# Patient Record
Sex: Female | Born: 1950 | Race: Black or African American | Hispanic: No | Smoking: Never smoker
Health system: Southern US, Community
[De-identification: ages and names within clinical notes are randomized; demographics above are authoritative.]

## PROBLEM LIST (undated history)

## (undated) DIAGNOSIS — E119 Type 2 diabetes mellitus without complications: Secondary | ICD-10-CM

## (undated) DIAGNOSIS — M1990 Unspecified osteoarthritis, unspecified site: Secondary | ICD-10-CM

## (undated) DIAGNOSIS — K219 Gastro-esophageal reflux disease without esophagitis: Secondary | ICD-10-CM

## (undated) DIAGNOSIS — T7840XA Allergy, unspecified, initial encounter: Secondary | ICD-10-CM

## (undated) DIAGNOSIS — J342 Deviated nasal septum: Secondary | ICD-10-CM

## (undated) DIAGNOSIS — I10 Essential (primary) hypertension: Secondary | ICD-10-CM

## (undated) HISTORY — DX: Type 2 diabetes mellitus without complications: E11.9

## (undated) HISTORY — DX: Allergy, unspecified, initial encounter: T78.40XA

## (undated) HISTORY — DX: Gastro-esophageal reflux disease without esophagitis: K21.9

## (undated) HISTORY — PX: TUBAL LIGATION: SHX77

---

## 2002-10-20 ENCOUNTER — Emergency Department (HOSPITAL_COMMUNITY): Admission: EM | Admit: 2002-10-20 | Discharge: 2002-10-21 | Payer: Self-pay | Admitting: Unknown Physician Specialty

## 2012-10-07 ENCOUNTER — Emergency Department (HOSPITAL_COMMUNITY)
Admission: EM | Admit: 2012-10-07 | Discharge: 2012-10-08 | Disposition: A | Discharge status: Home / Self Care | Payer: Self-pay | Attending: Emergency Medicine | Admitting: Emergency Medicine

## 2012-10-07 ENCOUNTER — Encounter (HOSPITAL_COMMUNITY): Payer: Self-pay | Admitting: *Deleted

## 2012-10-07 ENCOUNTER — Emergency Department (HOSPITAL_COMMUNITY): Payer: Self-pay

## 2012-10-07 DIAGNOSIS — R0989 Other specified symptoms and signs involving the circulatory and respiratory systems: Secondary | ICD-10-CM | POA: Insufficient documentation

## 2012-10-07 DIAGNOSIS — R059 Cough, unspecified: Secondary | ICD-10-CM | POA: Insufficient documentation

## 2012-10-07 DIAGNOSIS — J069 Acute upper respiratory infection, unspecified: Secondary | ICD-10-CM

## 2012-10-07 DIAGNOSIS — R0789 Other chest pain: Secondary | ICD-10-CM | POA: Insufficient documentation

## 2012-10-07 DIAGNOSIS — Z87898 Personal history of other specified conditions: Secondary | ICD-10-CM

## 2012-10-07 DIAGNOSIS — R062 Wheezing: Secondary | ICD-10-CM | POA: Insufficient documentation

## 2012-10-07 DIAGNOSIS — B9789 Other viral agents as the cause of diseases classified elsewhere: Secondary | ICD-10-CM | POA: Insufficient documentation

## 2012-10-07 DIAGNOSIS — R05 Cough: Secondary | ICD-10-CM | POA: Insufficient documentation

## 2012-10-07 MED ORDER — IPRATROPIUM BROMIDE 0.02 % IN SOLN
0.5000 mg | Freq: Once | RESPIRATORY_TRACT | Status: AC
  Administered 2012-10-07: 0.5 mg via RESPIRATORY_TRACT
  Filled 2012-10-07: qty 2.5

## 2012-10-07 MED ORDER — ALBUTEROL SULFATE (5 MG/ML) 0.5% IN NEBU
5.0000 mg | INHALATION_SOLUTION | Freq: Once | RESPIRATORY_TRACT | Status: AC
  Administered 2012-10-07: 5 mg via RESPIRATORY_TRACT
  Filled 2012-10-07: qty 40

## 2012-10-07 MED ORDER — PREDNISONE 20 MG PO TABS
60.0000 mg | ORAL_TABLET | Freq: Once | ORAL | Status: AC
  Administered 2012-10-07: 60 mg via ORAL
  Filled 2012-10-07: qty 3

## 2012-10-07 MED ORDER — ALBUTEROL SULFATE HFA 108 (90 BASE) MCG/ACT IN AERS: 2.0000 | INHALATION_SPRAY | RESPIRATORY_TRACT | Status: DC | PRN

## 2012-10-07 MED ORDER — ALBUTEROL SULFATE (5 MG/ML) 0.5% IN NEBU
5.0000 mg | INHALATION_SOLUTION | Freq: Once | RESPIRATORY_TRACT | Status: AC
  Administered 2012-10-07: 5 mg via RESPIRATORY_TRACT
  Filled 2012-10-07: qty 1

## 2012-10-07 MED ORDER — PREDNISONE 50 MG PO TABS: 50.0000 mg | ORAL_TABLET | Freq: Every day | ORAL | Status: AC

## 2012-10-07 NOTE — ED Notes (Signed)
The pt is c/o sob  For 2 days.  Audible wheezes.  No history of asthma only bronchitis

## 2012-10-07 NOTE — ED Notes (Signed)
Patient given copy of discharge paperwork; went over discharge instructions with patient.  Patient instructed to take albuterol inhaler and prednisone as directed; instructed to follow up with PCP early next week for re-evaluation, and to return to the ED for new, worsening, or concerning symptoms.

## 2012-10-07 NOTE — ED Provider Notes (Signed)
I saw and evaluated the patient, reviewed the resident's note and I agree with the findings and plan.  Patient with acute extirpation of wheezing that resolved with the nebulizer treatments in the emergency department also given some oral steroids patient is now completely asymptomatic and feels well can be discharged home.   Barbara Jakes, MD 10/07/12 [phone]

## 2012-10-07 NOTE — ED Provider Notes (Signed)
History     CSN: [id number]  Arrival date & time 10/07/12  2046   First MD Initiated Contact with Patient 10/07/12 2113      Chief Complaint  Patient presents with  . Shortness of Breath   HPI: Barbara Adkins is a 61 yo AAF with history of HTN presents with nasal congestion and worsening SOB for one week. Symptoms started with nasal congestion and non-productive cough. These symptoms have progressively worsened. For the last three days her shortness of breath has worsened. Today she was unable to speak in full sentences so she presents for evaluation. She has been taking Mucinex without relief of symptoms. She denies CP, orthopnea, leg swelling, nausea or vomiting. She does not smoke. Has a family history of asthma. She has wheezed in the past, most recently 8 or 9 years ago.   History reviewed. No pertinent past medical history.  History reviewed. No pertinent past surgical history.  No family history on file.  History  Substance Use Topics  . Smoking status: Never Smoker   . Smokeless tobacco: Not on file  . Alcohol Use: No    OB History    Grav Para Term Preterm Abortions TAB SAB Ect Mult Living                 Review of Systems  Constitutional: Negative for fever, chills, appetite change and fatigue.  HENT: Positive for congestion. Negative for rhinorrhea, sneezing, trouble swallowing and postnasal drip.   Eyes: Negative for photophobia and visual disturbance.  Respiratory: Positive for cough, chest tightness, shortness of breath and wheezing.   Cardiovascular: Negative for chest pain and leg swelling.  Gastrointestinal: Negative for nausea, vomiting, abdominal pain and diarrhea.  Genitourinary: Negative for dysuria, urgency, decreased urine volume and difficulty urinating.  Musculoskeletal: Negative for myalgias and arthralgias.  Skin: Negative for color change and wound.  Neurological: Negative for dizziness, seizures and headaches.  Psychiatric/Behavioral: Negative for  confusion and agitation.  All other systems reviewed and are negative.   Allergies  Review of patient's allergies indicates not on file.  Home Medications  No current outpatient prescriptions on file.  BP 203/127  Temp 98.1 F (36.7 C) (Oral)  Resp 28  SpO2 97%  Physical Exam  Constitutional: She appears well-developed and well-nourished. She is cooperative. No distress.  HENT:  Head: Normocephalic and atraumatic.  Mouth/Throat: Oropharynx is clear and moist and mucous membranes are normal.  Eyes: Conjunctivae normal and EOM are normal. Pupils are equal, round, and reactive to light.  Neck: Trachea normal. No JVD present.  Cardiovascular: Normal rate, regular rhythm, S1 normal, S2 normal and normal heart sounds.  Exam reveals no decreased pulses.   Pulmonary/Chest: Accessory muscle usage present. Tachypnea noted. She has no decreased breath sounds. She has wheezes (diffuse).  Abdominal: Soft. Normal appearance and bowel sounds are normal. There is no tenderness.  Neurological: She is alert.    ED Course  Procedures   Labs Reviewed - No data to display Dg Chest 2 View  10/07/2012  *RADIOLOGY REPORT*  Clinical Data: Shortness of breath and congestion for 3 days. Wheezing.  CHEST - 2 VIEW  Comparison: None.  Findings: The heart size and pulmonary vascularity are normal. The lungs appear clear and expanded without focal air space disease or consolidation. No blunting of the costophrenic angles.  No pneumothorax.  Mediastinal contours appear intact.  Mild degenerative changes in the thoracic spine.  IMPRESSION: No evidence of active pulmonary disease.   Original Report  Authenticated By: Burman Nieves, M.D.    1. History of wheezing   2. Upper respiratory virus     MDM  61 yo AAF with history of HTN, previous episodes of wheezing in the past presents with URI symptoms and SOB. Afebrile, hypertensive, adequate oxygenation. Symptoms likely related to viral induced wheezing. Doubt  CHF as no LE edema, CXR without edema. Doubt PNA as afebrile, no consolidation on CXR. Does have history of wheezing in the past. Treated with Duoneb x2 and prednisone 60 mg oral with marked improvement of her symptoms. Wheezing resolved and WOB improved. She felt better as well. Her blood pressure improved without intervention. Her oxygenation remained in the high 90's on room air. Felt she was stable for outpatient management as symptoms have improved, normal oxygenation, no significant abnormality on CXR. Will have her continue to use albuterol 4 puffs every 4 hours for the next 24 hours and prednisone burst. Return precautions to include worsening SOB, fever, chest pain or other concerning symptoms. She is in agreement with plan and voiced understanding.   Reviewed imaging, labs and previous medical records, utilized in MDM  Clinical Impression 1. Viral induced wheezing        Margie Billet, MD 10/08/12 [phone]

## 2012-10-07 NOTE — ED Notes (Signed)
Received bedside report from [place], [place].  Patient currently sitting up in bed; no respiratory or acute distress noted.  Patient updated on plan of care; informed patient that we are currently waiting on discharge paperwork form EDP; patient denies any other needs at this time; will continue to monitor.

## 2012-10-08 NOTE — ED Provider Notes (Signed)
I saw and evaluated the patient, reviewed the resident's note and I agree with the findings and plan.   Shelda Jakes, MD 10/08/12 [phone]

## 2014-03-02 ENCOUNTER — Emergency Department (HOSPITAL_COMMUNITY)
Admission: EM | Admit: 2014-03-02 | Discharge: 2014-03-03 | Disposition: A | Discharge status: Home / Self Care | Payer: Self-pay | Attending: Emergency Medicine | Admitting: Emergency Medicine

## 2014-03-02 ENCOUNTER — Encounter (HOSPITAL_COMMUNITY): Payer: Self-pay | Admitting: Emergency Medicine

## 2014-03-02 DIAGNOSIS — R059 Cough, unspecified: Secondary | ICD-10-CM | POA: Insufficient documentation

## 2014-03-02 DIAGNOSIS — Z760 Encounter for issue of repeat prescription: Secondary | ICD-10-CM | POA: Insufficient documentation

## 2014-03-02 DIAGNOSIS — R05 Cough: Secondary | ICD-10-CM | POA: Insufficient documentation

## 2014-03-02 NOTE — ED Provider Notes (Signed)
CSN: [id number]     Arrival date & time 03/02/14  2314 History  This chart was scribed for non-physician practitioner working with No att. providers found by Mercy Moore, ED Scribe. This patient was seen in room TR09C/TR09C and the patient's care was started at 11:35 PM.   Chief Complaint  Patient presents with  . Medication Refill  . Cough      The history is provided by the patient. No language interpreter was used.   HPI Comments: Barbara Adkins is a 63 y.o. female who presents to the Emergency Department requesting prescription for her albuterol inhaler. Her granddaughter is being seen in pediatrics, and so she decided to come to the adult side to get a prescription refill. She denies any current shortness of breath, or wheezing. Denies any fevers, chills, chest pain, or abdominal pain. She states that she has had some allergies lately because of the pollen, but currently feels well.  No past medical history on file. No past surgical history on file. No family history on file. History  Substance Use Topics  . Smoking status: Never Smoker   . Smokeless tobacco: Not on file  . Alcohol Use: No   OB History   Grav Para Term Preterm Abortions TAB SAB Ect Mult Living                 Review of Systems  Constitutional: Negative for fever and chills.  Respiratory: Negative for cough, shortness of breath and wheezing.   Cardiovascular: Negative for chest pain.  Gastrointestinal: Negative for nausea, vomiting, diarrhea and constipation.  Genitourinary: Negative for dysuria.      Allergies  Review of patient's allergies indicates no known allergies.  Home Medications   Prior to Admission medications   Medication Sig Start Date End Date Taking? Authorizing Provider  albuterol (PROVENTIL HFA;VENTOLIN HFA) 108 (90 BASE) MCG/ACT inhaler Inhale 2 puffs into the lungs every 4 (four) hours as needed for wheezing. 10/07/12   Louretta Shorten, MD   Triage Vitals: BP 184/128  Pulse  79  Temp(Src) 98.3 F (36.8 C) (Oral)  Resp 20  Ht 5\' 4"  (1.626 m)  Wt 206 lb 7 oz (93.639 kg)  BMI 35.42 kg/m2  SpO2 94% Physical Exam  Nursing note and vitals reviewed. Constitutional: She is oriented to person, place, and time. She appears well-developed and well-nourished. No distress.  HENT:  Head: Normocephalic and atraumatic.  Eyes: Conjunctivae and EOM are normal. Pupils are equal, round, and reactive to light.  Neck: Normal range of motion. Neck supple. No tracheal deviation present.  Cardiovascular: Normal rate and regular rhythm.  Exam reveals no gallop and no friction rub.   No murmur heard. Pulmonary/Chest: Effort normal and breath sounds normal. No respiratory distress. She has no wheezes. She has no rales. She exhibits no tenderness.  CTAB  Abdominal: Soft. She exhibits no distension and no mass. There is no tenderness. There is no rebound and no guarding.  Musculoskeletal: Normal range of motion. She exhibits no edema and no tenderness.  Neurological: She is alert and oriented to person, place, and time.  Skin: Skin is warm and dry.  Psychiatric: She has a normal mood and affect. Her behavior is normal. Judgment and thought content normal.    ED Course  Procedures (including critical care time) DIAGNOSTIC STUDIES: Oxygen Saturation is 94% on room air, adequate by my interpretation.    COORDINATION OF CARE: 11:36 PM- Discussed treatment plan with patient at bedside and patient agreed to  plan.     Labs Review Labs Reviewed - No data to display  Imaging Review No results found.   EKG Interpretation None      MDM   Final diagnoses:  Medication refill    Patient requesting albuterol inhaler refilled. She denies any symptoms at this time. She states that she has had some allergies over the past week or so, but currently feels well. Her granddaughter is being seen in pediatrics currently, so she decided to come over for a prescription  refill.  Additionally, her blood pressure is elevated, but she has been noncompliant with her medications.  She denies any headache, vision problems, lightheadedness, chest pain, or any other symptoms. I instructed her to followup with a primary care provider, and have given her the resource guide.  I personally performed the services described in this documentation, which was scribed in my presence. The recorded information has been reviewed and is accurate.     Montine Circle, PA-C 03/03/14 [phone]

## 2014-03-02 NOTE — ED Notes (Signed)
Pt requesting a medication refill of an albuterol inhaler. Pt A&Ox4, respirations equal and unlabored, skin warm and dry

## 2014-03-03 MED ORDER — ALBUTEROL SULFATE HFA 108 (90 BASE) MCG/ACT IN AERS
2.0000 | INHALATION_SPRAY | RESPIRATORY_TRACT | Status: DC | PRN
  Administered 2014-03-03: 2 via RESPIRATORY_TRACT
  Filled 2014-03-03: qty 6.7

## 2014-03-03 MED ORDER — ALBUTEROL SULFATE HFA 108 (90 BASE) MCG/ACT IN AERS: 2.0000 | INHALATION_SPRAY | RESPIRATORY_TRACT | Status: DC | PRN

## 2014-03-03 NOTE — Discharge Instructions (Signed)
Please see a primary care doctor.  If you do not have a primary care, please see the list below.  You blood pressure is elevated, and it is important that you start taking medication for this.  This medication should be managed by a primary care provider.  Medication Refill, Emergency Department We have refilled your medication today as a courtesy to you. It is best for your medical care, however, to take care of getting refills done through your primary caregiver's office. They have your records and can do a better job of follow-up than we can in the emergency department. On maintenance medications, we often only prescribe enough medications to get you by until you are able to see your regular caregiver. This is a more expensive way to refill medications. In the future, please plan for refills so that you will not have to use the emergency department for this. Thank you for your help. Your help allows [place] to better take care of the daily emergencies that enter our department. Document Released: 02/18/2004 Document Revised: 01/24/2012 Document Reviewed: 11/01/2005 [facility] Patient Information 2014 [place], [place].  Emergency Department Resource Guide 1) Find a Doctor and Pay Out of Pocket Although you won't have to find out who is covered by your insurance plan, it is a good idea to ask around and get recommendations. You will then need to call the office and see if the doctor you have chosen will accept you as a new patient and what types of options they offer for patients who are self-pay. Some doctors offer discounts or will set up payment plans for their patients who do not have insurance, but you will need to ask so you aren't surprised when you get to your appointment.  2) Contact Your Local Health Department Not all health departments have doctors that can see patients for sick visits, but many do, so it is worth a call to see if yours does. If you don't know where your local health department  is, you can check in your phone book. The CDC also has a tool to help you locate your state's health department, and many state websites also have listings of all of their local health departments.  3) Find a [facility] If your illness is not likely to be very severe or complicated, you may want to try a walk in clinic. These are popping up all over the country in pharmacies, drugstores, and shopping centers. They're usually staffed by nurse practitioners or physician assistants that have been trained to treat common illnesses and complaints. They're usually fairly quick and inexpensive. However, if you have serious medical issues or chronic medical problems, these are probably not your best option.  No Primary Care Doctor: - Call Health Connect at  [phone] - they can help you locate a primary care doctor that  accepts your insurance, provides certain services, etc. - Physician Referral Service- [phone]  Chronic Pain Problems: Organization         Address  Phone   Notes  [facility]  [phone] Patients need to be referred by their primary care doctor.   Medication Assistance: Organization         Address  Phone   Notes  [facility] Medication [facility] [place] [place], [place] [address] [phone] --Must be a resident of [facility] -- Must have NO insurance coverage whatsoever (no Medicaid/ Medicare, etc.) -- The pt. MUST have a primary care doctor that  directs their care regularly and follows them in the community   MedAssist  [phone]   [employer]  [phone]    Agencies that provide inexpensive medical care: Organization         Address  Phone   Notes  [place]  [phone]   [facility]    [phone]   [facility] [place], [place] [address] [phone]   [place] [address], [place]  [phone]   Planned Parenthood    [phone]   [facility]    [phone]   [place] and [address], [place] Phone:  [phone], Fax:  [phone] Hours of Operation:  9 am - 6 pm, M-F.  Also accepts Medicaid/Medicare and self-pay.  [facility] for [place], [address], [place] Phone: [phone], Fax: [phone]. Hours of Operation:  8:30 am - 5:30 pm, M-F.  Also accepts Medicaid and self-pay.  [facility] [place] [address] [place] Phone: [phone]   [place], [place], [place] [phone], Ext. [phone] Mondays & Thursdays: 7-9 AM.  First 15 patients are seen on a first come, first serve basis.    [place] Providers:  Organization         Address  Phone   Notes  [facility] [address], [place] [phone] Also accepts self-pay patients.  [facility] [address], [place]  [phone]   [place], [address], [place] [phone]   [facility] [address] [place] [phone]   Lucianne Lei [address], [place]   [phone] Only accepts [place] Access [place] patients after they have their name applied to their card.   Self-Pay (no insurance) in [facility]:  Organization         Address  Phone   Notes  Sickle Cell Patients, [facility] [place] [phone]   [facility] [place] [phone]   [facility] [place]  [place], [place], [place] [phone]   [facility]/Dr. Osei-Bonsu  [address], [place] or [address], [place] [phone] Phone number for both [place] and [place]  locations is the same.  Urgent Medical and [facility] [address], [place] [phone]   [facility] [address], [place] or [address] [phone]   [facility] [address], [place] [phone], phone; [phone], fax Sees patients 1st and 3rd Saturday of every month.  Must not qualify for public or private insurance (i.e. Medicaid, Medicare, [place] Health Choice, Veterans' Benefits)  Household income should be no more than 200% of the poverty level The clinic cannot treat you if you are pregnant or think you are pregnant  Sexually transmitted diseases are not treated at the clinic.    Dental Care: Organization         Address  Phone  Notes  [facility] [address] [place], [place] [phone] Accepts children up  to age 65 who are enrolled in Medicaid or [place] Health Choice; pregnant women with a Medicaid card; and children who have applied for Medicaid or [place] Health Choice, but were declined, whose parents can pay a reduced fee at time of service.  [facility] Department of [facility]  [address], [place] [phone] Accepts children up to age 70 who are enrolled in [place] or [place]; pregnant women with a Medicaid card; and children who have applied for Medicaid or [place] Health Choice, but were declined, whose parents can pay a reduced fee at time of service.  [place] Adult [facility] Access PROGRAM  [place] [phone] Patients are seen by appointment only. Walk-ins are not accepted. [place] will see patients 31 years of age and older. Monday - Tuesday (8am-5pm) Most Wednesdays (8:30-5pm) $30 per visit, cash only  [facility] Adult Dental Access PROGRAM  [address], [facility] [phone] Patients are seen by appointment only. Walk-ins are not accepted. [place]  will see patients 85 years of age and older. One Wednesday Evening (Monthly: Volunteer Based).  $30 per visit, cash only  [place]  [phone] for adults; Children under age 63, call Graduate Pediatric Dentistry at [phone]. Children aged 32-14, please call [phone] to request a pediatric application.  Dental services are provided in all areas of dental care including fillings, crowns and bridges, complete and partial dentures, implants, gum treatment, root canals, and extractions. Preventive care is also provided. Treatment is provided to both adults and children. Patients are selected via a lottery and there is often a waiting list.   [facility] [place] [place] [address], [place]  [phone] [email or web address]   [facility] [address] [place], [place] [phone], Ext. [phone] Second and Fourth Thursday of each month, opens at 6:30 AM; Clinic ends at 9 AM.  Patients are seen on a first-come first-served basis, and a limited number are seen during each clinic.   [facility]  [address] Hillard Danker [place], [place] [phone]   Eligibility Requirements You must have lived in [place], [place], or [place] counties for at least the last three months.   You cannot be eligible for state or federal sponsored [employer], including [employer], [place], or [employer].   You generally cannot be eligible for healthcare insurance through your employer.    How to apply: Eligibility screenings are held every Tuesday and Wednesday afternoon from 1:00 pm until 4:00 pm. You do not need an appointment for the interview!  [facility] [address], [place], [place]   [place]  [place] Department  [place]  [phone]    Behavioral Health Resources in the Community: Intensive Outpatient  Programs Organization         Address  Phone  Notes  [place]. [address] [place], [place] [phone]   [facility] Outpatient [address] [place], [place]   ADS: Alcohol & Drug Svcs [address], [place], [place]   [place] [address],  [place], [place] or [phone]   Substance Abuse Resources Organization         Address  Phone  Notes  Alcohol and Drug Services  [phone]   [facility]  [phone]   [facility] [place]   Chinita Pester  [phone]   Residential & Outpatient Substance Abuse Program  [phone]   Psychological Services Organization         Address  Phone  Notes  [facility] [place]  [place]  [phone]   [place] [address], [place] or [phone]    Mobile Crisis Teams Organization         Address  Phone  Notes  [facility], Mobile Crisis Care Unit  [phone]   [facility] Psych[facility] Services  [address] [place], [place]   [place] Levels [address], [place] [phone]    Self-Help/Support Groups Organization         Address  Phone             Notes  Success. of [place] - variety of support groups  [place] Call for more information  Narcotics Anonymous (NA), [facility] [address], [employer] [place]  2 meetings at this location   Special educational needs teacher         Address  Phone  Notes  ASAP Residential Treatment [place],    [place]  [phone]   [facility]  [address] [place] [phone], [place], [place]   [place], [place] [phone] Admissions: 8am-3pm M-F  Incentives Substance [place] [address],    [place], [place]  [phone]   [facility] [address] [place], [place], [place]   [facility] [address],  [place], [place]   [facility]ight Programs - Intensive Outpatient [address], [place], [place]   [facility] [place].,  [place], [place] [phone] or [phone]   Residential Treatment Services (RTS) [address], [place], [place] Accepts Medicaid  [facility] [place] [address],  [place] [phone] Substance Abuse/Addiction Treatment   [facility] Organization         Address  Phone  Notes  [facility]  [phone]   Domenic Schwab, PhD [address] Arlis Porta [place], [place]   [phone] or [phone]   [place], [place] [phone]   [facility] [address] [place], [place] [phone] Insurance/Medicaid/sponsorship through [facility] and Families [address]                                    [place], [place] [phone] [place] [address], [place] [phone]    Dr. Adele Schilder  [phone]   [facility] Dept. 1) [address], [place] 2) [place] 3)  [place] [address], [place] [phone]  [phone]   [place] [phone] or [phone] (After Hours)

## 2014-03-04 NOTE — ED Provider Notes (Signed)
Medical screening examination/treatment/procedure(s) were performed by non-physician practitioner and as supervising physician I was immediately available for consultation/collaboration.   Teressa Lower, MD 03/04/14 [phone]

## 2017-03-09 ENCOUNTER — Ambulatory Visit (HOSPITAL_COMMUNITY)
Admission: EM | Admit: 2017-03-09 | Discharge: 2017-03-09 | Disposition: A | Discharge status: Home / Self Care | Payer: Medicare HMO | Attending: Family Medicine | Admitting: Family Medicine

## 2017-03-09 ENCOUNTER — Encounter (HOSPITAL_COMMUNITY): Payer: Self-pay | Admitting: Emergency Medicine

## 2017-03-09 DIAGNOSIS — I10 Essential (primary) hypertension: Secondary | ICD-10-CM | POA: Diagnosis not present

## 2017-03-09 DIAGNOSIS — R05 Cough: Secondary | ICD-10-CM | POA: Diagnosis not present

## 2017-03-09 DIAGNOSIS — R0602 Shortness of breath: Secondary | ICD-10-CM | POA: Diagnosis not present

## 2017-03-09 DIAGNOSIS — J4520 Mild intermittent asthma, uncomplicated: Secondary | ICD-10-CM

## 2017-03-09 MED ORDER — ALBUTEROL SULFATE HFA 108 (90 BASE) MCG/ACT IN AERS: 2.0000 | INHALATION_SPRAY | RESPIRATORY_TRACT | 11 refills | Status: DC | PRN

## 2017-03-09 MED ORDER — PREDNISONE 20 MG PO TABS
ORAL_TABLET | ORAL | 0 refills | Status: DC
Start: 2017-03-09 — End: 2017-10-27

## 2017-03-09 MED ORDER — AMLODIPINE BESYLATE 5 MG PO TABS: 5.0000 mg | ORAL_TABLET | Freq: Every day | ORAL | 3 refills | Status: DC

## 2017-03-09 MED ORDER — ALBUTEROL SULFATE (2.5 MG/3ML) 0.083% IN NEBU
INHALATION_SOLUTION | RESPIRATORY_TRACT | Status: AC
  Filled 2017-03-09: qty 3

## 2017-03-09 MED ORDER — ALBUTEROL SULFATE (2.5 MG/3ML) 0.083% IN NEBU
2.5000 mg | INHALATION_SOLUTION | Freq: Once | RESPIRATORY_TRACT | Status: AC
  Administered 2017-03-09: 2.5 mg via RESPIRATORY_TRACT

## 2017-03-09 MED FILL — AMLODIPINE BESYLATE 5 MG TA: 5 | 90 days supply | Qty: 90 | Fill #0

## 2017-03-09 MED FILL — predniSONE 20 MG TABS: 20 | 5 days supply | Qty: 10 | Fill #0

## 2017-03-09 MED FILL — VENTOLIN HFA 90 MCG INHALER: 108 (90 BAS | 25 days supply | Qty: 18 | Fill #0
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
## 2017-03-09 NOTE — Discharge Instructions (Signed)
Return if symptoms do not clear in the next day or so
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
## 2017-06-06 MED FILL — AMLODIPINE BESYLATE 5 MG TA: 5 | 90 days supply | Qty: 90 | Fill #1

## 2017-06-06 MED FILL — VENTOLIN HFA 90 MCG INHALER: 108 (90 BAS | 25 days supply | Qty: 18 | Fill #1

## 2017-06-08 DIAGNOSIS — R69 Illness, unspecified: Secondary | ICD-10-CM | POA: Diagnosis not present

## 2017-06-08 MED FILL — OXYCODONE W/APAP 5/325 TAB: 5-325 | 3 days supply | Qty: 30 | Fill #0

## 2017-06-27 MED FILL — AMOXICILLIN 500 MG CAPSULE: 500 | 7 days supply | Qty: 21 | Fill #0

## 2017-08-17 MED FILL — VENTOLIN HFA 90 MCG INHALER: 108 (90 BAS | 25 days supply | Qty: 18 | Fill #2

## 2017-09-05 MED FILL — VENTOLIN HFA 90 MCG INHALER: 108 (90 BAS | 25 days supply | Qty: 18 | Fill #3

## 2017-09-05 MED FILL — AMLODIPINE BESYLATE 5 MG TA: 5 | 90 days supply | Qty: 90 | Fill #2

## 2017-10-27 ENCOUNTER — Ambulatory Visit (HOSPITAL_COMMUNITY)
Admission: EM | Admit: 2017-10-27 | Discharge: 2017-10-27 | Disposition: A | Discharge status: Home / Self Care | Payer: Medicare HMO | Attending: Urgent Care | Admitting: Urgent Care

## 2017-10-27 ENCOUNTER — Encounter (HOSPITAL_COMMUNITY): Payer: Self-pay | Admitting: Family Medicine

## 2017-10-27 DIAGNOSIS — M25442 Effusion, left hand: Secondary | ICD-10-CM

## 2017-10-27 DIAGNOSIS — M7989 Other specified soft tissue disorders: Secondary | ICD-10-CM | POA: Diagnosis not present

## 2017-10-27 DIAGNOSIS — R2232 Localized swelling, mass and lump, left upper limb: Secondary | ICD-10-CM | POA: Diagnosis not present

## 2017-10-27 DIAGNOSIS — R030 Elevated blood-pressure reading, without diagnosis of hypertension: Secondary | ICD-10-CM

## 2017-10-27 DIAGNOSIS — R05 Cough: Secondary | ICD-10-CM | POA: Diagnosis not present

## 2017-10-27 DIAGNOSIS — R059 Cough, unspecified: Secondary | ICD-10-CM

## 2017-10-27 DIAGNOSIS — I10 Essential (primary) hypertension: Secondary | ICD-10-CM | POA: Diagnosis not present

## 2017-10-27 DIAGNOSIS — M79645 Pain in left finger(s): Secondary | ICD-10-CM | POA: Insufficient documentation

## 2017-10-27 HISTORY — DX: Essential (primary) hypertension: I10

## 2017-10-27 LAB — URIC ACID: URIC ACID, SERUM: 6.2 mg/dL (ref 2.3–6.6)

## 2017-10-27 MED ORDER — PREDNISONE 20 MG PO TABS: ORAL_TABLET | ORAL | 0 refills | Status: DC

## 2017-10-27 MED ORDER — AZITHROMYCIN 250 MG PO TABS: 250.0000 mg | ORAL_TABLET | Freq: Every day | ORAL | 0 refills | Status: DC

## 2017-10-27 MED ORDER — BENZONATATE 100 MG PO CAPS: 100.0000 mg | ORAL_CAPSULE | Freq: Three times a day (TID) | ORAL | 0 refills | Status: DC | PRN

## 2017-10-27 MED FILL — BENZONATATE 100 MG CAPS: 100 | 10 days supply | Qty: 60 | Fill #0

## 2017-10-27 MED FILL — AZITHROMYCIN 250 MG TABLET: 250 | 5 days supply | Qty: 6 | Fill #0

## 2017-10-27 MED FILL — predniSONE 20 MG TABS: 20 | 5 days supply | Qty: 10 | Fill #0

## 2017-10-27 NOTE — ED Triage Notes (Signed)
Pt here for left hand swelling since yesterday. Denies any injury. Swelling in digits. Denise any medication for pain.

## 2017-10-27 NOTE — Discharge Instructions (Addendum)
Take 2 pills of amlodipine daily. Recheck with me, Barbara Adkins, at my primary care clinic, [facility], in 1 week. Call our office to set up an appointment, [phone].   For sore throat try using a honey-based tea. Use 3 teaspoons of honey with juice squeezed from half lemon. Place shaved pieces of ginger into 1/2-1 cup of water and warm over stove top. Then mix the ingredients and repeat every 4 hours as needed.

## 2017-10-27 NOTE — ED Provider Notes (Signed)
  MRN: [id number] DOB: Dec 14, 1950  Subjective:   Barbara Adkins is a 66 y.o. female presenting for 2 day history of left pinky pain, swelling, redness, warmth. Pain is worsening, throbbing in nature, constant, worse with touch. Has not tried any medications. Denies trauma, history of gout. Reports 3 week history of chest congestion, mildly productive cough. Initially had sore throat (now improved), runny nose, sinus pressure. Has used multiple otc medications. Denies fever, sinus pain, ear pain. Has a history of HTN, managed with amlodipine. Regarding BP, denies chronic headache, chest pain, shortness of breath, heart racing, palpitations, nausea, vomiting, abdominal pain, hematuria, lower leg swelling, dyspnea, orthopnea. Denies smoking cigarettes.   No current facility-administered medications for this encounter.   Current Outpatient Medications:  .  albuterol (PROVENTIL HFA;VENTOLIN HFA) 108 (90 Base) MCG/ACT inhaler, Inhale 2 puffs into the lungs every 4 (four) hours as needed for wheezing or shortness of breath (cough, shortness of breath or wheezing.)., Disp: 1 Inhaler, Rfl: 11 .  amLODipine (NORVASC) 5 MG tablet, Take 1 tablet (5 mg total) by mouth daily., Disp: 90 tablet, Rfl: 3 .  Fexofenadine HCl (MUCINEX ALLERGY PO), Take by mouth., Disp: , Rfl:    Barbara Adkins  has No Known Allergies.  Barbara Adkins  has a past medical history of Hypertension. Denies past surgical history.  Objective:   Vitals: BP (!) 162/106 (BP Location: Right Arm)   Pulse 65   Temp 98.3 F (36.8 C) (Oral)   Resp 18   SpO2 96%   BP Readings from Last 3 Encounters:  10/27/17 (!) 162/106  03/09/17 (!) 173/98  03/03/14 (!) 198/118    Physical Exam  Constitutional: She is oriented to person, place, and time. She appears well-developed and well-nourished.  HENT:  Mouth/Throat: Oropharynx is clear and moist.  Neck: Normal range of motion. Neck supple.  Cardiovascular: Normal rate, regular rhythm and intact  distal pulses. Exam reveals no gallop and no friction rub.  No murmur heard. Pulmonary/Chest: No respiratory distress. She has wheezes (and rhonchi bilaterally). She has no rales.  Lymphadenopathy:    She has no cervical adenopathy.  Neurological: She is alert and oriented to person, place, and time.  Skin: Skin is warm and dry.  Psychiatric: She has a normal mood and affect.   Assessment and Plan :   Finger pain, left  Finger joint swelling, left  Essential hypertension  Elevated blood pressure reading  Cough  Will manage her finger pain as inflammatory process with prednisone. I advised patient to not use any NSAID until we can establish her kidney function. Prednisone will likely help with her cough as well. Will address respiratory infection with azithromycin. Patient is to increase her amlodipine to 10mg  daily. Counseled on risks of uncontrolled HTN. She agreed to start managing this more aggressively. ER and return-to-clinic precautions discussed, patient verbalized understanding. Otherwise, she is to f/u with me in 1 week.  Jaynee Eagles, PA-C [facility]  10/27/2017  2:02 PM   Jaynee Eagles, PA-C 10/27/17 1600

## 2017-11-01 ENCOUNTER — Ambulatory Visit (INDEPENDENT_AMBULATORY_CARE_PROVIDER_SITE_OTHER): Payer: Medicare HMO | Admitting: Urgent Care

## 2017-11-01 ENCOUNTER — Encounter: Payer: Self-pay | Admitting: Urgent Care

## 2017-11-01 VITALS — BP 146/80 | HR 77 | Temp 98.4°F | Resp 16 | Ht 64.0 in | Wt 197.8 lb

## 2017-11-01 DIAGNOSIS — Z6833 Body mass index (BMI) 33.0-33.9, adult: Secondary | ICD-10-CM

## 2017-11-01 DIAGNOSIS — R030 Elevated blood-pressure reading, without diagnosis of hypertension: Secondary | ICD-10-CM | POA: Diagnosis not present

## 2017-11-01 DIAGNOSIS — Z23 Encounter for immunization: Secondary | ICD-10-CM

## 2017-11-01 DIAGNOSIS — I10 Essential (primary) hypertension: Secondary | ICD-10-CM | POA: Diagnosis not present

## 2017-11-01 DIAGNOSIS — E669 Obesity, unspecified: Secondary | ICD-10-CM | POA: Diagnosis not present

## 2017-11-01 MED ORDER — AMLODIPINE BESYLATE 10 MG PO TABS: 10.0000 mg | ORAL_TABLET | Freq: Every day | ORAL | 1 refills | Status: DC

## 2017-11-01 MED ORDER — LOSARTAN POTASSIUM 50 MG PO TABS: 50.0000 mg | ORAL_TABLET | Freq: Every day | ORAL | 1 refills | Status: DC

## 2017-11-01 MED FILL — LOSARTAN POTASSIUM 50 MG TA: 50 | 90 days supply | Qty: 90 | Fill #0

## 2017-11-01 NOTE — Patient Instructions (Addendum)
   IF you received an x-ray today, you will receive an invoice from [facility]. Please contact [facility] at [phone] with questions or concerns regarding your invoice.   IF you received labwork today, you will receive an invoice from [facility]. Please contact [facility] at [phone] with questions or concerns regarding your invoice.   Our billing staff will not be able to assist you with questions regarding bills from these companies.  You will be contacted with the lab results as soon as they are available. The fastest way to get your results is to activate your My Chart account. Instructions are located on the last page of this paperwork. If you have not heard from us regarding the results in 2 weeks, please contact this office.     Hypertension Hypertension, commonly called high blood pressure, is when the force of blood pumping through the arteries is too strong. The arteries are the blood vessels that carry blood from the heart throughout the body. Hypertension forces the heart to work harder to pump blood and may cause arteries to become narrow or stiff. Having untreated or uncontrolled hypertension can cause heart attacks, strokes, kidney disease, and other problems. A blood pressure reading consists of a higher number over a lower number. Ideally, your blood pressure should be below 120/80. The first ("top") number is called the systolic pressure. It is a measure of the pressure in your arteries as your heart beats. The second ("bottom") number is called the diastolic pressure. It is a measure of the pressure in your arteries as the heart relaxes. What are the causes? The cause of this condition is not known. What increases the risk? Some risk factors for high blood pressure are under your control. Others are not. Factors you can change  Smoking.  Having type 2 diabetes mellitus, high cholesterol, or both.  Not getting enough exercise or physical  activity.  Being overweight.  Having too much fat, sugar, calories, or salt (sodium) in your diet.  Drinking too much alcohol. Factors that are difficult or impossible to change  Having chronic kidney disease.  Having a family history of high blood pressure.  Age. Risk increases with age.  Race. You may be at higher risk if you are African-American.  Gender. Men are at higher risk than women before age 45. After age 65, women are at higher risk than men.  Having obstructive sleep apnea.  Stress. What are the signs or symptoms? Extremely high blood pressure (hypertensive crisis) may cause:  Headache.  Anxiety.  Shortness of breath.  Nosebleed.  Nausea and vomiting.  Severe chest pain.  Jerky movements you cannot control (seizures).  How is this diagnosed? This condition is diagnosed by measuring your blood pressure while you are seated, with your arm resting on a surface. The cuff of the blood pressure monitor will be placed directly against the skin of your upper arm at the level of your heart. It should be measured at least twice using the same arm. Certain conditions can cause a difference in blood pressure between your right and left arms. Certain factors can cause blood pressure readings to be lower or higher than normal (elevated) for a short period of time:  When your blood pressure is higher when you are in a health care provider's office than when you are at home, this is called white coat hypertension. Most people with this condition do not need medicines.  When your blood pressure is higher at home than when you   are in a health care provider's office, this is called masked hypertension. Most people with this condition may need medicines to control blood pressure.  If you have a high blood pressure reading during one visit or you have normal blood pressure with other risk factors:  You may be asked to return on a different day to have your blood pressure  checked again.  You may be asked to monitor your blood pressure at home for 1 week or longer.  If you are diagnosed with hypertension, you may have other blood or imaging tests to help your health care provider understand your overall risk for other conditions. How is this treated? This condition is treated by making healthy lifestyle changes, such as eating healthy foods, exercising more, and reducing your alcohol intake. Your health care provider may prescribe medicine if lifestyle changes are not enough to get your blood pressure under control, and if:  Your systolic blood pressure is above 130.  Your diastolic blood pressure is above 80.  Your personal target blood pressure may vary depending on your medical conditions, your age, and other factors. Follow these instructions at home: Eating and drinking  Eat a diet that is high in fiber and potassium, and low in sodium, added sugar, and fat. An example eating plan is called the DASH (Dietary Approaches to Stop Hypertension) diet. To eat this way: ? Eat plenty of fresh fruits and vegetables. Try to fill half of your plate at each meal with fruits and vegetables. ? Eat whole grains, such as whole wheat pasta, brown rice, or whole grain bread. Fill about one quarter of your plate with whole grains. ? Eat or drink low-fat dairy products, such as skim milk or low-fat yogurt. ? Avoid fatty cuts of meat, processed or cured meats, and poultry with skin. Fill about one quarter of your plate with lean proteins, such as fish, chicken without skin, beans, eggs, and tofu. ? Avoid premade and processed foods. These tend to be higher in sodium, added sugar, and fat.  Reduce your daily sodium intake. Most people with hypertension should eat less than 1,500 mg of sodium a day.  Limit alcohol intake to no more than 1 drink a day for nonpregnant women and 2 drinks a day for men. One drink equals 12 oz of beer, 5 oz of wine, or 1 oz of hard  liquor. Lifestyle  Work with your health care provider to maintain a healthy body weight or to lose weight. Ask what an ideal weight is for you.  Get at least 30 minutes of exercise that causes your heart to beat faster (aerobic exercise) most days of the week. Activities may include walking, swimming, or biking.  Include exercise to strengthen your muscles (resistance exercise), such as pilates or lifting weights, as part of your weekly exercise routine. Try to do these types of exercises for 30 minutes at least 3 days a week.  Do not use any products that contain nicotine or tobacco, such as cigarettes and e-cigarettes. If you need help quitting, ask your health care provider.  Monitor your blood pressure at home as told by your health care provider.  Keep all follow-up visits as told by your health care provider. This is important. Medicines  Take over-the-counter and prescription medicines only as told by your health care provider. Follow directions carefully. Blood pressure medicines must be taken as prescribed.  Do not skip doses of blood pressure medicine. Doing this puts you at risk for problems and   can make the medicine less effective.  Ask your health care provider about side effects or reactions to medicines that you should watch for. Contact a health care provider if:  You think you are having a reaction to a medicine you are taking.  You have headaches that keep coming back (recurring).  You feel dizzy.  You have swelling in your ankles.  You have trouble with your vision. Get help right away if:  You develop a severe headache or confusion.  You have unusual weakness or numbness.  You feel faint.  You have severe pain in your chest or abdomen.  You vomit repeatedly.  You have trouble breathing. Summary  Hypertension is when the force of blood pumping through your arteries is too strong. If this condition is not controlled, it may put you at risk for serious  complications.  Your personal target blood pressure may vary depending on your medical conditions, your age, and other factors. For most people, a normal blood pressure is less than 120/80.  Hypertension is treated with lifestyle changes, medicines, or a combination of both. Lifestyle changes include weight loss, eating a healthy, low-sodium diet, exercising more, and limiting alcohol. This information is not intended to replace advice given to you by your health care provider. Make sure you discuss any questions you have with your health care provider. Document Released: 11/01/2005 Document Revised: 09/29/2016 Document Reviewed: 09/29/2016 Elsevier Interactive Patient Education  2018 Elsevier Inc.  

## 2017-11-01 NOTE — Progress Notes (Addendum)
   MRN: [id number] DOB: 06-Feb-1951  Subjective:   Barbara Adkins is a 66 y.o. female presenting for follow up on Hypertension.   Currently managed with amlodipine 10mg . Avoids salt in diet. Denies dizziness, chronic headache, blurred vision, chest pain, shortness of breath, heart racing, palpitations, nausea, vomiting, abdominal pain, hematuria, lower leg swelling. Denies smoking cigarettes or drinking alcohol. Reports that her joint pains have improved, cough has also improved since the last visit she had at the [facility].  Barbara Adkins has a current medication list which includes the following prescription(s): albuterol, amlodipine, azithromycin, benzonatate, fexofenadine hcl, and prednisone. Also has No Known Allergies.  Barbara Adkins  has a past medical history of Hypertension. Also  has no past surgical history on file.  Objective:   Vitals: BP (!) 146/80   Pulse 77   Temp 98.4 F (36.9 C) (Oral)   Resp 16   Ht 5\' 4"  (1.626 m)   Wt 197 lb 12.8 oz (89.7 kg)   SpO2 98%   BMI 33.95 kg/m   BP Readings from Last 3 Encounters:  11/01/17 (!) 146/80  10/27/17 (!) 162/106  03/09/17 (!) 173/98   The 10-year ASCVD risk score Barbara Adkins DC Jr., et al., 2013) is: 15.1%   Values used to calculate the score:     Age: 19 years     Sex: Female     Is Non-Hispanic African American: Yes     Diabetic: No     Tobacco smoker: No     Systolic Blood Pressure: 700 mmHg     Is BP treated: Yes     HDL Cholesterol: 80 mg/dL     Total Cholesterol: 256 mg/dL  Physical Exam  Constitutional: She is oriented to person, place, and time. She appears well-developed and well-nourished.  HENT:  Mouth/Throat: Oropharynx is clear and moist.  Eyes: No scleral icterus.  Cardiovascular: Normal rate, regular rhythm and intact distal pulses. Exam reveals no gallop and no friction rub.  No murmur heard. Pulmonary/Chest: No respiratory distress. She has no wheezes. She has no rales.  Musculoskeletal:  She exhibits no edema.  Neurological: She is alert and oriented to person, place, and time.  Skin: Skin is warm and dry.  Psychiatric: She has a normal mood and affect.   Assessment and Plan :   1. Essential hypertension 2. Elevated blood pressure reading 3. Class 1 obesity without serious comorbidity with body mass index (BMI) of 33.0 to 33.9 in adult, unspecified obesity type - Maintain amlodipine 10mg , will add losartan 50mg  for renal protection and additional help with HTN. Discussed dietary modifications. Check BP once weekly. Labs pending. RTC in 3 months for annual physical, recheck on BP. Return-to-clinic precautions discussed, patient verbalized understanding.   4. Need for prophylactic vaccination and inoculation against influenza - Flu Vaccine QUAD 36+ mos IM   Jaynee Eagles, PA-C [facility] [phone] 11/01/2017  10:01 AM

## 2017-11-02 ENCOUNTER — Other Ambulatory Visit: Payer: Self-pay | Admitting: Urgent Care

## 2017-11-02 LAB — COMPREHENSIVE METABOLIC PANEL
ALBUMIN: 4.2 g/dL (ref 3.6–4.8)
ALK PHOS: 77 IU/L (ref 39–117)
ALT: 9 IU/L (ref 0–32)
AST: 18 IU/L (ref 0–40)
Albumin/Globulin Ratio: 1.1 — ABNORMAL LOW (ref 1.2–2.2)
BILIRUBIN TOTAL: 0.3 mg/dL (ref 0.0–1.2)
BUN / CREAT RATIO: 15 (ref 12–28)
BUN: 13 mg/dL (ref 8–27)
CHLORIDE: 100 mmol/L (ref 96–106)
CO2: 29 mmol/L (ref 20–29)
Calcium: 9.6 mg/dL (ref 8.7–10.3)
Creatinine, Ser: 0.84 mg/dL (ref 0.57–1.00)
GFR calc non Af Amer: 73 mL/min/{1.73_m2} (ref >59)
GFR, EST AFRICAN AMERICAN: 84 mL/min/{1.73_m2} (ref >59)
GLOBULIN, TOTAL: 3.9 g/dL (ref 1.5–4.5)
Glucose: 94 mg/dL (ref 65–99)
Potassium: 3.9 mmol/L (ref 3.5–5.2)
SODIUM: 140 mmol/L (ref 134–144)
TOTAL PROTEIN: 8.1 g/dL (ref 6.0–8.5)

## 2017-11-02 LAB — LIPID PANEL
CHOLESTEROL TOTAL: 256 mg/dL — AB (ref 100–199)
Chol/HDL Ratio: 3.2 ratio (ref 0.0–4.4)
HDL: 80 mg/dL (ref >39)
LDL Calculated: 154 mg/dL — ABNORMAL HIGH (ref 0–99)
Triglycerides: 110 mg/dL (ref 0–149)
VLDL CHOLESTEROL CAL: 22 mg/dL (ref 5–40)

## 2017-11-02 MED ORDER — ATORVASTATIN CALCIUM 20 MG PO TABS: 20.0000 mg | ORAL_TABLET | Freq: Every day | ORAL | 3 refills | Status: DC

## 2017-11-02 MED FILL — ATORVASTATIN 20 MG TABLET: 20 | 90 days supply | Qty: 90 | Fill #0

## 2017-11-17 ENCOUNTER — Telehealth: Payer: Self-pay | Admitting: Urgent Care

## 2017-11-17 NOTE — Telephone Encounter (Signed)
Requesting refill of prednisone; states has helped and would like refill.  LOV 11/01/17 with M.Mani, PA

## 2017-11-17 NOTE — Telephone Encounter (Signed)
Copied from [place] [id number]. Topic: Quick Communication - See Telephone Encounter >> Nov 17, 2017 11:53 AM Boyd Kerbs wrote: CRM for notification. See Telephone encounter for:   Pt. Is requesting prescription refill for predniSONE (DELTASONE) 20 MG tablet. Has worked and asking for more.  Asking to have it so can pick up tomorrow  [place], [place]. [address] [place] [address] Phone: [phone] Fax: [phone]   11/17/17.

## 2017-11-18 NOTE — Telephone Encounter (Signed)
Patient needs to be seen so that we evaluate her symptoms. Steroids can have severe side effects and long term effects with frequent use.

## 2017-11-18 NOTE — Telephone Encounter (Signed)
Do you want to see patient

## 2017-11-18 NOTE — Telephone Encounter (Signed)
Informed patient states she will call back for an appointment

## 2017-11-21 MED FILL — VENTOLIN HFA 90 MCG INHALER: 108 (90 BAS | 25 days supply | Qty: 18 | Fill #4

## 2017-11-21 MED FILL — AMLODIPINE BESYLATE 10 MG T: 10 | 90 days supply | Qty: 90 | Fill #0

## 2017-11-23 NOTE — Telephone Encounter (Signed)
Advised pt to schedule OV for new cold like symptoms.  Copied from [place] [id number]. Topic: General - Other >> Nov 23, 2017 10:03 AM Barbara Adkins wrote: Reason for CRM: Patient would like a call back from Rosario Adie or his assistant. She would like to know what he suggest she use to get rid of her cold. Patient is congested and can't breathe out of her nose very well.

## 2017-11-24 ENCOUNTER — Ambulatory Visit (INDEPENDENT_AMBULATORY_CARE_PROVIDER_SITE_OTHER): Payer: Medicare HMO | Admitting: Urgent Care

## 2017-11-24 ENCOUNTER — Encounter: Payer: Self-pay | Admitting: Urgent Care

## 2017-11-24 VITALS — BP 126/82 | HR 83 | Temp 98.6°F | Resp 18 | Ht 63.0 in | Wt 196.0 lb

## 2017-11-24 DIAGNOSIS — J069 Acute upper respiratory infection, unspecified: Secondary | ICD-10-CM | POA: Diagnosis not present

## 2017-11-24 DIAGNOSIS — R0981 Nasal congestion: Secondary | ICD-10-CM | POA: Diagnosis not present

## 2017-11-24 DIAGNOSIS — R062 Wheezing: Secondary | ICD-10-CM | POA: Diagnosis not present

## 2017-11-24 DIAGNOSIS — R05 Cough: Secondary | ICD-10-CM

## 2017-11-24 DIAGNOSIS — R059 Cough, unspecified: Secondary | ICD-10-CM

## 2017-11-24 MED ORDER — PSEUDOEPHEDRINE HCL 60 MG PO TABS: 60.0000 mg | ORAL_TABLET | Freq: Three times a day (TID) | ORAL | 0 refills | Status: DC | PRN

## 2017-11-24 MED ORDER — CETIRIZINE HCL 10 MG PO TABS: 10.0000 mg | ORAL_TABLET | Freq: Every day | ORAL | 11 refills | Status: AC

## 2017-11-24 MED ORDER — AMOXICILLIN 500 MG PO CAPS: 500.0000 mg | ORAL_CAPSULE | Freq: Three times a day (TID) | ORAL | 0 refills | Status: DC

## 2017-11-24 MED ORDER — HYDROCODONE-HOMATROPINE 5-1.5 MG/5ML PO SYRP: 5.0000 mL | ORAL_SOLUTION | Freq: Every evening | ORAL | 0 refills | Status: DC | PRN

## 2017-11-24 MED FILL — HYDROCODONE-HOMATROPINE SYR: 5-1.5 | 20 days supply | Qty: 100 | Fill #0

## 2017-11-24 NOTE — Progress Notes (Signed)
   MRN: [id number] DOB: 01-20-1951  Subjective:   Barbara Adkins is a 67 y.o. female presenting for 5 day history of sinus congestion, sinus pressure, mild sore throat, mildly productive cough, wheezing. Has tried Mucinex. Denies fever, sinus pain, ear pain, ear drainage, chest pain, shob, n/v, abdominal pain, rashes. Denies smoking cigarettes. Denies history of allergies, asthma.   Barbara Adkins has a current medication list which includes the following prescription(s): albuterol, amlodipine, atorvastatin, losartan, and fexofenadine hcl. Also has No Known Allergies.  Barbara Adkins  has a past medical history of Hypertension. Denies past surgical history.  Objective:   Vitals: BP 126/82   Pulse 83   Temp 98.6 F (37 C) (Oral)   Resp 18   Ht 5\' 3"  (1.6 m)   Wt 196 lb (88.9 kg)   SpO2 95%   BMI 34.72 kg/m   BP Readings from Last 3 Encounters:  11/24/17 126/82  11/01/17 (!) 146/80  10/27/17 (!) 162/106    Wt Readings from Last 3 Encounters:  11/24/17 196 lb (88.9 kg)  11/01/17 197 lb 12.8 oz (89.7 kg)  03/02/14 206 lb 7 oz (93.6 kg)    Physical Exam  Constitutional: She is oriented to person, place, and time. She appears well-developed and well-nourished.  HENT:  TM's intact bilaterally, no effusions or erythema. Nasal turbinates erythematous, nasal passages minimally patent with thick mucus. No sinus tenderness. Oropharynx clear, mucous membranes moist.    Eyes: Right eye exhibits no discharge. Left eye exhibits no discharge.  Neck: Normal range of motion. Neck supple.  Cardiovascular: Normal rate, regular rhythm and intact distal pulses. Exam reveals no gallop and no friction rub.  No murmur heard. Pulmonary/Chest: No respiratory distress. She has wheezes (bibasilar fields). She has no rales.  Lymphadenopathy:    She has no cervical adenopathy.  Neurological: She is alert and oriented to person, place, and time.  Skin: Skin is warm and dry.  Psychiatric: She has a normal  mood and affect.   Assessment and Plan :   Viral upper respiratory infection  Sinus congestion  Cough  Wheezing  Will start supportive care for viral illness. Return-to-clinic precautions discussed, patient verbalized understanding.   Jaynee Eagles, PA-C [facility] [phone] 11/24/2017  10:00 AM

## 2017-11-24 NOTE — Patient Instructions (Addendum)
For sore throat try using a honey-based tea. Use 3 teaspoons of honey with juice squeezed from half lemon. Place shaved pieces of ginger into 1/2-1 cup of water and warm over stove top. Then mix the ingredients and repeat every 4 hours as needed.     Upper Respiratory Infection, Adult Most upper respiratory infections (URIs) are a viral infection of the air passages leading to the lungs. A URI affects the nose, throat, and upper air passages. The most common type of URI is nasopharyngitis and is typically referred to as "the common cold." URIs run their course and usually go away on their own. Most of the time, a URI does not require medical attention, but sometimes a bacterial infection in the upper airways can follow a viral infection. This is called a secondary infection. Sinus and middle ear infections are common types of secondary upper respiratory infections. Bacterial pneumonia can also complicate a URI. A URI can worsen asthma and chronic obstructive pulmonary disease (COPD). Sometimes, these complications can require emergency medical care and may be life threatening. What are the causes? Almost all URIs are caused by viruses. A virus is a type of germ and can spread from one person to another. What increases the risk? You may be at risk for a URI if:  You smoke.  You have chronic heart or lung disease.  You have a weakened defense (immune) system.  You are very young or very old.  You have nasal allergies or asthma.  You work in crowded or poorly ventilated areas.  You work in health care facilities or schools.  What are the signs or symptoms? Symptoms typically develop 2-3 days after you come in contact with a cold virus. Most viral URIs last 7-10 days. However, viral URIs from the influenza virus (flu virus) can last 14-18 days and are typically more severe. Symptoms may include:  Runny or stuffy (congested) nose.  Sneezing.  Cough.  Sore  throat.  Headache.  Fatigue.  Fever.  Loss of appetite.  Pain in your forehead, behind your eyes, and over your cheekbones (sinus pain).  Muscle aches.  How is this diagnosed? Your health care provider may diagnose a URI by:  Physical exam.  Tests to check that your symptoms are not due to another condition such as: ? Strep throat. ? Sinusitis. ? Pneumonia. ? Asthma.  How is this treated? A URI goes away on its own with time. It cannot be cured with medicines, but medicines may be prescribed or recommended to relieve symptoms. Medicines may help:  Reduce your fever.  Reduce your cough.  Relieve nasal congestion.  Follow these instructions at home:  Take medicines only as directed by your health care provider.  Gargle warm saltwater or take cough drops to comfort your throat as directed by your health care provider.  Use a warm mist humidifier or inhale steam from a shower to increase air moisture. This may make it easier to breathe.  Drink enough fluid to keep your urine clear or pale yellow.  Eat soups and other clear broths and maintain good nutrition.  Rest as needed.  Return to work when your temperature has returned to normal or as your health care provider advises. You may need to stay home longer to avoid infecting others. You can also use a face mask and careful hand washing to prevent spread of the virus.  Increase the usage of your inhaler if you have asthma.  Do not use any tobacco products, including  cigarettes, chewing tobacco, or electronic cigarettes. If you need help quitting, ask your health care provider. How is this prevented? The best way to protect yourself from getting a cold is to practice good hygiene.  Avoid oral or hand contact with people with cold symptoms.  Wash your hands often if contact occurs.  There is no clear evidence that vitamin C, vitamin E, echinacea, or exercise reduces the chance of developing a cold. However, it is  always recommended to get plenty of rest, exercise, and practice good nutrition. Contact a health care provider if:  You are getting worse rather than better.  Your symptoms are not controlled by medicine.  You have chills.  You have worsening shortness of breath.  You have brown or red mucus.  You have yellow or brown nasal discharge.  You have pain in your face, especially when you bend forward.  You have a fever.  You have swollen neck glands.  You have pain while swallowing.  You have white areas in the back of your throat. Get help right away if:  You have severe or persistent: ? Headache. ? Ear pain. ? Sinus pain. ? Chest pain.  You have chronic lung disease and any of the following: ? Wheezing. ? Prolonged cough. ? Coughing up blood. ? A change in your usual mucus.  You have a stiff neck.  You have changes in your: ? Vision. ? Hearing. ? Thinking. ? Mood. This information is not intended to replace advice given to you by your health care provider. Make sure you discuss any questions you have with your health care provider. Document Released: 04/27/2001 Document Revised: 07/04/2016 Document Reviewed: 02/06/2014 Elsevier Interactive Patient Education  2018 [employer].     IF you received an x-ray today, you will receive an invoice from [facility]. Please contact [facility] at [phone] with questions or concerns regarding your invoice.   IF you received labwork today, you will receive an invoice from [facility]. Please contact [facility] at [phone] with questions or concerns regarding your invoice.   Our billing staff will not be able to assist you with questions regarding bills from these companies.  You will be contacted with the lab results as soon as they are available. The fastest way to get your results is to activate your My Chart account. Instructions are located on the last page of this paperwork. If you have not heard  from [place] regarding the results in 2 weeks, please contact this office.

## 2017-11-30 MED FILL — AMOXICILLIN 500 MG CAPSULE: 500 | 7 days supply | Qty: 21 | Fill #0

## 2017-12-01 ENCOUNTER — Other Ambulatory Visit: Payer: Self-pay | Admitting: Urgent Care

## 2018-01-23 DIAGNOSIS — H2513 Age-related nuclear cataract, bilateral: Secondary | ICD-10-CM | POA: Diagnosis not present

## 2018-01-23 DIAGNOSIS — H5203 Hypermetropia, bilateral: Secondary | ICD-10-CM | POA: Diagnosis not present

## 2018-01-23 DIAGNOSIS — H04123 Dry eye syndrome of bilateral lacrimal glands: Secondary | ICD-10-CM | POA: Diagnosis not present

## 2018-01-23 DIAGNOSIS — H524 Presbyopia: Secondary | ICD-10-CM | POA: Diagnosis not present

## 2018-01-23 DIAGNOSIS — H52203 Unspecified astigmatism, bilateral: Secondary | ICD-10-CM | POA: Diagnosis not present

## 2018-01-23 MED FILL — ATORVASTATIN 20 MG TABLET: 20 | 90 days supply | Qty: 90 | Fill #1

## 2018-01-23 MED FILL — VENTOLIN HFA 90 MCG INHALER: 108 (90 BAS | 25 days supply | Qty: 18 | Fill #5

## 2018-01-23 MED FILL — LOSARTAN POTASSIUM 50 MG TA: 50 | 90 days supply | Qty: 90 | Fill #1

## 2018-01-23 MED FILL — AMLODIPINE BESYLATE 10 MG T: 10 | 90 days supply | Qty: 90 | Fill #1

## 2018-02-02 ENCOUNTER — Encounter: Payer: Medicare HMO | Admitting: Urgent Care

## 2018-02-08 ENCOUNTER — Ambulatory Visit (INDEPENDENT_AMBULATORY_CARE_PROVIDER_SITE_OTHER): Payer: Medicare Other

## 2018-02-08 ENCOUNTER — Encounter: Payer: Self-pay | Admitting: Family Medicine

## 2018-02-08 ENCOUNTER — Other Ambulatory Visit: Payer: Self-pay

## 2018-02-08 ENCOUNTER — Ambulatory Visit (INDEPENDENT_AMBULATORY_CARE_PROVIDER_SITE_OTHER): Payer: Medicare Other | Admitting: Family Medicine

## 2018-02-08 VITALS — BP 125/86 | HR 58 | Temp 99.4°F | Resp 16 | Ht 63.0 in | Wt 194.2 lb

## 2018-02-08 DIAGNOSIS — M25552 Pain in left hip: Secondary | ICD-10-CM

## 2018-02-08 DIAGNOSIS — R062 Wheezing: Secondary | ICD-10-CM

## 2018-02-08 DIAGNOSIS — M25561 Pain in right knee: Secondary | ICD-10-CM | POA: Diagnosis not present

## 2018-02-08 DIAGNOSIS — M25551 Pain in right hip: Secondary | ICD-10-CM

## 2018-02-08 DIAGNOSIS — J310 Chronic rhinitis: Secondary | ICD-10-CM

## 2018-02-08 DIAGNOSIS — G8929 Other chronic pain: Secondary | ICD-10-CM

## 2018-02-08 DIAGNOSIS — J339 Nasal polyp, unspecified: Secondary | ICD-10-CM | POA: Diagnosis not present

## 2018-02-08 DIAGNOSIS — M160 Bilateral primary osteoarthritis of hip: Secondary | ICD-10-CM | POA: Diagnosis not present

## 2018-02-08 MED ORDER — PREDNISONE 10 MG (48) PO TBPK: ORAL_TABLET | ORAL | 0 refills | Status: AC

## 2018-02-08 MED FILL — predniSONE 10 MG TABS: 10 | 12 days supply | Qty: 42 | Fill #0

## 2018-02-08 NOTE — Patient Instructions (Addendum)
If you have thyroid disease, diabetes, hypertension, tachycardia or seizure disorder If you take heart meds or ADD meds   You should not take Dextromorphan or Phenylephrine These are common found in decongestant and cold medications This can cause very high pulses and high blood pressure.  Ask you pharmacist to make sure this ingredient is not present   You can take zyrtec during the high pollen and mucinex for the cough Take the prednisone as instructed You will be called by referrals regarding the appointment for Ear, Nose and Throat     IF you received an x-ray today, you will receive an invoice from [facility]. Please contact [facility] at [phone] with questions or concerns regarding your invoice.   IF you received labwork today, you will receive an invoice from [facility]. Please contact [facility] at [phone] with questions or concerns regarding your invoice.   Our billing staff will not be able to assist you with questions regarding bills from these companies.  You will be contacted with the lab results as soon as they are available. The fastest way to get your results is to activate your My Chart account. Instructions are located on the last page of this paperwork. If you have not heard from [place] regarding the results in 2 weeks, please contact this office.

## 2018-02-08 NOTE — Progress Notes (Signed)
Chief Complaint  Patient presents with  . new pt establish care    cold symptoms x 1 week, cough, nasal congestion, taking sudafed, albuterol, white thick drainage.  C/o right lower leg pain and left hip pain  feels like hip is out of socket    HPI  Nasal congestion Pt reports that seh was seen for viral UR 11/24/17 and was prescribed amoxicillin and hycodan cough medicaiton as well as zyrtec She improved but now she is having congestion, cough productive with thick white sputum as well as some postnasal drip.  She has some sinus pressure and some wheezing She uses her albuterol inhaler sometimes.  Allergic rhinitis She reports that she has a allergic rhinitis and feels like when the pollen started is when she got sick again She previously was taking zyrtec and mucinex and has not taken any this episode  She does not have pets She does not smoke or have asthma  Knee Pain and Hip Pain Pt reports pain in the hips that are chronic but lately noted that she is having right knee pain She can hardly straighten up from a bended position Pain that is 6-7/10 No history of trauma Worse in the past few months Feels like she has to hold on to furniture  Past Medical History:  Diagnosis Date  . Hypertension     Current Outpatient Medications  Medication Sig Dispense Refill  . albuterol (PROVENTIL HFA;VENTOLIN HFA) 108 (90 Base) MCG/ACT inhaler Inhale 2 puffs into the lungs every 4 (four) hours as needed for wheezing or shortness of breath (cough, shortness of breath or wheezing.). 1 Inhaler 11  . amLODipine (NORVASC) 10 MG tablet Take 1 tablet (10 mg total) by mouth daily. 90 tablet 1  . amoxicillin (AMOXIL) 500 MG capsule Take 1 capsule (500 mg total) by mouth 3 (three) times daily. 21 capsule 0  . atorvastatin (LIPITOR) 20 MG tablet Take 1 tablet (20 mg total) by mouth daily. 90 tablet 3  . cetirizine (ZYRTEC) 10 MG tablet Take 1 tablet (10 mg total) by mouth daily. 30 tablet 11  .  Fexofenadine HCl (MUCINEX ALLERGY PO) Take by mouth.    Marland Kitchen HYDROcodone-homatropine (HYCODAN) 5-1.5 MG/5ML syrup Take 5 mLs by mouth at bedtime as needed. 100 mL 0  . losartan (COZAAR) 50 MG tablet Take 1 tablet (50 mg total) by mouth daily. 90 tablet 1  . pseudoephedrine (SUDAFED) 60 MG tablet Take 1 tablet (60 mg total) by mouth every 8 (eight) hours as needed. 30 tablet 0  . predniSONE (STERAPRED UNI-PAK 48 TAB) 10 MG (48) TBPK tablet Take 6 tablets (60 mg total) by mouth daily for 2 days, THEN 5 tablets (50 mg total) daily for 2 days, THEN 4 tablets (40 mg total) daily for 2 days, THEN 3 tablets (30 mg total) daily for 2 days, THEN 2 tablets (20 mg total) daily for 2 days, THEN 1 tablet (10 mg total) daily for 2 days. 42 tablet 0   No current facility-administered medications for this visit.     Allergies: No Known Allergies  No past surgical history on file.  Social History   Socioeconomic History  . Marital status: Divorced    Spouse name: Not on file  . Number of children: Not on file  . Years of education: Not on file  . Highest education level: Not on file  Occupational History  . Not on file  Social Needs  . Financial resource strain: Not on file  . Food  insecurity:    Worry: Not on file    Inability: Not on file  . Transportation needs:    Medical: Not on file    Non-medical: Not on file  Tobacco Use  . Smoking status: Never Smoker  . Smokeless tobacco: Never Used  Substance and Sexual Activity  . Alcohol use: No  . Drug use: Not on file  . Sexual activity: Not on file  Lifestyle  . Physical activity:    Days per week: Not on file    Minutes per session: Not on file  . Stress: Not on file  Relationships  . Social connections:    Talks on phone: Not on file    Gets together: Not on file    Attends religious service: Not on file    Active member of club or organization: Not on file    Attends meetings of clubs or organizations: Not on file    Relationship  status: Not on file  Other Topics Concern  . Not on file  Social History Narrative  . Not on file    No family history on file.   ROS Review of Systems See HPI Constitution: No fevers or chills No malaise No diaphoresis Skin: No rash or itching Eyes: no blurry vision, no double vision GU: no dysuria or hematuria Neuro: no dizziness or headaches all others reviewed and negative   Objective: Vitals:   02/08/18 1055  BP: 125/86  Pulse: (!) 58  Resp: 16  Temp: 99.4 F (37.4 C)  TempSrc: Oral  SpO2: 97%  Weight: 194 lb 3.2 oz (88.1 kg)  Height: 5\' 3"  (1.6 m)    Physical Exam General: alert, oriented, in NAD Head: normocephalic, atraumatic, no sinus tenderness Eyes: EOM intact, no scleral icterus or conjunctival injection Ears: TM clear bilaterally Nose: mucosa erythematous, edematous on the left Right nare with a large edematous mass that has filled the ENTIRE space and is distending the nostril There is audible wheezing when she breathes through her nose Throat: no pharyngeal exudate or erythema Lymph: no posterior auricular, submental or cervical lymph adenopathy Heart: normal rate, normal sinus rhythm, no murmurs Lungs: bilateral wheezing on expiration  Musculoskeletal Pt with difficulty straigtening after being seated Nontender of the greater tuberosity Decreased range of motion with hip exercises nontender of the SI bilaterally Decreased range of motion with flexion at the waist Right knee tender to palpation  Bilateral knee crepitus  RIGHT KNEE - 1-2 VIEW  COMPARISON:  None.  FINDINGS: No evidence of fracture, dislocation, or joint effusion. No evidence of arthropathy or other focal bone abnormality. Soft tissues are unremarkable.  IMPRESSION: Normal right knee.   Electronically Signed   By: Marijo Conception, M.D.   On: 02/08/2018 12:05  DG HIP (WITH OR WITHOUT PELVIS) 2V BILAT  COMPARISON:  None.  FINDINGS: No pelvic fracture or  diastasis. No hip fracture or dislocation. No suspicious focal osseous lesions. Moderate left and mild right hip osteoarthritis, with subchondral cystic and sclerotic changes in the left hip and small marginal osteophytes in both hip joints, larger on the left. Degenerative changes are present in the visualized lower lumbar spine. No radiopaque foreign bodies.  IMPRESSION: Moderate left and mild right hip osteoarthritis. Lower lumbar degenerative changes.   Electronically Signed   By: Ilona Sorrel M.D.   On: 02/08/2018 12:05     Assessment and Plan Ranay was seen today for new pt establish care.  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     [place]

## 2018-02-20 ENCOUNTER — Other Ambulatory Visit: Payer: Self-pay | Admitting: Family Medicine

## 2018-02-21 ENCOUNTER — Encounter (INDEPENDENT_AMBULATORY_CARE_PROVIDER_SITE_OTHER): Payer: Self-pay | Admitting: Orthopaedic Surgery

## 2018-02-21 ENCOUNTER — Ambulatory Visit (INDEPENDENT_AMBULATORY_CARE_PROVIDER_SITE_OTHER): Payer: Medicare Other | Admitting: Orthopaedic Surgery

## 2018-02-21 VITALS — BP 119/80 | HR 73 | Resp 16 | Ht 63.5 in | Wt 190.0 lb

## 2018-02-21 DIAGNOSIS — M25552 Pain in left hip: Secondary | ICD-10-CM

## 2018-02-21 DIAGNOSIS — M25551 Pain in right hip: Secondary | ICD-10-CM

## 2018-02-21 NOTE — Progress Notes (Signed)
Office Visit Note   Patient: Barbara Adkins           Date of Birth: [birth year]           MRN: [id number] Visit Date: 02/21/2018              Requested by: Jaynee Eagles, PA-C [place], [place] [address] PCP: Forrest Moron, MD   Assessment & Plan: Visit Diagnoses:  1. 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  X-ray demonstrates more advanced arthritis of her left compared to the right hip.  Recently was on a course of prednisone per her primary care physician with some relief.  Long discussion regarding diagnosis and x-ray findings.  Also discussed various treatment options NSAIDs, weight loss, exercises, cortisone injection and hip replacement.  We will try exercises, over-the-counter NSAIDs and return as needed.   Follow-Up Instructions: Return if symptoms worsen or fail to improve.   Orders:  No orders of the defined types were placed in this encounter.  No orders of the defined types were placed in this encounter.     Procedures: No procedures performed   Clinical Data: No additional findings.   Subjective: Chief Complaint  Patient presents with  . Left Hip - Pain  . Right Knee - Pain  . New Patient (Initial Visit)    L HIP PAIN FOR HALF A YEAR, NO INJURY OR INJECTIONS, R KNEE PAIN FOR 3 WEEKS NO INJURY OR INJETIONS  Mrs. Barbara Adkins relates insidious onset of left greater than right hip pain over 6 months ago.  She recently was evaluated by her primary care physician who placed her on a course of prednisone.  She felt "much better".  She still having a little discomfort but not to the point where she is compromise.  She has had bilateral groin pain, lateral hip pain and some discomfort into both thighs and hips.  Symptoms are considerably more on the left than the right.  No significant back pain.  No numbness or tingling.  No history of injury or trauma.  Denies any fever chills shortness of breath or  chest pain  HPI  Review of Systems  Constitutional: Negative for fatigue.  HENT: Negative for ear pain.   Eyes: Negative for pain.  Respiratory: Negative for cough and shortness of breath.   Cardiovascular: Positive for leg swelling.  Gastrointestinal: Negative for constipation and diarrhea.  Genitourinary: Negative for difficulty urinating.  Musculoskeletal: Negative for back pain and neck pain.  Skin: Negative for rash.  Allergic/Immunologic: Negative for food allergies.  Neurological: Negative for weakness, light-headedness, numbness and headaches.  Hematological: Does not bruise/bleed easily.  Psychiatric/Behavioral: Negative for sleep disturbance.     Objective: Vital Signs: BP 119/80 (BP Location: Right Arm, Patient Position: Sitting, Cuff Size: Normal)   Pulse 73   Resp 16   Ht 5' 3.5" (1.613 m)   Wt 190 lb (86.2 kg)   BMI 33.13 kg/m   Physical Exam  Ortho Exam Awake alert and oriented x3.  Comfortable sitting straight leg raise negative bilaterally.  Neurovascular exam intact.  No lower extremity edema.  No specific localized knee pain.  Has some mild limitation of external/internal rotation of her right hip on  extremes but significantly decreased range of motion of her left hip with the same motions.   Specialty Comments:  No specialty comments available.  Imaging: No results found.   PMFS History: There  are no active problems to display for this patient.  Past Medical History:  Diagnosis Date  . Hypertension     History reviewed. No pertinent family history.  History reviewed. No pertinent surgical history. Social History   Occupational History  . Not on file  Tobacco Use  . Smoking status: Never Smoker  . Smokeless tobacco: Never Used  Substance and Sexual Activity  . Alcohol use: No  . Drug use: Never  . Sexual activity: Not on file

## 2018-03-02 ENCOUNTER — Telehealth: Payer: Self-pay | Admitting: Family Medicine

## 2018-03-02 NOTE — Telephone Encounter (Signed)
Copied from [place] [phone]. Topic: Quick Communication - See Telephone Encounter >> Mar 02, 2018  2:19 PM Aurelio Brash B wrote: CRM for notification. See Telephone encounter for: 03/02/18. PT called to see if Dr Nolon Rod could call in  a pain medication for her,  she needs hip replacement and she is in so much pain in her right  leg , pt has apt with [facility] in May   [facility] - [place], [place] - [address] [phone] (Phone) [phone] (Fax)

## 2018-03-03 NOTE — Telephone Encounter (Signed)
Please advise 

## 2018-03-06 ENCOUNTER — Telehealth: Payer: Self-pay

## 2018-03-06 NOTE — Telephone Encounter (Signed)
Copied from [place] [id number]. Topic: Quick Communication - See Telephone Encounter >> Mar 02, 2018  2:19 PM Aurelio Brash B wrote: CRM for notification. See Telephone encounter for: 03/02/18. PT called to see if Dr Nolon Rod could call in  a pain medication for her,  she ends hip replacement and she is in so much pain in her right hip and leg , pt has apt with [facility] in May   [facility] - [place], [place] - [address] [facility]. [phone] (Phone) [phone] (Fax)     >> Mar 06, 2018  9:37 AM Percell Belt A wrote: Pt called back this morning to check on the status of this?    Best number [phone]

## 2018-03-10 ENCOUNTER — Telehealth (INDEPENDENT_AMBULATORY_CARE_PROVIDER_SITE_OTHER): Payer: Self-pay | Admitting: Orthopaedic Surgery

## 2018-03-10 NOTE — Telephone Encounter (Signed)
Left detailed VM advising pt.  

## 2018-03-10 NOTE — Telephone Encounter (Signed)
Spoke with pt to follow up with PCP to get more pain meds and to have knee evaluated. Pt stated she has an appointment coming up with her PCP and will discuss the it then but will call to see if they can send her some pain meds in until she can be seen.

## 2018-03-10 NOTE — Telephone Encounter (Signed)
Have not formally evaluated right knee pain or foot problem.Needs to be seen before any meds prescribed or check with family physician re a pain med

## 2018-03-10 NOTE — Telephone Encounter (Signed)
Patient having a lot of pain with her rt knee, and rt foot swelling up to behind knee. Patient is calling for an rx for pain if possible. [facility] uses [facility]. Please call to advise.

## 2018-03-10 NOTE — Telephone Encounter (Signed)
She should contact her Orthopedic Surgeon. I do not manage pain medications.

## 2018-03-10 NOTE — Telephone Encounter (Signed)
PLEASE ADVISE.

## 2018-03-20 ENCOUNTER — Ambulatory Visit (INDEPENDENT_AMBULATORY_CARE_PROVIDER_SITE_OTHER): Payer: Medicare Other | Admitting: Family Medicine

## 2018-03-20 ENCOUNTER — Other Ambulatory Visit: Payer: Self-pay

## 2018-03-20 ENCOUNTER — Encounter: Payer: Self-pay | Admitting: Family Medicine

## 2018-03-20 VITALS — BP 126/86 | HR 82 | Temp 99.4°F | Resp 16 | Ht 63.5 in | Wt 200.2 lb

## 2018-03-20 DIAGNOSIS — M25562 Pain in left knee: Secondary | ICD-10-CM | POA: Insufficient documentation

## 2018-03-20 DIAGNOSIS — E6609 Other obesity due to excess calories: Secondary | ICD-10-CM

## 2018-03-20 DIAGNOSIS — M25561 Pain in right knee: Secondary | ICD-10-CM | POA: Diagnosis not present

## 2018-03-20 DIAGNOSIS — I10 Essential (primary) hypertension: Secondary | ICD-10-CM

## 2018-03-20 DIAGNOSIS — Z6834 Body mass index (BMI) 34.0-34.9, adult: Secondary | ICD-10-CM | POA: Insufficient documentation

## 2018-03-20 MED ORDER — LIDOCAINE HCL 2 % IJ SOLN
1.0000 mL | Freq: Once | INTRAMUSCULAR | Status: AC
  Administered 2018-03-20: 20 mg

## 2018-03-20 MED ORDER — TRIAMCINOLONE ACETONIDE 40 MG/ML IJ SUSP
60.0000 mg | Freq: Once | INTRAMUSCULAR | Status: AC
  Administered 2018-03-20: 60 mg via INTRAMUSCULAR

## 2018-03-20 MED ORDER — MELOXICAM 7.5 MG PO TABS: 7.5000 mg | ORAL_TABLET | Freq: Every day | ORAL | 0 refills | Status: DC

## 2018-03-20 MED FILL — MELOXICAM 7.5 MG TABLET: 7.5 | 30 days supply | Qty: 30 | Fill #0

## 2018-03-20 NOTE — Progress Notes (Signed)
Procedure note Right knee injection The xray was reviewed  Procedure explained. Informed consent given Time out performed Area prepped and draped Using kenalog 60mg  and 52ml 2% lidocaine the skin was anethesized and the medication injected at the medial joint line Patient tolerated the procedure well. EBL 0cc After care handout given

## 2018-03-20 NOTE — Progress Notes (Signed)
Chief Complaint  Patient presents with  . chronic hip pain    6 week f/u.  Wants something for the left hip and right knee pain since Easter.  Pain is so intense that its hard to stand and swelling is bad.      HPI   Acute Right knee pain She reports that she was having pain in the left knee that throbbs like a tooth ache for 2.5 weeks now She states that right now she has pain that is feeling deep and intense and feels like it is hurting her heart She states that she also has spasms and has to hold her breath She reports that the prednisone cleared up the inflammation in her hip and knee  She states that once she finished the prednisone the pain seemed to come back after about a week of being off the prednisone.  She sees Dr. Durward Fortes at [facility] She was advised to take ibuprofen but only bought the 220mg  tabs and did not experience relief.    Obesity Pt reports that she gained weight in the past 2 weeks due to inactivity She reports that she pain stops her from moving as much as she used to move She has a hard time getting in and out of her car She states that the knee pain required steroids which made her bloated Body mass index is 34.91 kg/m. Wt Readings from Last 3 Encounters:  03/20/18 200 lb 3.2 oz (90.8 kg)  02/21/18 190 lb (86.2 kg)  02/08/18 194 lb 3.2 oz (88.1 kg)    Essential Hypertension Pt reports that she is working on weight loss She is compliant with her bp medications She denies chest pains, palpitations, shortness of breath She denies side effects of her bp meds She gave up red meat She eats 2 meals a day  BP Readings from Last 3 Encounters:  03/20/18 126/86  02/21/18 119/80  02/08/18 125/86     Past Medical History:  Diagnosis Date  . Hypertension     Current Outpatient Medications  Medication Sig Dispense Refill  . albuterol (PROVENTIL HFA;VENTOLIN HFA) 108 (90 Base) MCG/ACT inhaler Inhale 2 puffs into the lungs every 4 (four)  hours as needed for wheezing or shortness of breath (cough, shortness of breath or wheezing.). 1 Inhaler 11  . amLODipine (NORVASC) 10 MG tablet Take 1 tablet (10 mg total) by mouth daily. 90 tablet 1  . atorvastatin (LIPITOR) 20 MG tablet Take 1 tablet (20 mg total) by mouth daily. 90 tablet 3  . cetirizine (ZYRTEC) 10 MG tablet Take 1 tablet (10 mg total) by mouth daily. 30 tablet 11  . Fexofenadine HCl (MUCINEX ALLERGY PO) Take by mouth.    . losartan (COZAAR) 50 MG tablet Take 1 tablet (50 mg total) by mouth daily. 90 tablet 1  . meloxicam (MOBIC) 7.5 MG tablet Take 1 tablet (7.5 mg total) by mouth daily. Take with pepcid and food. 30 tablet 0   Current Facility-Administered Medications  Medication Dose Route Frequency Provider Last Rate Last Dose  . lidocaine (XYLOCAINE) 2 % (with pres) injection 20 mg  1 mL Other Once Stallings, Zoe A, MD      . triamcinolone acetonide (KENALOG-40) injection 60 mg  60 mg Intramuscular Once Delia Chimes A, MD        Allergies: No Known Allergies  No past surgical history on file.  Social History   Socioeconomic History  . Marital status: Divorced    Spouse name: Not on  file  . Number of children: Not on file  . Years of education: Not on file  . Highest education level: Not on file  Occupational History  . Not on file  Social Needs  . Financial resource strain: Not on file  . Food insecurity:    Worry: Not on file    Inability: Not on file  . Transportation needs:    Medical: Not on file    Non-medical: Not on file  Tobacco Use  . Smoking status: Never Smoker  . Smokeless tobacco: Never Used  Substance and Sexual Activity  . Alcohol use: No  . Drug use: Never  . Sexual activity: Not on file  Lifestyle  . Physical activity:    Days per week: Not on file    Minutes per session: Not on file  . Stress: Not on file  Relationships  . Social connections:    Talks on phone: Not on file    Gets together: Not on file    Attends  religious service: Not on file    Active member of club or organization: Not on file    Attends meetings of clubs or organizations: Not on file    Relationship status: Not on file  Other Topics Concern  . Not on file  Social History Narrative  . Not on file    No family history on file.   ROS Review of Systems See HPI Constitution: No fevers or chills No malaise No diaphoresis Skin: No rash or itching Eyes: no blurry vision, no double vision GU: no dysuria or hematuria Neuro: no dizziness or headaches  all others reviewed and negative   Objective: Vitals:   03/20/18 1012  BP: 126/86  Pulse: 82  Resp: 16  Temp: 99.4 F (37.4 C)  TempSrc: Oral  SpO2: 95%  Weight: 200 lb 3.2 oz (90.8 kg)  Height: 5' 3.5" (1.613 m)    Physical Exam  Constitutional: She is oriented to person, place, and time. She appears well-developed and well-nourished.  HENT:  Head: Normocephalic and atraumatic.  Eyes: Conjunctivae and EOM are normal.  Pulmonary/Chest: Effort normal.  Musculoskeletal:  Right knee exam: Tenderness of the medial joint line with pocket of effusion at the medial aspect, crepitus, tenderness over patellar  Neurological: She is alert and oriented to person, place, and time.  Skin: Skin is warm. Capillary refill takes less than 2 seconds.  Psychiatric: She has a normal mood and affect. Her behavior is normal. Judgment and thought content normal.     RIGHT KNEE - 1-2 VIEW  COMPARISON:  None.  FINDINGS: No evidence of fracture, dislocation, or joint effusion. No evidence of arthropathy or other focal bone abnormality. Soft tissues are unremarkable.  IMPRESSION: Normal right knee.   Electronically Signed   By: Marijo Conception, M.D.   On: 02/08/2018 12:05    Assessment and Plan Barbara Adkins was seen today for chronic hip pain.  Diagnoses and all orders for this visit:  Acute pain of right knee-  Performed intraarticular joint injection Gave handicap  placcard form nsaids with pepcid and food -     meloxicam (MOBIC) 7.5 MG tablet; Take 1 tablet (7.5 mg total) by mouth daily. Take with pepcid and food. -     triamcinolone acetonide (KENALOG-40) injection 60 mg -     lidocaine (XYLOCAINE) 2 % (with pres) injection 20 mg  Class 1 obesity due to excess calories with serious comorbidity and body mass index (BMI) of 34.0 to 34.9  in adult-  Discussed healthy snacks, exercising in the pool and limiting portions  Essential hypertension- continue bp meds      Zoe A Nolon Rod

## 2018-03-20 NOTE — Patient Instructions (Addendum)
Your weight increased in the past month Exercise in the pool which will help the knee Eat more vegetables than fruit Add some almonds and cashews as snacks Avoid sugary beverages     IF you received an x-ray today, you will receive an invoice from [facility]. Please contact [facility] at [phone] with questions or concerns regarding your invoice.   IF you received labwork today, you will receive an invoice from [facility]. Please contact [facility] at [phone] with questions or concerns regarding your invoice.   Our billing staff will not be able to assist you with questions regarding bills from these companies.  You will be contacted with the lab results as soon as they are available. The fastest way to get your results is to activate your My Chart account. Instructions are located on the last page of this paperwork. If you have not heard from [place] regarding the results in 2 weeks, please contact this office.    Knee Injection, Care After Refer to this sheet in the next few weeks. These instructions provide you with information about caring for yourself after your procedure. Your health care provider may also give you more specific instructions. Your treatment has been planned according to current medical practices, but problems sometimes occur. Call your health care provider if you have any problems or questions after your procedure. What can I expect after the procedure? After the procedure, it is common to have:  Soreness.  Warmth.  Swelling.  You may have more pain, swelling, and warmth than you did before the injection. This reaction may last for about one day. Follow these instructions at home: Bathing  If you were given a bandage (dressing), keep it dry until your health care provider says it can be removed. Ask your health care provider when you can start showering or taking a bath. Managing pain, stiffness, and swelling  If directed, apply ice to  the injection area: ? Put ice in a plastic bag. ? Place a towel between your skin and the bag. ? Leave the ice on for 20 minutes, 2-3 times per day.  Do not apply heat to your knee.  Raise the injection area above the level of your heart while you are sitting or lying down. Activity  Avoid strenuous activities for as long as directed by your health care provider. Ask your health care provider when you can return to your normal activities. General instructions  Take medicines only as directed by your health care provider.  Do not take aspirin or other over-the-counter medicines unless your health care provider says you can.  Check your injection site every day for signs of infection. Watch for: ? Redness, swelling, or pain. ? Fluid, blood, or pus.  Follow your health care provider's instructions about dressing changes and removal. Contact a health care provider if:  You have symptoms at your injection site that last longer than two days after your procedure.  You have redness, swelling, or pain in your injection area.  You have fluid, blood, or pus coming from your injection site.  You have warmth in your injection area.  You have a fever.  Your pain is not controlled with medicine. Get help right away if:  Your knee turns very red.  Your knee becomes very swollen.  Your knee pain is severe. This information is not intended to replace advice given to you by your health care provider. Make sure you discuss any questions you have with your health care provider. Document  Released: 11/22/2014 Document Revised: 07/07/2016 Document Reviewed: 09/11/2014 Elsevier Interactive Patient Education  [employer].

## 2018-03-22 DIAGNOSIS — J310 Chronic rhinitis: Secondary | ICD-10-CM | POA: Diagnosis not present

## 2018-03-22 DIAGNOSIS — J330 Polyp of nasal cavity: Secondary | ICD-10-CM | POA: Diagnosis not present

## 2018-03-22 DIAGNOSIS — J342 Deviated nasal septum: Secondary | ICD-10-CM | POA: Diagnosis not present

## 2018-03-22 MED FILL — predniSONE 10 MG TABS: 10 | 12 days supply | Qty: 48 | Fill #0

## 2018-03-22 MED FILL — FLUTICASONE PROP 50 MCG SPR: 50 | 30 days supply | Qty: 16 | Fill #0

## 2018-03-24 ENCOUNTER — Other Ambulatory Visit (INDEPENDENT_AMBULATORY_CARE_PROVIDER_SITE_OTHER): Payer: Self-pay | Admitting: Otolaryngology

## 2018-03-24 DIAGNOSIS — J329 Chronic sinusitis, unspecified: Secondary | ICD-10-CM

## 2018-03-28 ENCOUNTER — Other Ambulatory Visit (INDEPENDENT_AMBULATORY_CARE_PROVIDER_SITE_OTHER): Payer: Self-pay | Admitting: Otolaryngology

## 2018-03-28 ENCOUNTER — Ambulatory Visit
Admission: RE | Admit: 2018-03-28 | Discharge: 2018-03-28 | Disposition: A | Discharge status: Home / Self Care | Payer: Medicaid Other | Source: Ambulatory Visit | Attending: Otolaryngology | Admitting: Otolaryngology

## 2018-03-28 DIAGNOSIS — J329 Chronic sinusitis, unspecified: Secondary | ICD-10-CM

## 2018-04-17 ENCOUNTER — Encounter: Payer: Self-pay | Admitting: Family Medicine

## 2018-04-17 ENCOUNTER — Ambulatory Visit (INDEPENDENT_AMBULATORY_CARE_PROVIDER_SITE_OTHER): Payer: Medicare Other | Admitting: Family Medicine

## 2018-04-17 ENCOUNTER — Other Ambulatory Visit: Payer: Self-pay

## 2018-04-17 VITALS — BP 122/82 | HR 83 | Temp 99.3°F | Resp 16 | Ht 63.5 in | Wt 202.2 lb

## 2018-04-17 DIAGNOSIS — R7303 Prediabetes: Secondary | ICD-10-CM | POA: Insufficient documentation

## 2018-04-17 DIAGNOSIS — Z1211 Encounter for screening for malignant neoplasm of colon: Secondary | ICD-10-CM | POA: Diagnosis not present

## 2018-04-17 DIAGNOSIS — R635 Abnormal weight gain: Secondary | ICD-10-CM | POA: Diagnosis not present

## 2018-04-17 DIAGNOSIS — E785 Hyperlipidemia, unspecified: Secondary | ICD-10-CM

## 2018-04-17 DIAGNOSIS — Z131 Encounter for screening for diabetes mellitus: Secondary | ICD-10-CM | POA: Diagnosis not present

## 2018-04-17 DIAGNOSIS — J9801 Acute bronchospasm: Secondary | ICD-10-CM | POA: Insufficient documentation

## 2018-04-17 DIAGNOSIS — Z1231 Encounter for screening mammogram for malignant neoplasm of breast: Secondary | ICD-10-CM | POA: Diagnosis not present

## 2018-04-17 DIAGNOSIS — Z1239 Encounter for other screening for malignant neoplasm of breast: Secondary | ICD-10-CM

## 2018-04-17 DIAGNOSIS — M25561 Pain in right knee: Secondary | ICD-10-CM

## 2018-04-17 LAB — POCT GLYCOSYLATED HEMOGLOBIN (HGB A1C): Hemoglobin A1C: 6 % — AB (ref 4.0–5.6)

## 2018-04-17 MED ORDER — METFORMIN HCL 500 MG PO TABS
500.0000 mg | ORAL_TABLET | Freq: Every day | ORAL | 0 refills | Status: DC
Start: 2018-04-17 — End: 2018-07-10

## 2018-04-17 MED ORDER — ALBUTEROL SULFATE HFA 108 (90 BASE) MCG/ACT IN AERS: 2.0000 | INHALATION_SPRAY | RESPIRATORY_TRACT | 3 refills | Status: DC | PRN

## 2018-04-17 MED ORDER — MELOXICAM 7.5 MG PO TABS: 7.5000 mg | ORAL_TABLET | Freq: Every day | ORAL | 3 refills | Status: DC

## 2018-04-17 MED FILL — MELOXICAM 7.5 MG TABLET: 7.5 | 30 days supply | Qty: 30 | Fill #0

## 2018-04-17 MED FILL — metFORMIN HCL 500 MG TABS: 500 | 90 days supply | Qty: 90 | Fill #0

## 2018-04-17 NOTE — Assessment & Plan Note (Signed)
Discussed lipid metabolism Reviewed the previous lipid panel which had elevated LDL Discussed that she should stop grapefruits as it affects her CYP enzymes

## 2018-04-17 NOTE — Assessment & Plan Note (Addendum)
Discussed a1c to screen for DM Discussed family history and risk factor reduction Given weight gain and recent steroid use prescribed METFORMIN once daily with breakfast. Discussed that she should stop grapefruits as it affects her CYP enzymes Discussed risks and benefits Discussed exercises for weight loss Return in one month

## 2018-04-17 NOTE — Assessment & Plan Note (Signed)
Discussed weight loss strategies Referred to Nutrition Discussed weight mgmt and metformin for weight loss

## 2018-04-17 NOTE — Assessment & Plan Note (Signed)
Improved after steroids, flonase. Refilled prn albuterol

## 2018-04-17 NOTE — Progress Notes (Signed)
Chief Complaint  Patient presents with  . Weight Loss    one month f/u  . Medication Refill    albuterol inhaler, meloxicam    HPI   Weight gain Patient is here for weight loss follow up  She was put on prednisone by ENT and had increased weight She has been trying to lose weight but struggling with weight loss  Wt Readings from Last 3 Encounters:  04/17/18 202 lb 3.2 oz (91.7 kg)  03/20/18 200 lb 3.2 oz (90.8 kg)  02/21/18 190 lb (86.2 kg)   This morning she is fasting but for breakfast she typically has 2 eggs, sausage patty, juice or sometimes the sausage mcgriddle from [employer].  She has a salad for lunch and a sensible dinner. She is trying to drink more water  She denies family history of thyroid disease.   Prediabetes- new diagnosis Pt was screened today for prediabetes She has a family history of type 2 diabetes in her brother who is now on HD due to renal failure She reports that she does not recall gestational diabetes Lab Results  Component Value Date   HGBA1C 6.0 (A) 04/17/2018   She denies polyuria, polydipsia and polyphagia.  She was on prednisone for sinusitis but is now off prednisone.   Osteoarthritis She reports that she has been getting up and down better since getting the shot  She states that she has improvement in her right knee She reports that she is still stiff especially when she gets up from her chair She denies any swelling of her knees   Bronchospasm She reports that she is using the albuterol much less now that she treated her sinusitis She would like a refill to keep She finished a steroid taper in May and is now much better She was seen by ENT She is still on flonase but does not use it daily.    Past Medical History:  Diagnosis Date  . Hypertension     Current Outpatient Medications  Medication Sig Dispense Refill  . albuterol (PROVENTIL HFA;VENTOLIN HFA) 108 (90 Base) MCG/ACT inhaler Inhale 2 puffs into the lungs every 4  (four) hours as needed for wheezing or shortness of breath (cough, shortness of breath or wheezing.). 1 Inhaler 3  . amLODipine (NORVASC) 10 MG tablet Take 1 tablet (10 mg total) by mouth daily. 90 tablet 1  . atorvastatin (LIPITOR) 20 MG tablet Take 1 tablet (20 mg total) by mouth daily. 90 tablet 3  . cetirizine (ZYRTEC) 10 MG tablet Take 1 tablet (10 mg total) by mouth daily. 30 tablet 11  . Fexofenadine HCl (MUCINEX ALLERGY PO) Take by mouth.    . losartan (COZAAR) 50 MG tablet Take 1 tablet (50 mg total) by mouth daily. 90 tablet 1  . meloxicam (MOBIC) 7.5 MG tablet Take 1 tablet (7.5 mg total) by mouth daily. Take with pepcid and food. 30 tablet 3  . metFORMIN (GLUCOPHAGE) 500 MG tablet Take 1 tablet (500 mg total) by mouth daily with breakfast. 90 tablet 0   No current facility-administered medications for this visit.     Allergies: No Known Allergies  No past surgical history on file.  Social History   Socioeconomic History  . Marital status: Divorced    Spouse name: Not on file  . Number of children: Not on file  . Years of education: Not on file  . Highest education level: Not on file  Occupational History  . Not on file  Social Needs  .  Financial resource strain: Not on file  . Food insecurity:    Worry: Not on file    Inability: Not on file  . Transportation needs:    Medical: Not on file    Non-medical: Not on file  Tobacco Use  . Smoking status: Never Smoker  . Smokeless tobacco: Never Used  Substance and Sexual Activity  . Alcohol use: No  . Drug use: Never  . Sexual activity: Not on file  Lifestyle  . Physical activity:    Days per week: Not on file    Minutes per session: Not on file  . Stress: Not on file  Relationships  . Social connections:    Talks on phone: Not on file    Gets together: Not on file    Attends religious service: Not on file    Active member of club or organization: Not on file    Attends meetings of clubs or organizations: Not  on file    Relationship status: Not on file  Other Topics Concern  . Not on file  Social History Narrative  . Not on file    No family history on file.   ROS Review of Systems See HPI Constitution: No fevers or chills No malaise No diaphoresis Skin: No rash or itching Eyes: no blurry vision, no double vision GU: no dysuria or hematuria Neuro: no dizziness or headaches all others reviewed and negative   Objective: Vitals:   04/17/18 0852  BP: 122/82  Pulse: 83  Resp: 16  Temp: 99.3 F (37.4 C)  TempSrc: Oral  SpO2: 98%  Weight: 202 lb 3.2 oz (91.7 kg)  Height: 5' 3.5" (1.613 m)    Physical Exam  Constitutional: She is oriented to person, place, and time. She appears well-developed and well-nourished.  HENT:  Head: Normocephalic and atraumatic.  Eyes: Conjunctivae and EOM are normal.  Neck: Normal range of motion. Neck supple.  Cardiovascular: Normal rate, regular rhythm and normal heart sounds.  No murmur heard. Pulmonary/Chest: Effort normal and breath sounds normal. No stridor. No respiratory distress. She has no wheezes. She has no rales.  Musculoskeletal:  OA changes of both knees bilaterally with bilateral crepitus. No effusion. nontender to palpation.   Neurological: She is alert and oriented to person, place, and time.  Skin: Skin is warm. Capillary refill takes less than 2 seconds.  Psychiatric: She has a normal mood and affect. Her behavior is normal. Judgment and thought content normal.    Assessment and Plan  Problem List Items Addressed This Visit      Other   Acute pain of right knee    Refilled meloxicam Discussed chair exercises to avoid impact on the knees      Relevant Medications   meloxicam (MOBIC) 7.5 MG tablet   Weight gain - Primary    Discussed weight loss strategies Referred to Nutrition Discussed weight mgmt and metformin for weight loss       Relevant Orders   TSH   Dyslipidemia    Discussed lipid metabolism Reviewed  the previous lipid panel which had elevated LDL Discussed that she should stop grapefruits as it affects her CYP enzymes      Relevant Orders   Lipid panel   TSH   Comprehensive metabolic panel   Prediabetes    Discussed a1c to screen for DM Discussed family history and risk factor reduction Given weight gain and recent steroid use prescribed METFORMIN once daily with breakfast. Discussed that she should stop grapefruits  as it affects her CYP enzymes Discussed risks and benefits Discussed exercises for weight loss Return in one month      Relevant Medications   metFORMIN (GLUCOPHAGE) 500 MG tablet   Other Relevant Orders   Ambulatory referral to diabetic education   Bronchospasm    Improved after steroids, flonase. Refilled prn albuterol        Other Visit Diagnoses    Screening for colon cancer       Relevant Orders   Ambulatory referral to Gastroenterology   Screening for diabetes mellitus (DM)       Relevant Orders   POCT glycosylated hemoglobin (Hb A1C) (Completed)   Screening for malignant neoplasm of breast       Relevant Orders   MM Digital Screening      A total of 45 minutes were spent face-to-face with the patient during this encounter and over half of that time was spent on counseling and coordination of care.  [place]

## 2018-04-17 NOTE — Patient Instructions (Addendum)
From [employer] . Last Reviewed: July 08, 2016  . Last Edited: August 30, 2016   Protein Foods Foods high in protein such as fish, chicken, meats, soy products, and cheese, are all called "protein foods." You may also hear them referred to as 'meats or meat substitutes." The biggest difference among foods in this group is how much fat they contain, and for the vegetarian proteins, whether they have carbohydrate.  Protein Choices Plant-Based Proteins Plant-based protein foods provide quality protein, healthy fats, and fiber. They vary in how much fat and carbohydrate they contain, so make sure to read labels. . Beans such as black, kidney, and pinto  . Bean products like baked beans and refried beans  . Hummus and falafel  . Lentils such as brown, green, or yellow  . Peas such as black-eyed or split peas  . Edamame  . Soy nuts  . Nuts and spreads like almond butter, cashew butter, or peanut butter  . Tempeh, tofu  . Products like meatless "chicken" nuggets, "beef" crumbles, "burgers", "bacon", "sausage", and "hot dogs"  Fish and Seafood Try to include fish at least 2 times per week. . Fish high in omega-3 fatty acids like [place] tuna, herring, mackerel, rainbow trout, sardines, and salmon  . Other fish including catfish, cod, flounder, haddock, halibut, orange roughy, and tilapia  . Shellfish including clams, crab, imitation shellfish, lobster, scallops, shrimp, oysters.  Agricultural consultant without the skin for less saturated fat and cholesterol. . Chicken, [place], cornish hen  Cheese and Eggs . Reduced-fat cheese or regular cheese in small amounts  . Cottage cheese  . Whole eggs  Game . Buffalo, ostrich, rabbit, venison  . Dove, duck, goose, or pheasant (no skin)  Beef, Pork, Veal, Lamb It's best to limit your intake of red meat which is often higher in saturated fat and processed meats like ham, bacon and hot dogs which are often higher in saturated fat and  sodium. If you decide to have these, choose the leanest options, which are: . Select or Choice grades of beef trimmed of fat including: chuck, rib, rump roast, round, sirloin, cubed, flank, porterhouse, T-bone steak, tenderloin  . Lamb: chop, leg, or roast  . Veal: loin chop or roast  . Pork: Canadian bacon, center loin chop, ham, tenderloin   Low Glycemic Foods (20-49)  Breakfast Cereals: All-Bran                All-Bran Fruit ' n Oats Fiber One               Oatmeal (not instant)  Oat bran Fruits and fruit juices: (Limit to 1-2 servings per day) Apples               Apricots (fresh & dried)  Blackberries            Blueberries Cherries                  Cranberries             Peaches                  Pears                       Plums                       Prunes Grapefruit  Raspberries            Strawberries           Tangerine Apple juice             Grapefruit juice Tomato juice  Beans and legumes (fresh-cooked): Black-eyed peas     Butter beans Chick peas              Lentils     Green beans           Lima beans               Kidney beans          Navy beans  Non-starchy vegetables: Asparagus, bok choy, broccoli, cabbage, cauliflower, celery, cucumber, greens, lettuce, mushrooms, peppers, tomatoes, okra, onions, snow peas, spinach, summer squash  Grains: Barley                                Bulgur Rye                                    Wild rice  Nuts and oils : Almonds         Peanuts     Sunflower seeds  Hazelnuts      Pecans          Walnuts Oils that are liquid at room temperature  Dairy, fish, and meat: Milk, skim                         Lowfat cheese Yogurt, lowfat, fruit sugar sweetened Lean red meat                      Fish  Skinless chicken & [place]    Shellfish Moderate Glycemic Foods (50-69)  Breakfast Cereals: Bran Buds                             Bran Chex Just Right                            Mini-Wheats Special K          Swiss muesli  Fruits: Banana (under-ripe)             Dates Figs                                      Grapes Kiwi                                      Mango Oranges                               Raisins  Fruit Juices: Cranberry juice                    Orange juice  Beans and legumes: [place]-type baked beans Canned pinto, kidney, or navy beans Green peas  Vegetables: Beets  Raw Carrots  Sweet potato              Yam Corn on the cob  Breads: Pita (pocket) bread          Oat bran bread Pumpernickel bread           Rye bread Wheat bread, high fiber       Grains: Cornmeal                           Rice, brown   Rice, white                         Couscous Pasta: Macaroni                           Pizza  cheese Raviolimeat filled           Spaghetti, white        Nuts: Cashews                           Macadamia  Snacks: Chocolate                    Ice cream,lowfat  Muffin                               Popcorn High Glycemic Foods (70-100)   Breakfast Cereals: Cheerios                 Corn Chex Corn Flakes            Cream of Wheat Grape Nuts              Grape Nut Flakes Life                 Nutri-Grain       Puffed Rice               Puffed Wheat Rice Chex                 Rice Krispies Shredded Wheat             Team Total Fruits: Pineapple                 Watermelon Banana (over-ripe)  Beverages: Sodas, sweet tea, pineapple juice  Vegetables: Potato, baked, boiled, fried, mashed [place] fries Canned or frozen corn Cooked carrots Parsnips Winter squash  Breads: Most breads (white and whole grain) Bagels                     Bread sticks Bread stuffing          Kaiser roll Dinner rolls  Grains: Rice, instant          Tapioca, with milk  Candy and most cookies Snacks: Donuts                      Corn chips        Jelly beans                 Pretzels Pastries                             Restaurant and ethnic  foods Most [place]  food (sugar in stir fry or wok sauces) Teriyaki-style meats and vegetables       IF you received an x-ray today, you will receive an invoice from [facility]. Please contact [facility] at [phone] with questions or concerns regarding your invoice.   IF you received labwork today, you will receive an invoice from [facility]. Please contact [facility] at [phone] with questions or concerns regarding your invoice.   Our billing staff will not be able to assist you with questions regarding bills from these companies.  You will be contacted with the lab results as soon as they are available. The fastest way to get your results is to activate your My Chart account. Instructions are located on the last page of this paperwork. If you have not heard from [place] regarding the results in 2 weeks, please contact this office.    We recommend that you schedule a mammogram for breast cancer screening. Typically, you do not need a referral to do this. Please contact a local imaging center to schedule your mammogram.  [facility] - [phone]  *ask for the Radiology Department [facility] ([place]) - [phone] or [phone]  [facility] - [phone] [place] [phone] [facility] - [phone]  *ask for the [facility] - [phone]  *ask for the Radiology Department [facility] - [phone]  *ask for the [facility] - [phone]

## 2018-04-17 NOTE — Assessment & Plan Note (Signed)
Refilled meloxicam Discussed chair exercises to avoid impact on the knees

## 2018-04-18 LAB — LIPID PANEL
Chol/HDL Ratio: 2.5 ratio (ref 0.0–4.4)
Cholesterol, Total: 209 mg/dL — ABNORMAL HIGH (ref 100–199)
HDL: 83 mg/dL (ref >39)
LDL CALC: 115 mg/dL — AB (ref 0–99)
Triglycerides: 57 mg/dL (ref 0–149)
VLDL CHOLESTEROL CAL: 11 mg/dL (ref 5–40)

## 2018-04-18 LAB — COMPREHENSIVE METABOLIC PANEL
ALBUMIN: 4.2 g/dL (ref 3.6–4.8)
ALT: 11 IU/L (ref 0–32)
AST: 21 IU/L (ref 0–40)
Albumin/Globulin Ratio: 1.3 (ref 1.2–2.2)
Alkaline Phosphatase: 81 IU/L (ref 39–117)
BUN/Creatinine Ratio: 17 (ref 12–28)
BUN: 16 mg/dL (ref 8–27)
Bilirubin Total: 0.8 mg/dL (ref 0.0–1.2)
CALCIUM: 9.6 mg/dL (ref 8.7–10.3)
CO2: 24 mmol/L (ref 20–29)
CREATININE: 0.96 mg/dL (ref 0.57–1.00)
Chloride: 105 mmol/L (ref 96–106)
GFR calc Af Amer: 71 mL/min/{1.73_m2} (ref >59)
GFR, EST NON AFRICAN AMERICAN: 61 mL/min/{1.73_m2} (ref >59)
GLOBULIN, TOTAL: 3.2 g/dL (ref 1.5–4.5)
GLUCOSE: 92 mg/dL (ref 65–99)
Potassium: 4.5 mmol/L (ref 3.5–5.2)
SODIUM: 144 mmol/L (ref 134–144)
Total Protein: 7.4 g/dL (ref 6.0–8.5)

## 2018-04-18 LAB — TSH: TSH: 2.4 u[IU]/mL (ref 0.450–4.500)

## 2018-04-18 MED FILL — PROAIR HFA 90 MCG INHALER: 108 (90 BAS | 17 days supply | Qty: 9 | Fill #0

## 2018-04-19 ENCOUNTER — Encounter: Payer: Self-pay | Admitting: Gastroenterology

## 2018-04-25 ENCOUNTER — Other Ambulatory Visit: Payer: Self-pay | Admitting: Otolaryngology

## 2018-04-25 ENCOUNTER — Other Ambulatory Visit: Payer: Self-pay | Admitting: Urgent Care

## 2018-04-25 DIAGNOSIS — J321 Chronic frontal sinusitis: Secondary | ICD-10-CM | POA: Diagnosis not present

## 2018-04-25 DIAGNOSIS — J323 Chronic sphenoidal sinusitis: Secondary | ICD-10-CM | POA: Diagnosis not present

## 2018-04-25 DIAGNOSIS — J320 Chronic maxillary sinusitis: Secondary | ICD-10-CM | POA: Diagnosis not present

## 2018-04-25 DIAGNOSIS — J322 Chronic ethmoidal sinusitis: Secondary | ICD-10-CM | POA: Diagnosis not present

## 2018-04-25 MED FILL — LOSARTAN POTASSIUM 50 MG TA: 50 | 30 days supply | Qty: 30 | Fill #0

## 2018-04-25 MED FILL — ATORVASTATIN 20 MG TABLET: 20 | 90 days supply | Qty: 90 | Fill #2

## 2018-05-08 ENCOUNTER — Encounter: Payer: Medicare Other | Attending: Family Medicine | Admitting: Registered"

## 2018-05-08 ENCOUNTER — Encounter: Payer: Self-pay | Admitting: Registered"

## 2018-05-08 DIAGNOSIS — Z713 Dietary counseling and surveillance: Secondary | ICD-10-CM | POA: Insufficient documentation

## 2018-05-08 DIAGNOSIS — R7303 Prediabetes: Secondary | ICD-10-CM | POA: Diagnosis not present

## 2018-05-08 NOTE — Patient Instructions (Addendum)
-   Check into Silver Sneakers program with [employer]; water aerobics classes.   - Increase physical activity to at least 15-30 min/day, 4 days/week.   - Aim to increase meals by adding in breakfast with egg + toast, sausage + oatmeal/grits, etc.   - Increase fiber intake by eating non-starchy vegetables with lunch and dinner.   - Pair carbohydrates with protein.   - Aim to eat balanced meals. Refer to My Plate handout.

## 2018-05-08 NOTE — Progress Notes (Signed)
  Medical Nutrition Therapy:  Appt start time: 8:45 end time:  9:50.   Assessment:  Primary concerns today: Pt states she needs to have hip replacement surgery and needs to lose some weight.   Pt expectations: knowing what to eat, learn how to read labels with sodium and sugar  Lab results show A1c 6.0, Chol 209 (04/17/2018)  Pt arrives with daughter and granddaughter. Pt states she does not eat often and when she does it is not nutritious. Pt states daughter will not let her buy or eat sweets such as cookies. Pt states she wants to start attending nearby [employer] [facility] [employer] [facility] [employer] [facility] [employer] [employer] [facility]. Pt states her hip limits her with walking as physical activity and states primary care provider encouraged her to use stationary bike or engage in upper body exercises. Pt was encouraged to follow recommendations. Pt states she has energy when she initially wakes up but becomes more drained as the day goes on. Pt states she is expecting to have hip replacement surgery in July/Aug 2019. Pt states she has not been making meals at home lately. Pt states she likes to eat almonds with dried cranberries.    Preferred Learning Style:   No preference indicated   Learning Readiness:   Ready  Change in progress   MEDICATIONS: See list   DIETARY INTAKE:  Usual eating pattern includes 1 meals and 1-2 snacks per day.  Everyday foods include chicken, fish, [place], sometimes pork or beef.  Avoided foods include sweets and chips.    24-hr recall:  B (8 AM): crackers, cheese, ham Snk (10 AM): none L ( PM): corn on cob, crackers, cheese, [place], small amount of cabbage, macaroni and cheese or sandwich Snk ( PM): cookies or fruit  D ( PM): white cheddar, popcorn, cookie Snk ( PM): none Beverages: water, juice, herbal tea  Usual physical activity: walking   Estimated energy needs: 1600 calories 180 g carbohydrates 120 g protein 44 g fat  Progress Towards Goal(s):  In progress.   Nutritional Diagnosis:  NI-5.8.5 Inadeqate fiber  intake As related to less than optimal food-preparation practices.  As evidenced by pt report of reliance on overprocessed foods.    Intervention:  Nutrition education and counseling. Pt was educated and counseled on the importance of eating more than once a day and how it affects our bodies. Pt was also educated on prediabetes, eating well-balanced meals, reading nutrition facts labels, and physical activity. Pt was in agreement with goals listed.  Goals: - Check into Silver Sneakers program with [employer]; water aerobics classes.  - Increase physical activity to at least 15-30 min/day, 4 days/week.  - Aim to increase meals by adding in breakfast with egg + toast, sausage + oatmeal/grits, etc.  - Increase fiber intake by eating non-starchy vegetables with lunch and dinner.  - Pair carbohydrates with protein.  - Aim to eat balanced meals. Refer to My Plate handout.   Teaching Method Utilized:  Visual Auditory Hands on  Handouts given during visit include:  My Plate  Nutrition Facts Label  Barriers to learning/adherence to lifestyle change: contemplative stage of change  Demonstrated degree of understanding via:  Teach Back   Monitoring/Evaluation:  Dietary intake, exercise, and body weight in 2 month(s).

## 2018-05-11 ENCOUNTER — Encounter (HOSPITAL_BASED_OUTPATIENT_CLINIC_OR_DEPARTMENT_OTHER): Payer: Self-pay | Admitting: *Deleted

## 2018-05-15 ENCOUNTER — Ambulatory Visit
Admission: RE | Admit: 2018-05-15 | Discharge: 2018-05-15 | Disposition: A | Discharge status: Home / Self Care | Payer: Medicare Other | Source: Ambulatory Visit | Attending: Family Medicine | Admitting: Family Medicine

## 2018-05-15 ENCOUNTER — Encounter (HOSPITAL_BASED_OUTPATIENT_CLINIC_OR_DEPARTMENT_OTHER)
Admission: RE | Admit: 2018-05-15 | Discharge: 2018-05-15 | Disposition: A | Discharge status: Home / Self Care | Payer: Medicare Other | Source: Ambulatory Visit | Attending: Otolaryngology | Admitting: Otolaryngology

## 2018-05-15 DIAGNOSIS — J342 Deviated nasal septum: Secondary | ICD-10-CM | POA: Diagnosis not present

## 2018-05-15 DIAGNOSIS — J338 Other polyp of sinus: Secondary | ICD-10-CM | POA: Diagnosis not present

## 2018-05-15 DIAGNOSIS — J329 Chronic sinusitis, unspecified: Secondary | ICD-10-CM | POA: Diagnosis not present

## 2018-05-15 DIAGNOSIS — Z1239 Encounter for other screening for malignant neoplasm of breast: Secondary | ICD-10-CM

## 2018-05-15 DIAGNOSIS — Z1231 Encounter for screening mammogram for malignant neoplasm of breast: Secondary | ICD-10-CM | POA: Diagnosis not present

## 2018-05-15 DIAGNOSIS — Z79899 Other long term (current) drug therapy: Secondary | ICD-10-CM | POA: Diagnosis not present

## 2018-05-15 DIAGNOSIS — I10 Essential (primary) hypertension: Secondary | ICD-10-CM | POA: Diagnosis not present

## 2018-05-15 DIAGNOSIS — J343 Hypertrophy of nasal turbinates: Secondary | ICD-10-CM | POA: Diagnosis not present

## 2018-05-15 DIAGNOSIS — E119 Type 2 diabetes mellitus without complications: Secondary | ICD-10-CM | POA: Diagnosis not present

## 2018-05-17 ENCOUNTER — Ambulatory Visit (HOSPITAL_BASED_OUTPATIENT_CLINIC_OR_DEPARTMENT_OTHER): Payer: Medicare Other | Admitting: Anesthesiology

## 2018-05-17 ENCOUNTER — Ambulatory Visit (HOSPITAL_BASED_OUTPATIENT_CLINIC_OR_DEPARTMENT_OTHER)
Admission: RE | Admit: 2018-05-17 | Discharge: 2018-05-17 | Disposition: A | Discharge status: Home / Self Care | Payer: Medicare Other | Source: Ambulatory Visit | Attending: Otolaryngology | Admitting: Otolaryngology

## 2018-05-17 ENCOUNTER — Encounter (HOSPITAL_BASED_OUTPATIENT_CLINIC_OR_DEPARTMENT_OTHER): Payer: Self-pay

## 2018-05-17 ENCOUNTER — Encounter (HOSPITAL_BASED_OUTPATIENT_CLINIC_OR_DEPARTMENT_OTHER)
Admission: RE | Disposition: A | Discharge status: Home / Self Care | Payer: Self-pay | Source: Ambulatory Visit | Attending: Otolaryngology

## 2018-05-17 ENCOUNTER — Other Ambulatory Visit: Payer: Self-pay

## 2018-05-17 DIAGNOSIS — J321 Chronic frontal sinusitis: Secondary | ICD-10-CM

## 2018-05-17 DIAGNOSIS — J322 Chronic ethmoidal sinusitis: Secondary | ICD-10-CM

## 2018-05-17 DIAGNOSIS — J342 Deviated nasal septum: Secondary | ICD-10-CM | POA: Insufficient documentation

## 2018-05-17 DIAGNOSIS — E119 Type 2 diabetes mellitus without complications: Secondary | ICD-10-CM | POA: Diagnosis not present

## 2018-05-17 DIAGNOSIS — J343 Hypertrophy of nasal turbinates: Secondary | ICD-10-CM | POA: Insufficient documentation

## 2018-05-17 DIAGNOSIS — I10 Essential (primary) hypertension: Secondary | ICD-10-CM | POA: Diagnosis not present

## 2018-05-17 DIAGNOSIS — J329 Chronic sinusitis, unspecified: Secondary | ICD-10-CM | POA: Diagnosis not present

## 2018-05-17 DIAGNOSIS — J328 Other chronic sinusitis: Secondary | ICD-10-CM | POA: Diagnosis not present

## 2018-05-17 DIAGNOSIS — J338 Other polyp of sinus: Secondary | ICD-10-CM

## 2018-05-17 DIAGNOSIS — J323 Chronic sphenoidal sinusitis: Secondary | ICD-10-CM

## 2018-05-17 DIAGNOSIS — J320 Chronic maxillary sinusitis: Secondary | ICD-10-CM

## 2018-05-17 DIAGNOSIS — Z79899 Other long term (current) drug therapy: Secondary | ICD-10-CM | POA: Diagnosis not present

## 2018-05-17 HISTORY — PX: SINUS ENDO WITH FUSION: SHX5329

## 2018-05-17 HISTORY — PX: SPHENOIDECTOMY: SHX2421

## 2018-05-17 HISTORY — PX: NASAL SEPTOPLASTY W/ TURBINOPLASTY: SHX2070

## 2018-05-17 HISTORY — PX: ETHMOIDECTOMY: SHX5197

## 2018-05-17 HISTORY — DX: Unspecified osteoarthritis, unspecified site: M19.90

## 2018-05-17 HISTORY — PX: MAXILLARY ANTROSTOMY: SHX2003

## 2018-05-17 HISTORY — PX: FRONTAL SINUS EXPLORATION: SHX6591

## 2018-05-17 HISTORY — DX: Deviated nasal septum: J34.2

## 2018-05-17 LAB — POCT I-STAT, CHEM 8
BUN: 14 mg/dL (ref 8–23)
CALCIUM ION: 1.18 mmol/L (ref 1.15–1.40)
Chloride: 104 mmol/L (ref 98–111)
Creatinine, Ser: 0.8 mg/dL (ref 0.44–1.00)
Glucose, Bld: 129 mg/dL — ABNORMAL HIGH (ref 70–99)
HCT: 34 % — ABNORMAL LOW (ref 36.0–46.0)
Hemoglobin: 11.6 g/dL — ABNORMAL LOW (ref 12.0–15.0)
Potassium: 3.9 mmol/L (ref 3.5–5.1)
SODIUM: 141 mmol/L (ref 135–145)
TCO2: 26 mmol/L (ref 22–32)

## 2018-05-17 LAB — GLUCOSE, CAPILLARY
Glucose-Capillary: 91 mg/dL (ref 70–99)
Glucose-Capillary: 123 mg/dL — ABNORMAL HIGH (ref 70–99)

## 2018-05-17 SURGERY — SEPTOPLASTY, NOSE, WITH NASAL TURBINATE REDUCTION
Anesthesia: General | Site: Nose | Laterality: Right

## 2018-05-17 MED ORDER — SCOPOLAMINE 1 MG/3DAYS TD PT72: 1.0000 | MEDICATED_PATCH | Freq: Once | TRANSDERMAL | Status: DC | PRN

## 2018-05-17 MED ORDER — LIDOCAINE-EPINEPHRINE 1 %-1:100000 IJ SOLN
INTRAMUSCULAR | Status: AC
  Filled 2018-05-17: qty 1

## 2018-05-17 MED ORDER — PROPOFOL 10 MG/ML IV BOLUS
INTRAVENOUS | Status: AC
  Filled 2018-05-17: qty 40

## 2018-05-17 MED ORDER — DEXAMETHASONE SODIUM PHOSPHATE 4 MG/ML IJ SOLN
INTRAMUSCULAR | Status: DC | PRN
  Administered 2018-05-17: 10 mg via INTRAVENOUS

## 2018-05-17 MED ORDER — ONDANSETRON HCL 4 MG/2ML IJ SOLN
INTRAMUSCULAR | Status: AC
  Filled 2018-05-17: qty 2

## 2018-05-17 MED ORDER — OXYCODONE-ACETAMINOPHEN 5-325 MG PO TABS: 1.0000 | ORAL_TABLET | ORAL | 0 refills | Status: DC | PRN

## 2018-05-17 MED ORDER — LIDOCAINE HCL (CARDIAC) PF 100 MG/5ML IV SOSY
PREFILLED_SYRINGE | INTRAVENOUS | Status: DC | PRN
  Administered 2018-05-17: 100 mg via INTRAVENOUS

## 2018-05-17 MED ORDER — ONDANSETRON HCL 4 MG/2ML IJ SOLN
INTRAMUSCULAR | Status: DC | PRN
  Administered 2018-05-17: 4 mg via INTRAVENOUS

## 2018-05-17 MED ORDER — MUPIROCIN 2 % EX OINT
TOPICAL_OINTMENT | CUTANEOUS | Status: AC
  Filled 2018-05-17: qty 22

## 2018-05-17 MED ORDER — LIDOCAINE-EPINEPHRINE 1 %-1:100000 IJ SOLN
INTRAMUSCULAR | Status: DC | PRN
  Administered 2018-05-17: 2.5 mL

## 2018-05-17 MED ORDER — OXYCODONE HCL 5 MG/5ML PO SOLN: 5.0000 mg | Freq: Once | ORAL | Status: DC | PRN

## 2018-05-17 MED ORDER — ROCURONIUM BROMIDE 100 MG/10ML IV SOLN
INTRAVENOUS | Status: DC | PRN
  Administered 2018-05-17: 50 mg via INTRAVENOUS
  Administered 2018-05-17: 30 mg via INTRAVENOUS

## 2018-05-17 MED ORDER — FLUCONAZOLE 150 MG PO TABS: 150.0000 mg | ORAL_TABLET | Freq: Every day | ORAL | 0 refills | Status: AC

## 2018-05-17 MED ORDER — DEXAMETHASONE SODIUM PHOSPHATE 10 MG/ML IJ SOLN
INTRAMUSCULAR | Status: AC
  Filled 2018-05-17: qty 1

## 2018-05-17 MED ORDER — PHENYLEPHRINE HCL 10 MG/ML IJ SOLN
INTRAMUSCULAR | Status: DC | PRN
  Administered 2018-05-17: 120 ug via INTRAVENOUS
  Administered 2018-05-17: 80 ug via INTRAVENOUS
  Administered 2018-05-17: 120 ug via INTRAVENOUS
  Administered 2018-05-17: 80 ug via INTRAVENOUS
  Administered 2018-05-17: 120 ug via INTRAVENOUS
  Administered 2018-05-17: 80 ug via INTRAVENOUS
  Administered 2018-05-17 (×3): 120 ug via INTRAVENOUS
  Administered 2018-05-17: 80 ug via INTRAVENOUS

## 2018-05-17 MED ORDER — MIDAZOLAM HCL 2 MG/2ML IJ SOLN: 1.0000 mg | INTRAMUSCULAR | Status: DC | PRN

## 2018-05-17 MED ORDER — COCAINE HCL 4 % EX SOLN
CUTANEOUS | Status: DC | PRN
  Administered 2018-05-17: 4 mL via TOPICAL

## 2018-05-17 MED ORDER — ROCURONIUM BROMIDE 10 MG/ML (PF) SYRINGE
PREFILLED_SYRINGE | INTRAVENOUS | Status: AC
Start: 2018-05-17 — End: ?
  Filled 2018-05-17: qty 10

## 2018-05-17 MED ORDER — SUGAMMADEX SODIUM 500 MG/5ML IV SOLN
INTRAVENOUS | Status: DC | PRN
  Administered 2018-05-17: 380 mg via INTRAVENOUS

## 2018-05-17 MED ORDER — COCAINE HCL 4 % EX SOLN
CUTANEOUS | Status: AC
  Filled 2018-05-17: qty 4

## 2018-05-17 MED ORDER — HYDROMORPHONE HCL 1 MG/ML IJ SOLN
0.2500 mg | INTRAMUSCULAR | Status: DC | PRN
Start: 2018-05-17 — End: 2018-05-17

## 2018-05-17 MED ORDER — LACTATED RINGERS IV SOLN
INTRAVENOUS | Status: DC
  Administered 2018-05-17 (×4): via INTRAVENOUS

## 2018-05-17 MED ORDER — OXYCODONE HCL 5 MG PO TABS: 5.0000 mg | ORAL_TABLET | Freq: Once | ORAL | Status: DC | PRN

## 2018-05-17 MED ORDER — LIDOCAINE HCL (CARDIAC) PF 100 MG/5ML IV SOSY
PREFILLED_SYRINGE | INTRAVENOUS | Status: AC
Start: 2018-05-17 — End: ?
  Filled 2018-05-17: qty 5

## 2018-05-17 MED ORDER — OXYMETAZOLINE HCL 0.05 % NA SOLN
NASAL | Status: AC
  Filled 2018-05-17: qty 15

## 2018-05-17 MED ORDER — OXYMETAZOLINE HCL 0.05 % NA SOLN
NASAL | Status: DC | PRN
  Administered 2018-05-17: 1 via TOPICAL

## 2018-05-17 MED ORDER — GLYCOPYRROLATE 0.2 MG/ML IJ SOLN
INTRAMUSCULAR | Status: DC | PRN
  Administered 2018-05-17: 0.2 mg via INTRAVENOUS

## 2018-05-17 MED ORDER — FENTANYL CITRATE (PF) 100 MCG/2ML IJ SOLN
50.0000 ug | INTRAMUSCULAR | Status: DC | PRN
  Administered 2018-05-17: 100 ug via INTRAVENOUS

## 2018-05-17 MED ORDER — PROMETHAZINE HCL 25 MG/ML IJ SOLN: 6.2500 mg | INTRAMUSCULAR | Status: DC | PRN

## 2018-05-17 MED ORDER — FENTANYL CITRATE (PF) 100 MCG/2ML IJ SOLN
INTRAMUSCULAR | Status: AC
  Filled 2018-05-17: qty 2

## 2018-05-17 MED ORDER — AMOXICILLIN 875 MG PO TABS: 875.0000 mg | ORAL_TABLET | Freq: Two times a day (BID) | ORAL | 0 refills | Status: AC

## 2018-05-17 MED ORDER — CEFAZOLIN SODIUM-DEXTROSE 2-3 GM-%(50ML) IV SOLR
INTRAVENOUS | Status: DC | PRN
  Administered 2018-05-17: 2 g via INTRAVENOUS

## 2018-05-17 MED ORDER — MUPIROCIN 2 % EX OINT
TOPICAL_OINTMENT | CUTANEOUS | Status: DC | PRN
  Administered 2018-05-17: 1 via TOPICAL

## 2018-05-17 MED ORDER — PROPOFOL 10 MG/ML IV BOLUS
INTRAVENOUS | Status: DC | PRN
  Administered 2018-05-17: 200 mg via INTRAVENOUS

## 2018-05-17 MED ORDER — MEPERIDINE HCL 25 MG/ML IJ SOLN: 6.2500 mg | INTRAMUSCULAR | Status: DC | PRN

## 2018-05-17 MED FILL — OXYCODONE-ACETAMINOPHEN 5-3: 5-325 | 3 days supply | Qty: 20 | Fill #0

## 2018-05-17 MED FILL — FLUCONAZOLE 150 MG TABS: 150 | 7 days supply | Qty: 7 | Fill #0

## 2018-05-17 MED FILL — AMOXICILLIN 875 MG TABLET: 875 | 5 days supply | Qty: 10 | Fill #0

## 2018-05-17 SURGICAL SUPPLY — 63 items
ATTRACTOMAT 16X20 MAGNETIC DRP (DRAPES) IMPLANT
BLADE RAD40 ROTATE 4M 4 5PK (BLADE) IMPLANT
BLADE RAD40 ROTATE 4M 4MM 5PK (BLADE)
BLADE RAD60 ROTATE M4 4 5PK (BLADE) IMPLANT
BLADE RAD60 ROTATE M4 4MM 5PK (BLADE)
BLADE ROTATE RAD 12 4 M4 (BLADE) IMPLANT
BLADE ROTATE RAD 12 4MM M4 (BLADE)
BLADE ROTATE RAD 40 4 M4 (BLADE) IMPLANT
BLADE ROTATE RAD 40 4MM M4 (BLADE)
BLADE ROTATE TRICUT 4MX13CM M4 (BLADE) ×1
BLADE ROTATE TRICUT 4X13 M4 (BLADE) ×4 IMPLANT
BLADE TRICUT ROTATE M4 4 5PK (BLADE) IMPLANT
BLADE TRICUT ROTATE M4 4MM 5PK (BLADE)
BUR HS RAD FRONTAL 3 (BURR) IMPLANT
BUR HS RAD FRONTAL 3MM (BURR)
CANISTER SUC SOCK COL 7IN (MISCELLANEOUS) ×5 IMPLANT
CANISTER SUCT 1200ML W/VALVE (MISCELLANEOUS) ×5 IMPLANT
COAGULATOR SUCT 6 FR SWTCH (ELECTROSURGICAL)
COAGULATOR SUCT 8FR VV (MISCELLANEOUS) ×5 IMPLANT
COAGULATOR SUCT SWTCH 10FR 6 (ELECTROSURGICAL) IMPLANT
DECANTER SPIKE VIAL GLASS SM (MISCELLANEOUS) IMPLANT
DRSG NASAL KENNEDY LMNT 8CM (GAUZE/BANDAGES/DRESSINGS) IMPLANT
DRSG NASOPORE 8CM (GAUZE/BANDAGES/DRESSINGS) ×5 IMPLANT
DRSG TELFA 3X8 NADH (GAUZE/BANDAGES/DRESSINGS) IMPLANT
ELECT REM PT RETURN 9FT ADLT (ELECTROSURGICAL) ×5
ELECTRODE REM PT RTRN 9FT ADLT (ELECTROSURGICAL) ×3 IMPLANT
GLOVE BIO SURGEON STRL SZ7.5 (GLOVE) ×5 IMPLANT
GLOVE ECLIPSE 6.5 STRL STRAW (GLOVE) ×10 IMPLANT
GLOVE SURG SS PI 7.0 STRL IVOR (GLOVE) ×10 IMPLANT
GOWN STRL REUS W/ TWL LRG LVL3 (GOWN DISPOSABLE) ×6 IMPLANT
GOWN STRL REUS W/TWL LRG LVL3 (GOWN DISPOSABLE) ×4
HEMOSTAT SURGICEL 2X14 (HEMOSTASIS) IMPLANT
IV NS 1000ML (IV SOLUTION)
IV NS 1000ML BAXH (IV SOLUTION) IMPLANT
IV NS 500ML (IV SOLUTION) ×2
IV NS 500ML BAXH (IV SOLUTION) ×3 IMPLANT
NEEDLE HYPO 25X1 1.5 SAFETY (NEEDLE) ×5 IMPLANT
NEEDLE PRECISIONGLIDE 27X1.5 (NEEDLE) ×5 IMPLANT
NEEDLE SPNL 25GX3.5 QUINCKE BL (NEEDLE) IMPLANT
NS IRRIG 1000ML POUR BTL (IV SOLUTION) ×5 IMPLANT
PACK BASIN DAY SURGERY FS (CUSTOM PROCEDURE TRAY) ×5 IMPLANT
PACK ENT DAY SURGERY (CUSTOM PROCEDURE TRAY) ×5 IMPLANT
PACKING NASAL EPIS 4X2.4 XEROG (MISCELLANEOUS) IMPLANT
PATTIES SURGICAL .5 X3 (DISPOSABLE) ×5 IMPLANT
SLEEVE SCD COMPRESS KNEE MED (MISCELLANEOUS) IMPLANT
SOLUTION BUTLER CLEAR DIP (MISCELLANEOUS) ×5 IMPLANT
SPLINT NASAL AIRWAY SILICONE (MISCELLANEOUS) IMPLANT
SPONGE GAUZE 2X2 8PLY STER LF (GAUZE/BANDAGES/DRESSINGS) ×1
SPONGE GAUZE 2X2 8PLY STRL LF (GAUZE/BANDAGES/DRESSINGS) ×4 IMPLANT
SPONGE NEURO XRAY DETECT 1X3 (DISPOSABLE) ×5 IMPLANT
SUT CHROMIC 4 0 P 3 18 (SUTURE) ×5 IMPLANT
SUT PLAIN 4 0 ~~LOC~~ 1 (SUTURE) ×5 IMPLANT
SUT PROLENE 3 0 PS 2 (SUTURE) ×5 IMPLANT
SUT VIC AB 4-0 P-3 18XBRD (SUTURE) IMPLANT
SUT VIC AB 4-0 P3 18 (SUTURE)
TOWEL GREEN STERILE FF (TOWEL DISPOSABLE) ×5 IMPLANT
TRACKER ENT INSTRUMENT (MISCELLANEOUS) ×5 IMPLANT
TRACKER ENT PATIENT (MISCELLANEOUS) ×5 IMPLANT
TUBE CONNECTING 20'X1/4 (TUBING) ×1
TUBE CONNECTING 20X1/4 (TUBING) ×4 IMPLANT
TUBE SALEM SUMP 16 FR W/ARV (TUBING) ×5 IMPLANT
TUBING STRAIGHTSHOT EPS 5PK (TUBING) ×5 IMPLANT
YANKAUER SUCT BULB TIP NO VENT (SUCTIONS) ×5 IMPLANT

## 2018-05-17 NOTE — Anesthesia Procedure Notes (Signed)
Procedure Name: Intubation Performed by: Mayme Profeta M, CRNA Pre-anesthesia Checklist: Patient identified, Emergency Drugs available, Suction available, Patient being monitored and Timeout performed Patient Re-evaluated:Patient Re-evaluated prior to induction Oxygen Delivery Method: Circle system utilized Preoxygenation: Pre-oxygenation with 100% oxygen Induction Type: IV induction Ventilation: Mask ventilation without difficulty Laryngoscope Size: Mac and 3 Grade View: Grade I Tube type: Oral Tube size: 7.0 mm Number of attempts: 1 Airway Equipment and Method: Stylet Placement Confirmation: ETT inserted through vocal cords under direct vision,  positive ETCO2,  CO2 detector and breath sounds checked- equal and bilateral Secured at: 21 cm Tube secured with: Tape Dental Injury: Teeth and Oropharynx as per pre-operative assessment        

## 2018-05-17 NOTE — Op Note (Signed)
DATE OF PROCEDURE: 05/17/2018  OPERATIVE REPORT   SURGEON: Leta Baptist, MD   PREOPERATIVE DIAGNOSES:  1. Severe nasal septal deviation.  2. Bilateral inferior turbinate hypertrophy.  3. Chronic nasal obstruction. 4. 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   POSTOPERATIVE DIAGNOSES:  1. Severe nasal septal deviation.  2. Bilateral inferior turbinate hypertrophy.  3. Chronic nasal obstruction. 4. 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  PROCEDURE PERFORMED:  1. Septoplasty.  2. Bilateral partial inferior turbinate resection.  3. Right endoscopic frontal sinusotomy with tissue removal 4. Right endoscopic total ethmoidectomy and sphenoidotomy with tissue removal. 5. Right endoscopic maxillary antrostomy with tissue removal 6. FUSION stereotactic image guidance  ANESTHESIA: General endotracheal tube anesthesia.   COMPLICATIONS: None.   ESTIMATED BLOOD LOSS: 600 mL.   INDICATION FOR PROCEDURE: Barbara Adkins is a 67 y.o. female with a history of chronic nasal obstruction, chronic right-sided pansinusitis and polyposis. The patient was  treated with antihistamine, decongestant, steroid nasal spray, and systemic steroids. However, the patient continued to be symptomatic. On examination, the patient was noted to have bilateral severe inferior turbinate hypertrophy and significant nasal septal deviation, causing significant nasal obstruction. Her CT scan also showed complete opacification of all her right sided paranasal sinuses. She was also noted to have hypodense tissues in the right frontal, ethmoid, maxillary, and sphenoid sinuses. The findings were consistent with fungal infections. Based on the above findings, the decision was made for the patient to undergo the above-stated procedures. The risks, benefits, alternatives, and details of the procedures were discussed with the patient. Questions were invited and answered. Informed consent  was obtained.   DESCRIPTION OF PROCEDURE: The patient was taken to the operating room and placed supine on the operating table. General endotracheal tube anesthesia was administered by the anesthesiologist. The patient was positioned, and prepped and draped in the standard fashion for nasal surgery. Pledgets soaked with Afrin were placed in both nasal cavities for decongestion. The pledgets were subsequently removed. The FUSION stereotactic image guidance marker was placed.The image guidance system was functional throughout the case.  The above mentioned severe septal deviation was noted. 1% lidocaine with 1:100,000 epinephrine was injected onto the nasal septum bilaterally. A hemitransfixion incision was made on the left side. The mucosal flap was carefully elevated on the left side. A cartilaginous incision was made 1 cm superior to the caudal margin of the nasal septum. Mucosal flap was also elevated on the right side in the similar fashion. It should be noted that due to the severe septal deviation, the deviated portion of the cartilaginous and bony septum had to be removed in piecemeal fashion. Once the deviated portions were removed, a straight midline septum was achieved. The septum was then quilted with 4-0 plain gut sutures. The hemitransfixion incision was closed with interrupted 4-0 chromic sutures.   The inferior one half of both hypertrophied inferior turbinate was crossclamped with a Kelly clamp. The inferior one half of each inferior turbinate was then resected with a pair of cross cutting scissors. Hemostasis was achieved with a suction cautery device.  Attention was focused on the right paranasal sinuses. Using a 0 endoscope, the right nasal cavity was examined. A large amount of polypoid tissue was noted to obstruct the right nasal cavity. The polypoid tissue was removed in a piecemeal fashion using a combination of microdebrider, Tru-Cut forceps, and Blakesley forceps. The middle  turbinate was carefully medialized. More polypoid tissue was removed from the middle meatus. The bony  partitions of the anterior and posterior ethmoid cavities were removed using a combination of Tru-Cut forceps and microdebrider. A large amount of fungal balls were noted within the anterior and posterior ethmoid cavities.  The maxillary antrum was then entered and enlarged. A large amount of polypoid tissue and fungus ball were removed from the maxillary sinus. The entrance to the sphenoid sinus was then enlarged. More polypoid tissue and fungus ball were removed from the sphenoid sinuses as well.  Attention was then focused on the frontal sinus. The frontal recess was entered and enlarged by taking down the surrounding bony partitions. More fungus ball was removed. All right-sided paranasal sinuses were copiously irrigated with saline solution. A nasopore packing was placed.  The care of the patient was turned over to the anesthesiologist. The patient was awakened from anesthesia without difficulty. The patient was extubated and transferred to the recovery room in good condition.   OPERATIVE FINDINGS: Severe nasal septal deviation and bilateral inferior turbinate hypertrophy. Right sided chronic pansinusitis with polyposis and fungus ball.  SPECIMEN: Right sinus contents.  FOLLOWUP CARE: The patient be discharged home once she is awake and alert. The patient will be placed on Percocet 1 tablet p.o. q.4 hours p.r.n. pain, amoxicillin 875 mg p.o. b.i.d. for 5 days, and fluconazole for 7 days.. The patient will follow up in my office in 2 days for splint removal.   Barbara Sheer Raynelle Bring, MD

## 2018-05-17 NOTE — Transfer of Care (Signed)
Immediate Anesthesia Transfer of Care Note  Patient: Barbara Adkins  Procedure(s) Performed: NASAL SEPTOPLASTY WITH TURBINATE REDUCTION (Bilateral Nose) RIGHT MAXILLARY ANTROSTOMY WITH TISSUE REMOVAL (Right Nose) RIGHT FRONTAL RECESS EXPLORATION (Right Nose) RIGHT ETHMOIDECTOMY WITH TISSUE REMOVAL (Right Nose) RIGHT SPHENOIDECTOMY WITH TISSUE REMOVAL (Right Nose) SINUS ENDO WITH FUSION (N/A Nose)  Patient Location: PACU  Anesthesia Type:General  Level of Consciousness: awake, alert  and oriented  Airway & Oxygen Therapy: Patient Spontanous Breathing and Patient connected to face mask oxygen  Post-op Assessment: Report given to RN and Post -op Vital signs reviewed and stable  Post vital signs: Reviewed and stable  Last Vitals:  Vitals Value Taken Time  BP 108/65 05/17/2018 11:22 AM  Temp    Pulse 66 05/17/2018 11:25 AM  Resp 15 05/17/2018 11:25 AM  SpO2 100 % 05/17/2018 11:25 AM  Vitals shown include unvalidated device data.  Last Pain:  Vitals:   05/17/18 0730  TempSrc: Oral         Complications: No apparent anesthesia complications

## 2018-05-17 NOTE — H&P (Signed)
Cc: Chronic nasal obstruction, sinonasal polyps  HPI: The patient is a 67 year old female who returns today for her follow-up evaluation.  The patient has a history of chronic right-sided nasal obstruction.  At her last visit 1 month ago, she was noted to have multiple right-sided nasal polyps, resulting in near complete right nasal obstruction.  In addition, she was also noted to have left septal deviation and bilateral inferior turbinate hypertrophy.  The patient was treated with a 12-day course of prednisone and Flonase nasal spray.  She also underwent a sinus CT scan.  The CT showed extensive right-sided sinus disease with hyperdense tissues in the right frontal, ethmoid, maxillary, and sphenoid sinuses.  The findings are concerning for fungal infection.  The patient returns today complaining of persistent right-sided nasal obstruction.  She has also noted intermittent left-sided nasal obstruction.  Currently she denies any fever, purulent drainage, or visual change.    Exam: The nasal cavities were decongested and anesthetised with a combination of oxymetazoline and 4% lidocaine solution.  The flexible scope was inserted into the right nasal cavity. Nasal polyps noted. Endoscopy of the inferior and middle meatus was performed.  Edematous mucosa was noted.  Significant NSD.   Nasopharynx was clear.  Turbinates were hypertrophied but without mass.  Incomplete response to decongestion.  The procedure was repeated on the contralateral side.  No polyp noted on the left. The patient tolerated the procedure well.  Instructions were given to avoid eating or drinking for 2 hours.    Assessment: 1.  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  The CT also showed hyperdense secretions within the right paranasal sinuses, suggesting possible fungal infection.   2.  The patient also has significant nasal septal deviation and bilateral  inferior turbinate hypertrophy.   Plan: 1.  The nasal endoscopy findings and the CT images are reviewed with the patient.  2.  Based on the above findings, the patient will likely benefit from undergoing right-sided endoscopic sinus surgery, septoplasty, and turbinate reduction.  The risks, benefits, and details of the procedures are reviewed with the patient.  Questions are invited and answered.   3.  The patient would like to proceed with the procedures.

## 2018-05-17 NOTE — Anesthesia Preprocedure Evaluation (Signed)
Anesthesia Evaluation  Patient identified by MRN, date of birth, ID band Patient awake    Reviewed: Allergy & Precautions, NPO status , Patient's Chart, lab work & pertinent test results  Airway Mallampati: II  TM Distance: >3 FB Neck ROM: Full    Dental no notable dental hx.    Pulmonary neg pulmonary ROS,    Pulmonary exam normal breath sounds clear to auscultation       Cardiovascular hypertension, Pt. on medications Normal cardiovascular exam Rhythm:Regular Rate:Normal     Neuro/Psych negative neurological ROS  negative psych ROS   GI/Hepatic negative GI ROS, Neg liver ROS,   Endo/Other  diabetes  Renal/GU negative Renal ROS     Musculoskeletal  (+) Arthritis ,   Abdominal   Peds  Hematology negative hematology ROS (+)   Anesthesia Other Findings   Reproductive/Obstetrics negative OB ROS                             Anesthesia Physical Anesthesia Plan  ASA: II  Anesthesia Plan: General   Post-op Pain Management:    Induction: Intravenous  PONV Risk Score and Plan:   Airway Management Planned: Oral ETT  Additional Equipment:   Intra-op Plan:   Post-operative Plan: Extubation in OR  Informed Consent: I have reviewed the patients History and Physical, chart, labs and discussed the procedure including the risks, benefits and alternatives for the proposed anesthesia with the patient or authorized representative who has indicated his/her understanding and acceptance.   Dental advisory given  Plan Discussed with: CRNA  Anesthesia Plan Comments:         Anesthesia Quick Evaluation

## 2018-05-17 NOTE — Discharge Instructions (Addendum)

## 2018-05-19 ENCOUNTER — Encounter (HOSPITAL_BASED_OUTPATIENT_CLINIC_OR_DEPARTMENT_OTHER): Payer: Self-pay | Admitting: Otolaryngology

## 2018-05-19 ENCOUNTER — Other Ambulatory Visit: Payer: Self-pay | Admitting: Family Medicine

## 2018-05-19 ENCOUNTER — Other Ambulatory Visit: Payer: Self-pay | Admitting: Urgent Care

## 2018-05-19 DIAGNOSIS — J320 Chronic maxillary sinusitis: Secondary | ICD-10-CM | POA: Diagnosis not present

## 2018-05-19 DIAGNOSIS — J322 Chronic ethmoidal sinusitis: Secondary | ICD-10-CM | POA: Diagnosis not present

## 2018-05-19 DIAGNOSIS — J321 Chronic frontal sinusitis: Secondary | ICD-10-CM | POA: Diagnosis not present

## 2018-05-19 DIAGNOSIS — J323 Chronic sphenoidal sinusitis: Secondary | ICD-10-CM | POA: Diagnosis not present

## 2018-05-19 MED FILL — AMLODIPINE BESYLATE 10 MG T: 10 | 90 days supply | Qty: 90 | Fill #0

## 2018-05-19 MED FILL — LOSARTAN POTASSIUM 50 MG TA: 50 | 30 days supply | Qty: 30 | Fill #0

## 2018-05-22 ENCOUNTER — Encounter: Payer: Self-pay | Admitting: Family Medicine

## 2018-05-22 ENCOUNTER — Other Ambulatory Visit: Payer: Self-pay

## 2018-05-22 ENCOUNTER — Ambulatory Visit (INDEPENDENT_AMBULATORY_CARE_PROVIDER_SITE_OTHER): Payer: Medicare Other | Admitting: Family Medicine

## 2018-05-22 VITALS — BP 106/73 | HR 77 | Temp 99.3°F | Resp 16 | Ht 63.5 in | Wt 199.0 lb

## 2018-05-22 DIAGNOSIS — R5383 Other fatigue: Secondary | ICD-10-CM | POA: Diagnosis not present

## 2018-05-22 DIAGNOSIS — R42 Dizziness and giddiness: Secondary | ICD-10-CM | POA: Diagnosis not present

## 2018-05-22 DIAGNOSIS — I9589 Other hypotension: Secondary | ICD-10-CM | POA: Diagnosis not present

## 2018-05-22 MED ORDER — AMLODIPINE BESYLATE 5 MG PO TABS: 10.0000 mg | ORAL_TABLET | Freq: Every day | ORAL | Status: DC

## 2018-05-22 MED ORDER — LOSARTAN POTASSIUM 50 MG PO TABS: 50.0000 mg | ORAL_TABLET | Freq: Every day | ORAL | 3 refills | Status: DC

## 2018-05-22 NOTE — Progress Notes (Signed)
Chief Complaint  Patient presents with  . prediabetes    f/u    HPI  Dizziness and Fatigue- new problem Pt is s/p sinus and septal surgery on 05/17/18 She had packing removed on 05/19/18 She is not taking percocet for her pain as prescribed since her pain is tolerable She is currently taking amoxicillin and diflucan postop She reports that she just feels drained She reports that she sleeps well but is so tired since the surgery She reports that she feels like she has anesthesia in her system She reports that she has decreased appetite and has not been eating much   Hypertension: Patient here for follow-up of elevated blood pressure.  She reports that she has been taking amlodipine 10mg  and losartan 50mg   She states that she has not been eating much and feels a little whoozy  BP Readings from Last 3 Encounters:  05/22/18 106/73  05/17/18 104/68  04/17/18 122/82     Past Medical History:  Diagnosis Date  . Arthritis   . Deviated septum   . Hypertension     Current Outpatient Medications  Medication Sig Dispense Refill  . amLODipine (NORVASC) 5 MG tablet Take 2 tablets (10 mg total) by mouth daily.    Marland Kitchen amoxicillin (AMOXIL) 875 MG tablet Take 1 tablet (875 mg total) by mouth 2 (two) times daily for 5 days. 10 tablet 0  . atorvastatin (LIPITOR) 20 MG tablet Take 1 tablet (20 mg total) by mouth daily. 90 tablet 3  . cetirizine (ZYRTEC) 10 MG tablet Take 1 tablet (10 mg total) by mouth daily. 30 tablet 11  . fluconazole (DIFLUCAN) 150 MG tablet Take 1 tablet (150 mg total) by mouth daily for 7 days. 7 tablet 0  . losartan (COZAAR) 50 MG tablet Take 1 tablet (50 mg total) by mouth daily. 30 tablet 3  . metFORMIN (GLUCOPHAGE) 500 MG tablet Take 1 tablet (500 mg total) by mouth daily with breakfast. 90 tablet 0  . omega-3 acid ethyl esters (LOVAZA) 1 g capsule Take by mouth 2 (two) times daily.    Marland Kitchen oxyCODONE-acetaminophen (PERCOCET/ROXICET) 5-325 MG tablet Take 1 tablet by mouth  every 4 (four) hours as needed for severe pain. 20 tablet 0   No current facility-administered medications for this visit.     Allergies: No Known Allergies  Past Surgical History:  Procedure Laterality Date  . ETHMOIDECTOMY Right 05/17/2018   Procedure: RIGHT ETHMOIDECTOMY WITH TISSUE REMOVAL;  Surgeon: Leta Baptist, MD;  Location: [place];  Service: ENT;  Laterality: Right;  . FRONTAL SINUS EXPLORATION Right 05/17/2018   Procedure: RIGHT FRONTAL RECESS EXPLORATION;  Surgeon: Leta Baptist, MD;  Location: [place];  Service: ENT;  Laterality: Right;  . MAXILLARY ANTROSTOMY Right 05/17/2018   Procedure: RIGHT MAXILLARY ANTROSTOMY WITH TISSUE REMOVAL;  Surgeon: Leta Baptist, MD;  Location: [place];  Service: ENT;  Laterality: Right;  . NASAL SEPTOPLASTY W/ TURBINOPLASTY Bilateral 05/17/2018   Procedure: NASAL SEPTOPLASTY WITH TURBINATE REDUCTION;  Surgeon: Leta Baptist, MD;  Location: [place];  Service: ENT;  Laterality: Bilateral;  . SINUS ENDO WITH FUSION N/A 05/17/2018   Procedure: SINUS ENDO WITH FUSION;  Surgeon: Leta Baptist, MD;  Location: [place];  Service: ENT;  Laterality: N/A;  . SPHENOIDECTOMY Right 05/17/2018   Procedure: RIGHT SPHENOIDECTOMY WITH TISSUE REMOVAL;  Surgeon: Leta Baptist, MD;  Location: [place];  Service: ENT;  Laterality: Right;  . TUBAL LIGATION  Social History   Socioeconomic History  . Marital status: Divorced    Spouse name: Not on file  . Number of children: Not on file  . Years of education: Not on file  . Highest education level: Not on file  Occupational History  . Not on file  Social Needs  . Financial resource strain: Not on file  . Food insecurity:    Worry: Not on file    Inability: Not on file  . Transportation needs:    Medical: Not on file    Non-medical: Not on file  Tobacco Use  . Smoking status: Never Smoker  . Smokeless tobacco: Never Used  Substance  and Sexual Activity  . Alcohol use: No  . Drug use: Never  . Sexual activity: Not on file  Lifestyle  . Physical activity:    Days per week: Not on file    Minutes per session: Not on file  . Stress: Not on file  Relationships  . Social connections:    Talks on phone: Not on file    Gets together: Not on file    Attends religious service: Not on file    Active member of club or organization: Not on file    Attends meetings of clubs or organizations: Not on file    Relationship status: Not on file  Other Topics Concern  . Not on file  Social History Narrative  . Not on file    Family History  Problem Relation Age of Onset  . Asthma Other   . Cancer Other   . Hypertension Other   . Diabetes Other   . Breast cancer Neg Hx      ROS Review of Systems See HPI Constitution: No fevers or chills No malaise No diaphoresis Skin: No rash or itching Eyes: no blurry vision, no double vision GU: no dysuria or hematuria Neuro:  + dizziness, she hpi all others reviewed and negative   Objective: Vitals:   05/22/18 0920  BP: 106/73  Pulse: 77  Resp: 16  Temp: 99.3 F (37.4 C)  TempSrc: Oral  SpO2: 100%  Weight: 199 lb (90.3 kg)  Height: 5' 3.5" (1.613 m)    Physical Exam  Constitutional: She is oriented to person, place, and time. She appears well-developed and well-nourished.  HENT:  Head: Normocephalic and atraumatic.  Eyes: Conjunctivae and EOM are normal.  Cardiovascular: Normal rate, regular rhythm and normal heart sounds.  No murmur heard. Pulmonary/Chest: Effort normal and breath sounds normal. No stridor. No respiratory distress. She has no wheezes.  Neurological: She is alert and oriented to person, place, and time.  Psychiatric: She has a normal mood and affect. Her behavior is normal. Judgment and thought content normal.    Assessment and Plan [place] was seen today for prediabetes.  Diagnoses and all orders for this visit:  Iatrogenic  hypotension -  Decreased amlodipine due to signs of hypotension  Dizziness Other fatigue Combination of postop fatigue as well as hypotension from too tightly controlled bp Her next appt with ENT 7/16 Continue rest, hydration and small frequent meals  Other orders -     amLODipine (NORVASC) 5 MG tablet; Take 2 tablets (10 mg total) by mouth daily. -     losartan (COZAAR) 50 MG tablet; Take 1 tablet (50 mg total) by mouth daily.     [place]

## 2018-05-22 NOTE — Anesthesia Postprocedure Evaluation (Signed)
Anesthesia Post Note  Patient: Barbara Adkins  Procedure(s) Performed: NASAL SEPTOPLASTY WITH TURBINATE REDUCTION (Bilateral Nose) RIGHT MAXILLARY ANTROSTOMY WITH TISSUE REMOVAL (Right Nose) RIGHT FRONTAL RECESS EXPLORATION (Right Nose) RIGHT ETHMOIDECTOMY WITH TISSUE REMOVAL (Right Nose) RIGHT SPHENOIDECTOMY WITH TISSUE REMOVAL (Right Nose) SINUS ENDO WITH FUSION (N/A Nose)     Patient location during evaluation: PACU Anesthesia Type: General Level of consciousness: sedated and patient cooperative Pain management: pain level controlled Vital Signs Assessment: post-procedure vital signs reviewed and stable Respiratory status: spontaneous breathing Cardiovascular status: stable Anesthetic complications: no    Last Vitals:  Vitals:   05/17/18 1215 05/17/18 1326  BP: 115/73 104/68  Pulse: 62 60  Resp: 13 15  Temp:  36.6 C  SpO2: 98% 100%    Last Pain:  Vitals:   05/19/18 1107  TempSrc:   PainSc: [place]

## 2018-05-22 NOTE — Patient Instructions (Addendum)
Please call to let us know your blood pressure reading by Thursday.  Your blood pressure was getting too low. Take half the amlodipine and continue your losartan.      IF you received an x-ray today, you will receive an invoice from [facility]. Please contact [facility] at [phone] with questions or concerns regarding your invoice.   IF you received labwork today, you will receive an invoice from [facility]. Please contact [facility] at [phone] with questions or concerns regarding your invoice.   Our billing staff will not be able to assist you with questions regarding bills from these companies.  You will be contacted with the lab results as soon as they are available. The fastest way to get your results is to activate your My Chart account. Instructions are located on the last page of this paperwork. If you have not heard from [place] regarding the results in 2 weeks, please contact this office.

## 2018-05-24 MED FILL — FLUCONAZOLE 150 MG TABS: 150 | 7 days supply | Qty: 7 | Fill #0

## 2018-05-25 ENCOUNTER — Telehealth: Payer: Self-pay | Admitting: Family Medicine

## 2018-05-25 NOTE — Telephone Encounter (Signed)
Called and rescheduled pt for 06/21/18 at 8:40 am. Advised pt of building #, time and late policy.

## 2018-05-30 DIAGNOSIS — J323 Chronic sphenoidal sinusitis: Secondary | ICD-10-CM | POA: Diagnosis not present

## 2018-05-30 DIAGNOSIS — J321 Chronic frontal sinusitis: Secondary | ICD-10-CM | POA: Diagnosis not present

## 2018-05-30 DIAGNOSIS — J320 Chronic maxillary sinusitis: Secondary | ICD-10-CM | POA: Diagnosis not present

## 2018-05-30 DIAGNOSIS — J322 Chronic ethmoidal sinusitis: Secondary | ICD-10-CM | POA: Diagnosis not present

## 2018-06-19 ENCOUNTER — Ambulatory Visit: Payer: Medicaid Other | Admitting: Family Medicine

## 2018-06-20 DIAGNOSIS — J322 Chronic ethmoidal sinusitis: Secondary | ICD-10-CM | POA: Diagnosis not present

## 2018-06-20 DIAGNOSIS — J321 Chronic frontal sinusitis: Secondary | ICD-10-CM | POA: Diagnosis not present

## 2018-06-20 DIAGNOSIS — J323 Chronic sphenoidal sinusitis: Secondary | ICD-10-CM | POA: Diagnosis not present

## 2018-06-20 DIAGNOSIS — J320 Chronic maxillary sinusitis: Secondary | ICD-10-CM | POA: Diagnosis not present

## 2018-06-21 ENCOUNTER — Ambulatory Visit (INDEPENDENT_AMBULATORY_CARE_PROVIDER_SITE_OTHER): Payer: Medicare Other | Admitting: Family Medicine

## 2018-06-21 ENCOUNTER — Encounter: Payer: Self-pay | Admitting: Family Medicine

## 2018-06-21 ENCOUNTER — Other Ambulatory Visit: Payer: Self-pay

## 2018-06-21 VITALS — BP 130/70 | HR 89 | Temp 98.3°F | Resp 17 | Ht 63.5 in | Wt 197.2 lb

## 2018-06-21 DIAGNOSIS — G8929 Other chronic pain: Secondary | ICD-10-CM

## 2018-06-21 DIAGNOSIS — M25561 Pain in right knee: Secondary | ICD-10-CM

## 2018-06-21 DIAGNOSIS — L658 Other specified nonscarring hair loss: Secondary | ICD-10-CM

## 2018-06-21 DIAGNOSIS — I10 Essential (primary) hypertension: Secondary | ICD-10-CM | POA: Diagnosis not present

## 2018-06-21 MED ORDER — LOSARTAN POTASSIUM 50 MG PO TABS: 75.0000 mg | ORAL_TABLET | Freq: Every day | ORAL | 3 refills | Status: DC

## 2018-06-21 MED ORDER — MINOXIDIL 2 % EX SOLN: Freq: Two times a day (BID) | CUTANEOUS | 0 refills | Status: DC

## 2018-06-21 MED FILL — LOSARTAN POTASSIUM 50 MG TA: 50 | 30 days supply | Qty: 45 | Fill #0

## 2018-06-21 NOTE — Progress Notes (Signed)
Chief Complaint  Patient presents with  . Hypertension    follow up.  Pt has questions about if she is to take 5 mg or 10 mg amlodipine and to continue losartan.  Concerns about the thinning of her hair and its green and wonders if the bp med is affecting hair.  Marland Kitchen knee discomfort right knee    pt hoping to get another knee injection to help    HPI Hypertension Pt is her to follow up for hypertension  Last visit 05/22/18 she had hypotension and dizziness with bp as low as 106/73 She was instructed to take half the amlodipine and continue losartan She is concerned that the bp medications is thinning her hair and making it green She is taking amlodipine 10mg  and losartan She is not having any more dizziness She states that she feels fine but is concerned about the issue of her hair thinning  Arthritis She is concerned about her right knee pain She has difficulty with ambulation due to the pain and is stiff when she first gets up She reports that orthopedics told her that she would need a knee replacement She cannot stand straight due to the right hip and the right knee also hurts  The steroid shot back in May 2019 was helpful and she is wondering if another one would help.   Thinning hair She gets her hair chemically straightened and then pulled into a top knot Lately her hair has a green color and is rapidly thinning She reports that her sister who wear locs has the same issue She denies any bald spots Takes no other meds   Past Medical History:  Diagnosis Date  . Arthritis   . Deviated septum   . Hypertension     Current Outpatient Medications  Medication Sig Dispense Refill  . atorvastatin (LIPITOR) 20 MG tablet Take 1 tablet (20 mg total) by mouth daily. 90 tablet 3  . cetirizine (ZYRTEC) 10 MG tablet Take 1 tablet (10 mg total) by mouth daily. 30 tablet 11  . losartan (COZAAR) 50 MG tablet Take 1.5 tablets (75 mg total) by mouth daily. 45 tablet 3  . metFORMIN  (GLUCOPHAGE) 500 MG tablet Take 1 tablet (500 mg total) by mouth daily with breakfast. 90 tablet 0  . minoxidil (ROGAINE) 2 % external solution Apply topically 2 (two) times daily. 60 mL 0  . omega-3 acid ethyl esters (LOVAZA) 1 g capsule Take by mouth 2 (two) times daily.    Marland Kitchen oxyCODONE-acetaminophen (PERCOCET/ROXICET) 5-325 MG tablet Take 1 tablet by mouth every 4 (four) hours as needed for severe pain. 20 tablet 0   No current facility-administered medications for this visit.     Allergies: No Known Allergies  Past Surgical History:  Procedure Laterality Date  . ETHMOIDECTOMY Right 05/17/2018   Procedure: RIGHT ETHMOIDECTOMY WITH TISSUE REMOVAL;  Surgeon: Leta Baptist, MD;  Location: [place];  Service: ENT;  Laterality: Right;  . FRONTAL SINUS EXPLORATION Right 05/17/2018   Procedure: RIGHT FRONTAL RECESS EXPLORATION;  Surgeon: Leta Baptist, MD;  Location: [place];  Service: ENT;  Laterality: Right;  . MAXILLARY ANTROSTOMY Right 05/17/2018   Procedure: RIGHT MAXILLARY ANTROSTOMY WITH TISSUE REMOVAL;  Surgeon: Leta Baptist, MD;  Location: [place];  Service: ENT;  Laterality: Right;  . NASAL SEPTOPLASTY W/ TURBINOPLASTY Bilateral 05/17/2018   Procedure: NASAL SEPTOPLASTY WITH TURBINATE REDUCTION;  Surgeon: Leta Baptist, MD;  Location: [place];  Service: ENT;  Laterality:  Bilateral;  . SINUS ENDO WITH FUSION N/A 05/17/2018   Procedure: SINUS ENDO WITH FUSION;  Surgeon: Leta Baptist, MD;  Location: [place];  Service: ENT;  Laterality: N/A;  . SPHENOIDECTOMY Right 05/17/2018   Procedure: RIGHT SPHENOIDECTOMY WITH TISSUE REMOVAL;  Surgeon: Leta Baptist, MD;  Location: [place];  Service: ENT;  Laterality: Right;  . TUBAL LIGATION      Social History   Socioeconomic History  . Marital status: Divorced    Spouse name: Not on file  . Number of children: Not on file  . Years of education: Not on file  . Highest  education level: Not on file  Occupational History  . Not on file  Social Needs  . Financial resource strain: Not on file  . Food insecurity:    Worry: Not on file    Inability: Not on file  . Transportation needs:    Medical: Not on file    Non-medical: Not on file  Tobacco Use  . Smoking status: Never Smoker  . Smokeless tobacco: Never Used  Substance and Sexual Activity  . Alcohol use: No  . Drug use: Never  . Sexual activity: Not on file  Lifestyle  . Physical activity:    Days per week: Not on file    Minutes per session: Not on file  . Stress: Not on file  Relationships  . Social connections:    Talks on phone: Not on file    Gets together: Not on file    Attends religious service: Not on file    Active member of club or organization: Not on file    Attends meetings of clubs or organizations: Not on file    Relationship status: Not on file  Other Topics Concern  . Not on file  Social History Narrative  . Not on file    Family History  Problem Relation Age of Onset  . Asthma Other   . Cancer Other   . Hypertension Other   . Diabetes Other   . Breast cancer Neg Hx      ROS Review of Systems See HPI Constitution: No fevers or chills No malaise No diaphoresis Skin: No rash or itching Eyes: no blurry vision, no double vision GU: no dysuria or hematuria Neuro: no dizziness or headaches all others reviewed and negative   Objective: Vitals:   06/21/18 0857  BP: 130/70  Pulse: 89  Resp: 17  Temp: 98.3 F (36.8 C)  TempSrc: Oral  SpO2: 96%  Weight: 197 lb 3.2 oz (89.4 kg)  Height: 5' 3.5" (1.613 m)    Physical Exam  Constitutional: She is oriented to person, place, and time. She appears well-developed and well-nourished.  HENT:  Head: Normocephalic and atraumatic.  Right Ear: External ear normal.  Left Ear: External ear normal.  Eyes: Conjunctivae and EOM are normal.  Neck: Normal range of motion. Neck supple.  Cardiovascular: Normal rate  and regular rhythm.  No murmur heard. Pulmonary/Chest: Effort normal and breath sounds normal. No stridor. No respiratory distress.  Musculoskeletal: She exhibits edema and tenderness.  Right knee with OA changes with stiffness and small pocket of effusion along the medial joint line, tender to palpation   Neurological: She is alert and oriented to person, place, and time.  Skin: Skin is warm. Capillary refill takes less than 2 seconds.  Scalp visible through thinned hair  Psychiatric: She has a normal mood and affect. Her behavior is normal. Judgment and thought  content normal.    Assessment and Plan [place] was seen today for hypertension and knee discomfort right knee.  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  Traction alopecia- advoid tight hair styles, use cantu shampoo, use low heat, no chemical processing and take biotin supplement Sent in minoxidil  Explained that postmenopausal hair thinning is common -     minoxidil (ROGAINE) 2 % external solution; Apply topically 2 (two) times daily.  Chronic pain of right knee- follow up with Orthopedics about knee replacement     Zoe A Nolon Rod

## 2018-06-21 NOTE — Patient Instructions (Addendum)
  Contact Dr. Rudene Anda office  [facility] [place] Vonna Kotyk, MD Consulting Physician Orthopedic Surgery      Phone: [phone]; Fax: [phone]       IF you received an x-ray today, you will receive an invoice from [facility]. Please contact [facility] at [phone] with questions or concerns regarding your invoice.   IF you received labwork today, you will receive an invoice from [place]. Please contact [facility] at [phone] with questions or concerns regarding your invoice.   Our billing staff will not be able to assist you with questions regarding bills from these companies.  You will be contacted with the lab results as soon as they are available. The fastest way to get your results is to activate your My Chart account. Instructions are located on the last page of this paperwork. If you have not heard from [place] regarding the results in 2 weeks, please contact this office.

## 2018-06-22 ENCOUNTER — Ambulatory Visit (AMBULATORY_SURGERY_CENTER): Payer: Self-pay

## 2018-06-22 VITALS — Ht 63.5 in | Wt 198.8 lb

## 2018-06-22 DIAGNOSIS — Z1211 Encounter for screening for malignant neoplasm of colon: Secondary | ICD-10-CM

## 2018-06-22 MED ORDER — NA SULFATE-K SULFATE-MG SULF 17.5-3.13-1.6 GM/177ML PO SOLN: 1.0000 | Freq: Once | ORAL | 0 refills | Status: AC

## 2018-06-22 MED FILL — SUPREP BOWEL PREP KIT: 17.5-3.13-1 | 1 days supply | Qty: 354 | Fill #0

## 2018-06-22 NOTE — Progress Notes (Signed)
Denies allergies to eggs or soy products. Denies complication of anesthesia or sedation. Denies use of weight loss medication. Denies use of O2.   Emmi instructions declined.  

## 2018-07-03 ENCOUNTER — Ambulatory Visit (AMBULATORY_SURGERY_CENTER): Payer: Medicare Other | Admitting: Gastroenterology

## 2018-07-03 ENCOUNTER — Ambulatory Visit: Payer: Medicare Other | Admitting: Registered"

## 2018-07-03 ENCOUNTER — Encounter: Payer: Self-pay | Admitting: Gastroenterology

## 2018-07-03 VITALS — BP 107/71 | HR 63 | Temp 97.8°F | Resp 16 | Ht 63.5 in | Wt 197.0 lb

## 2018-07-03 DIAGNOSIS — K620 Anal polyp: Secondary | ICD-10-CM | POA: Diagnosis not present

## 2018-07-03 DIAGNOSIS — K621 Rectal polyp: Secondary | ICD-10-CM

## 2018-07-03 DIAGNOSIS — Z1211 Encounter for screening for malignant neoplasm of colon: Secondary | ICD-10-CM

## 2018-07-03 DIAGNOSIS — D122 Benign neoplasm of ascending colon: Secondary | ICD-10-CM

## 2018-07-03 DIAGNOSIS — D128 Benign neoplasm of rectum: Secondary | ICD-10-CM

## 2018-07-03 DIAGNOSIS — K635 Polyp of colon: Secondary | ICD-10-CM | POA: Diagnosis not present

## 2018-07-03 DIAGNOSIS — D123 Benign neoplasm of transverse colon: Secondary | ICD-10-CM

## 2018-07-03 DIAGNOSIS — D129 Benign neoplasm of anus and anal canal: Secondary | ICD-10-CM

## 2018-07-03 MED ORDER — SODIUM CHLORIDE 0.9 % IV SOLN: 500.0000 mL | INTRAVENOUS | Status: DC

## 2018-07-03 NOTE — Patient Instructions (Signed)
  INFORMATION ON POLYPS GIVEN.   YOU HAD AN ENDOSCOPIC PROCEDURE TODAY AT THE [facility]:   Refer to the procedure report that was given to you for any specific questions about what was found during the examination.  If the procedure report does not answer your questions, please call your gastroenterologist to clarify.  If you requested that your care partner not be given the details of your procedure findings, then the procedure report has been included in a sealed envelope for you to review at your convenience later.  YOU SHOULD EXPECT: Some feelings of bloating in the abdomen. Passage of more gas than usual.  Walking can help get rid of the air that was put into your GI tract during the procedure and reduce the bloating. If you had a lower endoscopy (such as a colonoscopy or flexible sigmoidoscopy) you may notice spotting of blood in your stool or on the toilet paper. If you underwent a bowel prep for your procedure, you may not have a normal bowel movement for a few days.  Please Note:  You might notice some irritation and congestion in your nose or some drainage.  This is from the oxygen used during your procedure.  There is no need for concern and it should clear up in a day or so.  SYMPTOMS TO REPORT IMMEDIATELY:   Following lower endoscopy (colonoscopy or flexible sigmoidoscopy):  Excessive amounts of blood in the stool  Significant tenderness or worsening of abdominal pains  Swelling of the abdomen that is new, acute  Fever of 100F or higher    For urgent or emergent issues, a gastroenterologist can be reached at any hour by calling [phone].   DIET:  We do recommend a small meal at first, but then you may proceed to your regular diet.  Drink plenty of fluids but you should avoid alcoholic beverages for 24 hours.  ACTIVITY:  You should plan to take it easy for the rest of today and you should NOT DRIVE or use heavy machinery until tomorrow (because of the sedation  medicines used during the test).    FOLLOW UP: Our staff will call the number listed on your records the next business day following your procedure to check on you and address any questions or concerns that you may have regarding the information given to you following your procedure. If we do not reach you, we will leave a message.  However, if you are feeling well and you are not experiencing any problems, there is no need to return our call.  We will assume that you have returned to your regular daily activities without incident.  If any biopsies were taken you will be contacted by phone or by letter within the next 1-3 weeks.  Please call us at [phone] if you have not heard about the biopsies in 3 weeks.    SIGNATURES/CONFIDENTIALITY: You and/or your care partner have signed paperwork which will be entered into your electronic medical record.  These signatures attest to the fact that that the information above on your After Visit Summary has been reviewed and is understood.  Full responsibility of the confidentiality of this discharge information lies with you and/or your care-partner. 

## 2018-07-03 NOTE — Progress Notes (Signed)
Report to PACU, RN, vss, BBS= Clear.  

## 2018-07-03 NOTE — Progress Notes (Signed)
Pt's states no medical or surgical changes since previsit or office visit. 

## 2018-07-03 NOTE — Progress Notes (Signed)
Called to room to assist during endoscopic procedure.  Patient ID and intended procedure confirmed with present staff. Received instructions for my participation in the procedure from the performing physician.  

## 2018-07-03 NOTE — Op Note (Signed)
[facility] Patient Name: Barbara Adkins Procedure Date: 07/03/2018 11:09 AM MRN: [id number] Endoscopist: Barbara Pole , MD Age: 67 Referring MD:  Date of Birth: 1951-03-21 Gender: Female Account #: [id number] Procedure:                Colonoscopy Indications:              Screening for colorectal malignant neoplasm Medicines:                Monitored Anesthesia Care Procedure:                Pre-Anesthesia Assessment:                           - Prior to the procedure, a History and Physical                            was performed, and patient medications and                            allergies were reviewed. The patient's tolerance of                            previous anesthesia was also reviewed. The risks                            and benefits of the procedure and the sedation                            options and risks were discussed with the patient.                            All questions were answered, and informed consent                            was obtained. Prior Anticoagulants: The patient has                            taken no previous anticoagulant or antiplatelet                            agents. ASA Grade Assessment: II - A patient with                            mild systemic disease. After reviewing the risks                            and benefits, the patient was deemed in                            satisfactory condition to undergo the procedure.                           After obtaining informed consent, the colonoscope  was passed under direct vision. Throughout the                            procedure, the patient's blood pressure, pulse, and                            oxygen saturations were monitored continuously. The                            Model PCF-H190DL [id number]) scope was introduced                            through the anus and advanced to the the cecum,   identified by appendiceal orifice and ileocecal                            valve. The colonoscopy was performed without                            difficulty. The patient tolerated the procedure                            well. The quality of the bowel preparation was                            good. The ileocecal valve, appendiceal orifice, and                            rectum were photographed. Scope In: 11:24:09 AM Scope Out: 11:44:41 AM Scope Withdrawal Time: 0 hours 9 minutes 42 seconds  Total Procedure Duration: 0 hours 20 minutes 32 seconds  Findings:                 The perianal and digital rectal examinations were                            normal.                           Three sessile polyps were found in the rectum,                            transverse colon and ascending colon. The polyps                            were 1 to 3 mm in size. These polyps were removed                            with a cold biopsy forceps. Resection and retrieval                            were complete.                           Non-bleeding internal hemorrhoids were found during  retroflexion. The hemorrhoids were large.                           The exam was otherwise without abnormality. Complications:            No immediate complications. Estimated Blood Loss:     Estimated blood loss was minimal. Impression:               - Three 1 to 3 mm polyps in the rectum, in the                            transverse colon and in the ascending colon,                            removed with a cold biopsy forceps. Resected and                            retrieved.                           - Non-bleeding internal hemorrhoids.                           - The examination was otherwise normal. Recommendation:           - Patient has a contact number available for                            emergencies. The signs and symptoms of potential                            delayed  complications were discussed with the                            patient. Return to normal activities tomorrow.                            Written discharge instructions were provided to the                            patient.                           - Resume previous diet.                           - Continue present medications.                           - Await pathology results.                           - Repeat colonoscopy in 5-10 years for surveillance                            based on pathology results. Barbara Pole, MD 07/03/2018 11:47:59 AM This report has been signed electronically.

## 2018-07-04 ENCOUNTER — Telehealth: Payer: Self-pay

## 2018-07-04 NOTE — Telephone Encounter (Signed)
  Follow up Call-  Call back number 07/03/2018  Post procedure Call Back phone  # [phone]  Permission to leave phone message No  Some recent data might be hidden     Patient questions:  Do you have a fever, pain , or abdominal swelling? No. Pain Score  0 *  Have you tolerated food without any problems? Yes.    Have you been able to return to your normal activities? Yes.    Do you have any questions about your discharge instructions: Diet   No. Medications  No. Follow up visit  No.  Do you have questions or concerns about your Care? No.  Actions: * If pain score is 4 or above: No action needed, pain <4.

## 2018-07-10 ENCOUNTER — Other Ambulatory Visit: Payer: Self-pay | Admitting: Family Medicine

## 2018-07-10 ENCOUNTER — Ambulatory Visit (INDEPENDENT_AMBULATORY_CARE_PROVIDER_SITE_OTHER): Payer: Medicare Other | Admitting: Orthopaedic Surgery

## 2018-07-10 ENCOUNTER — Encounter (INDEPENDENT_AMBULATORY_CARE_PROVIDER_SITE_OTHER): Payer: Self-pay | Admitting: Orthopaedic Surgery

## 2018-07-10 VITALS — BP 163/94 | HR 65 | Ht 63.5 in | Wt 197.0 lb

## 2018-07-10 DIAGNOSIS — M1612 Unilateral primary osteoarthritis, left hip: Secondary | ICD-10-CM | POA: Diagnosis not present

## 2018-07-10 DIAGNOSIS — R7303 Prediabetes: Secondary | ICD-10-CM

## 2018-07-10 MED FILL — metFORMIN HCL 500 MG TABS: 500 | 90 days supply | Qty: 90 | Fill #0

## 2018-07-10 NOTE — Progress Notes (Signed)
Office Visit Note   Patient: Barbara Adkins           Date of Birth: [birth year]           MRN: [id number] Visit Date: 07/10/2018              Requested by: Forrest Moron, MD [place], [place] [address] PCP: Forrest Moron, MD   Assessment & Plan: Visit Diagnoses:  1. Unilateral primary osteoarthritis, left hip     Plan: End-stage osteoarthritis left hip.  Long discussion with Barbara Adkins regarding her hip pain.  She would like to proceed with a hip replacement.  I have discussed the surgery, the approach, hospitalization what she can expect postoperatively.  She will need clearance from her primary care physician.  Office visit approximately 30 minutes 50% of the time in counseling  Follow-Up Instructions: Return will schedule left THR.   Orders:  No orders of the defined types were placed in this encounter.  No orders of the defined types were placed in this encounter.     Procedures: No procedures performed   Clinical Data: No additional findings.   Subjective: Chief Complaint  Patient presents with  . Follow-up    l hip pain for 1 yr but getting worse, no injury, last seen 4/19 referred from pcp  [name] was seen in March for evaluation of left hip pain.  X-rays demonstrated significant osteoarthritis of her left hip.  She has tried over-the-counter medicines and Tylenol and is reached a point where she is having a difficult time with her activities of daily living even sleep because of her left hip pain.  She limps.  She has trouble getting up and down from a sitting position and even getting out of bed in the morning.  Pain is localized in the area of her left groin and the lateral aspect of her hip.  She has not had any numbness or tingling.  HPI  Review of Systems  Constitutional: Negative for fatigue and fever.  HENT: Negative for ear pain.   Eyes: Negative for pain.  Respiratory: Negative for cough and shortness of breath.     Cardiovascular: Positive for leg swelling.  Gastrointestinal: Negative for constipation and diarrhea.  Genitourinary: Negative for difficulty urinating.  Musculoskeletal: Positive for back pain. Negative for neck pain.  Skin: Negative for rash.  Allergic/Immunologic: Negative for food allergies.  Neurological: Positive for weakness. Negative for numbness.  Hematological: Does not bruise/bleed easily.  Psychiatric/Behavioral: Negative for sleep disturbance.     Objective: Vital Signs: BP (!) 163/94 (BP Location: Right Arm, Patient Position: Sitting, Cuff Size: Normal)   Pulse 65   Ht 5' 3.5" (1.613 m)   Wt 197 lb (89.4 kg)   BMI 34.35 kg/m   Physical Exam  Constitutional: She is oriented to person, place, and time. She appears well-developed and well-nourished.  HENT:  Mouth/Throat: Oropharynx is clear and moist.  Eyes: Pupils are equal, round, and reactive to light. EOM are normal.  Pulmonary/Chest: Effort normal.  Neurological: She is alert and oriented to person, place, and time.  Skin: Skin is warm and dry.  Psychiatric: She has a normal mood and affect. Her behavior is normal.    Ortho Exam awake alert and oriented x3.  Comfortable sitting.  Walks with a distinct limp referable to the left hip.  Left lower extremity is slightly externally rotated with weightbearing.  Considerable pain with internal/external rotation of her left hip.  Does  have decreased motion of her left hip compared to the right.  No percussible back pain.  Straight leg raise is negative for back pain.  No pain over the greater trochanter.  No distal edema.  Specialty Comments:  No specialty comments available.  Imaging: No results found.   PMFS History: Patient Active Problem List   Diagnosis Date Noted  . Weight gain 04/17/2018  . Dyslipidemia 04/17/2018  . Prediabetes 04/17/2018  . Bronchospasm 04/17/2018  . Essential hypertension 03/20/2018  . Class 1 obesity due to excess calories with  serious comorbidity and body mass index (BMI) of 34.0 to 34.9 in adult 03/20/2018  . Acute pain of right knee 03/20/2018   Past Medical History:  Diagnosis Date  . Allergy   . Arthritis   . Deviated septum   . Diabetes mellitus without complication ([facility])   . GERD (gastroesophageal reflux disease)   . Hypertension     Family History  Problem Relation Age of Onset  . Asthma Other   . Cancer Other   . Hypertension Other   . Diabetes Other   . Colon cancer Maternal Grandmother   . Breast cancer Neg Hx   . Esophageal cancer Neg Hx   . Rectal cancer Neg Hx   . Stomach cancer Neg Hx     Past Surgical History:  Procedure Laterality Date  . ETHMOIDECTOMY Right 05/17/2018   Procedure: RIGHT ETHMOIDECTOMY WITH TISSUE REMOVAL;  Surgeon: Leta Baptist, MD;  Location: [facility];  Service: ENT;  Laterality: Right;  . FRONTAL SINUS EXPLORATION Right 05/17/2018   Procedure: RIGHT FRONTAL RECESS EXPLORATION;  Surgeon: Leta Baptist, MD;  Location: [facility];  Service: ENT;  Laterality: Right;  . MAXILLARY ANTROSTOMY Right 05/17/2018   Procedure: RIGHT MAXILLARY ANTROSTOMY WITH TISSUE REMOVAL;  Surgeon: Leta Baptist, MD;  Location: [facility];  Service: ENT;  Laterality: Right;  . NASAL SEPTOPLASTY W/ TURBINOPLASTY Bilateral 05/17/2018   Procedure: NASAL SEPTOPLASTY WITH TURBINATE REDUCTION;  Surgeon: Leta Baptist, MD;  Location: [facility];  Service: ENT;  Laterality: Bilateral;  . SINUS ENDO WITH FUSION N/A 05/17/2018   Procedure: SINUS ENDO WITH FUSION;  Surgeon: Leta Baptist, MD;  Location: [facility];  Service: ENT;  Laterality: N/A;  . SPHENOIDECTOMY Right 05/17/2018   Procedure: RIGHT SPHENOIDECTOMY WITH TISSUE REMOVAL;  Surgeon: Leta Baptist, MD;  Location: [facility];  Service: ENT;  Laterality: Right;  . TUBAL LIGATION     Social History   Occupational History  . Not on file  Tobacco Use  . Smoking status: Never Smoker  .  Smokeless tobacco: Never Used  Substance and Sexual Activity  . Alcohol use: No  . Drug use: Never  . Sexual activity: Not on file

## 2018-07-11 ENCOUNTER — Encounter: Payer: Self-pay | Admitting: Gastroenterology

## 2018-07-21 MED FILL — LOSARTAN POTASSIUM 50 MG TA: 50 | 30 days supply | Qty: 45 | Fill #1

## 2018-07-25 ENCOUNTER — Ambulatory Visit: Payer: Medicare Other | Admitting: Registered"

## 2018-08-01 MED FILL — ATORVASTATIN CALCIUM 20 MG: 20 | 90 days supply | Qty: 90 | Fill #3

## 2018-08-12 ENCOUNTER — Ambulatory Visit (INDEPENDENT_AMBULATORY_CARE_PROVIDER_SITE_OTHER): Payer: Medicare Other | Admitting: Family Medicine

## 2018-08-12 ENCOUNTER — Other Ambulatory Visit: Payer: Self-pay

## 2018-08-12 ENCOUNTER — Telehealth: Payer: Self-pay

## 2018-08-12 ENCOUNTER — Encounter: Payer: Self-pay | Admitting: Family Medicine

## 2018-08-12 VITALS — BP 148/92 | HR 74 | Temp 98.1°F | Resp 17 | Ht 63.5 in | Wt 194.0 lb

## 2018-08-12 DIAGNOSIS — Z23 Encounter for immunization: Secondary | ICD-10-CM

## 2018-08-12 DIAGNOSIS — I10 Essential (primary) hypertension: Secondary | ICD-10-CM | POA: Diagnosis not present

## 2018-08-12 DIAGNOSIS — K30 Functional dyspepsia: Secondary | ICD-10-CM

## 2018-08-12 DIAGNOSIS — Z01818 Encounter for other preprocedural examination: Secondary | ICD-10-CM | POA: Diagnosis not present

## 2018-08-12 DIAGNOSIS — E785 Hyperlipidemia, unspecified: Secondary | ICD-10-CM

## 2018-08-12 LAB — POCT URINALYSIS DIP (MANUAL ENTRY)
BILIRUBIN UA: NEGATIVE
BILIRUBIN UA: NEGATIVE mg/dL
Blood, UA: NEGATIVE
Glucose, UA: NEGATIVE mg/dL
Nitrite, UA: NEGATIVE
PROTEIN UA: NEGATIVE mg/dL
Spec Grav, UA: 1.02 (ref 1.010–1.025)
Urobilinogen, UA: 1 E.U./dL
pH, UA: 8.5 — AB (ref 5.0–8.0)

## 2018-08-12 MED ORDER — OMEPRAZOLE MAGNESIUM 20 MG PO TBEC: 20.0000 mg | DELAYED_RELEASE_TABLET | Freq: Every day | ORAL | 0 refills | Status: DC

## 2018-08-12 NOTE — Telephone Encounter (Signed)
Pre-op clearance form received and completed for pt at visit today.  Form will be scanned in Epic. Dgaddy, CMA

## 2018-08-12 NOTE — Patient Instructions (Addendum)
If you have lab work done today you will be contacted with your lab results within the next 2 weeks.  If you have not heard from [place] then please contact us. The fastest way to get your results is to register for My Chart.   IF you received an x-ray today, you will receive an invoice from [facility]. Please contact [facility] at [phone] with questions or concerns regarding your invoice.   IF you received labwork today, you will receive an invoice from [facility]. Please contact [facility] at [phone] with questions or concerns regarding your invoice.   Our billing staff will not be able to assist you with questions regarding bills from these companies.  You will be contacted with the lab results as soon as they are available. The fastest way to get your results is to activate your My Chart account. Instructions are located on the last page of this paperwork. If you have not heard from [place] regarding the results in 2 weeks, please contact this office.     Preparing for Hip Replacement Recovery from hip replacement surgery can be made easier and more comfortable by being prepared before surgery. This includes:  Arranging for others to help you.  Preparing your home.  Preparing your body by having a preoperative exam and being as healthy as you can.  Doing exercises before your surgery as directed by your health care provider.  You can ease any concerns about your financial responsibilities by calling your insurance company after you decide to have surgery. In addition to asking about your surgery and hospital stay, you will want to ask about coverage for medical equipment, rehabilitation facilities, and home care. How should I arrange for help? You will be stronger and more mobile every day. However, in the first couple weeks after surgery, it is unlikely you will be able to do all your daily activities as easily as before your surgery. You may tire easily and  will still have limited movement in your leg. Follow these guidelines to best arrange for the help you may need after your surgery:  Plan to have someone take you home after the procedure. Your health care provider will be able to tell you how many days you can expect to be in the hospital.  Cancel all work, caregiving, and volunteer responsibilities for at least 4-6 weeks after surgery.  If you live alone, arrange for someone to care for your home and pets for the first 4-6 weeks after surgery.  Select someone with whom you feel comfortable to be with you day and night for the first week. This person will help you with your exercises and personal care, such as bathing and using the toilet.  Arrange for drivers to bring you to and from your follow-up appointments, the grocery store, and other places you may need to go for at least 4-6 weeks.  How should I prepare my home?  Pick a recovery spot, but do not plan on recovering in bed. Sitting in a more upright position is better for your health. You may want to use a recliner with a small table nearby. Choose a chair with a firm seat that will not allow you to sink down into it. Chairs and sofas that are too soft can allow your hip to bend at an angle greater than 90 degrees. This could put you at risk for dislocating your new hip joint. Place the items you use most frequently on the small table.  These may include the TV remote, a cordless phone, a book or laptop computer, a water glass, and any other items of your choice.  Remove all clutter from your floors. Also remove any throw rugs.  To see if you will be able to move in your home with a wheeled walker, hold your hands out about 6 inches (15 cm) from your sides. Walk from your recovery spot to your kitchen and bathroom. Then walk from your bed to the bathroom. If you do not hit anything with your hands, you have enough room.  Move the items you use most often in your kitchen, bathroom, and  bedroom to shelves and drawers that are at countertop height.  Prepare a few meals to freeze and reheat later.  Consider adding grab bars in the shower and near the toilet.  While you are in the hospital, you will learn about equipment that can be helpful for your recovery. Some of the equipment includes raised toilet seats, tub benches, and shower benches. How should I prepare my body?  Have a preoperative exam. This will ensure that your body is healthy enough to safely have this surgery. Bring a complete list of all your medicines and supplements, including herbs and vitamins. You may need to have additional tests to ensure your safety.  Have elective dental care and routine cleanings before your surgery. Germs from anywhere in your body, including your mouth, can travel to your new joint and infect it. It is important not to have any dental work performed for at least 3 months after your surgery. After surgery, be sure to tell your dentist about your joint replacement.  Maintain a healthy diet. Do not change your diet before surgery unless advised to do so by your health care provider.  Do not use any tobacco products, including cigarettes, chewing tobacco, or electronic cigarettes. If you need help quitting, ask your health care provider. Tobacco and nicotine products can delay healing after your surgery.  The day before your surgery, follow your health care provider's directions for showering, eating, drinking, and taking medicines. These directions are for your safety. What kinds of exercises should I do? Your health care provider may have you do the following exercises before your surgery. Be sure to follow the exercise program only as directed by your health care provider. While completing these exercises, remember to stretch for as long as you can, up to 30 seconds. You should only feel a gentle lengthening or release in the stretched tissue. You should not feel pain. Ankle  Pumps 1. While sitting on a firm surface with your legs straight out in front of you, move your feet at the ankle joints so that your toes are pulling back toward your chest. 2. Reverse the motion, pointing your toes away from you. 3. Repeat 10-20 times. Complete this exercise 1-2 times per day.  Heel Slides 1. Lie on your back with both knees straight. (If this causes back pain, bend one knee, placing your foot flat on the floor. Keep this leg in this position while doing heel slides with the opposite leg.) 2. Slowly slide one heel back toward your buttocks until you feel a gentle stretch in the front of your knee or thigh. 3. Slowly slide your heel back to the starting position. 4. Repeat 10-20 times, then switch heels and do it again. Complete this exercise 1-2 times per day.  Quadriceps Sets 1. Lie on your back with one leg extended and your opposite knee  bent. 2. Gradually tighten the muscles in the front of the thigh of your extended leg. This motion will push the back of the knee down toward the floor. 3. Hold the muscle as tightly as you can without causing pain for 10 seconds. 4. Relax the muscles slowly and completely in between each repetition. 5. Repeat 10-20 times, then switch legs and do it again. Complete this exercise 1-2 times per day.  Short Arc Kicks 1. Lie on your back. Place a rolled towel (4-6 inches [10-15 cm] high) under one knee so that the knee slightly bends. 2. Raise only the lower leg of your slightly bent leg by tightening the muscles in the front of your thigh. Do not allow your thigh to rise. 3. Hold this position for 5 seconds. 4. Repeat 10-20 times, then switch legs and do it again. Complete this exercise 1-2 times per day.  Straight Leg Raises 1. Lie on your back with one leg extended and your opposite knee bent. 2. Tighten the muscles in the front of the thigh of your extended leg. Your thigh may shake slightly. 3. Tighten these muscles even more and  raise your leg 4-6 inches off the floor. Hold for 3-5 seconds. 4. Keeping these muscles tight, lower your leg. 5. Relax the muscles slowly and completely in between each repetition. 6. Repeat 10-20 times, then switch legs and do it again. Complete this exercise 1-2 times per day.  Arm Chair Push-ups 1. Find a firm, non-wheeled chair with solid armrests. 2. Sitting in the chair, extend one leg straight out in front of you. 3. Lift up your body weight, using your arms and opposite leg. 4. Slowly lower your body weight. 5. Repeat 10-20 times, then switch legs and do it again. Complete this exercise 1-2 times per day.  This information is not intended to replace advice given to you by your health care provider. Make sure you discuss any questions you have with your health care provider. Document Released: 02/05/2011 Document Revised: 04/05/2016 Document Reviewed: 01/24/2014 Elsevier Interactive Patient Education  [employer].

## 2018-08-12 NOTE — Progress Notes (Signed)
Chief Complaint  Patient presents with  . preop clearance    HPI   Pre-op clearance Pt will be having hip surgery on the left side She states that she has been having left side hip pain  She has primary osteoarthritis of the left hip  Does not take any blood thinners She has a history of hypertension and prediabetes and dyslipidemia Lab Results  Component Value Date   HGBA1C 6.0 (A) 04/17/2018   BP Readings from Last 3 Encounters:  08/12/18 (!) 148/92  07/10/18 (!) 163/94  07/03/18 107/71    She had bouts of hypotension in the past but her blood pressure has been all over the place but worse after she has been standing or walking.  She had surgery May 17, 2018 and did not have any reaction to anesthesia.   Past Medical History:  Diagnosis Date  . Allergy   . Arthritis   . Deviated septum   . Diabetes mellitus without complication ([place])   . GERD (gastroesophageal reflux disease)   . Hypertension     Current Outpatient Medications  Medication Sig Dispense Refill  . atorvastatin (LIPITOR) 20 MG tablet Take 1 tablet (20 mg total) by mouth daily. 90 tablet 3  . cetirizine (ZYRTEC) 10 MG tablet Take 1 tablet (10 mg total) by mouth daily. 30 tablet 11  . losartan (COZAAR) 50 MG tablet Take 1.5 tablets (75 mg total) by mouth daily. 45 tablet 3  . metFORMIN (GLUCOPHAGE) 500 MG tablet TAKE 1 TABLET (500 MG TOTAL) BY MOUTH DAILY WITH BREAKFAST. 90 tablet 0  . omega-3 acid ethyl esters (LOVAZA) 1 g capsule Take by mouth 2 (two) times daily.    . minoxidil (ROGAINE) 2 % external solution Apply topically 2 (two) times daily. (Patient not taking: Reported on 07/03/2018) 60 mL 0  . omeprazole (PRILOSEC OTC) 20 MG tablet Take 1 tablet (20 mg total) by mouth daily. 90 tablet 0   No current facility-administered medications for this visit.     Allergies: No Known Allergies  Past Surgical History:  Procedure Laterality Date  . ETHMOIDECTOMY Right 05/17/2018   Procedure: RIGHT  ETHMOIDECTOMY WITH TISSUE REMOVAL;  Surgeon: Leta Baptist, MD;  Location: [place];  Service: ENT;  Laterality: Right;  . FRONTAL SINUS EXPLORATION Right 05/17/2018   Procedure: RIGHT FRONTAL RECESS EXPLORATION;  Surgeon: Leta Baptist, MD;  Location: [place];  Service: ENT;  Laterality: Right;  . MAXILLARY ANTROSTOMY Right 05/17/2018   Procedure: RIGHT MAXILLARY ANTROSTOMY WITH TISSUE REMOVAL;  Surgeon: Leta Baptist, MD;  Location: [place];  Service: ENT;  Laterality: Right;  . NASAL SEPTOPLASTY W/ TURBINOPLASTY Bilateral 05/17/2018   Procedure: NASAL SEPTOPLASTY WITH TURBINATE REDUCTION;  Surgeon: Leta Baptist, MD;  Location: [place];  Service: ENT;  Laterality: Bilateral;  . SINUS ENDO WITH FUSION N/A 05/17/2018   Procedure: SINUS ENDO WITH FUSION;  Surgeon: Leta Baptist, MD;  Location: [place];  Service: ENT;  Laterality: N/A;  . SPHENOIDECTOMY Right 05/17/2018   Procedure: RIGHT SPHENOIDECTOMY WITH TISSUE REMOVAL;  Surgeon: Leta Baptist, MD;  Location: [place];  Service: ENT;  Laterality: Right;  . TUBAL LIGATION      Social History   Socioeconomic History  . Marital status: Divorced    Spouse name: Not on file  . Number of children: Not on file  . Years of education: Not on file  . Highest education level: Not on file  Occupational  History  . Not on file  Social Needs  . Financial resource strain: Not on file  . Food insecurity:    Worry: Not on file    Inability: Not on file  . Transportation needs:    Medical: Not on file    Non-medical: Not on file  Tobacco Use  . Smoking status: Never Smoker  . Smokeless tobacco: Never Used  Substance and Sexual Activity  . Alcohol use: No  . Drug use: Never  . Sexual activity: Not on file  Lifestyle  . Physical activity:    Days per week: Not on file    Minutes per session: Not on file  . Stress: Not on file  Relationships  . Social connections:    Talks  on phone: Not on file    Gets together: Not on file    Attends religious service: Not on file    Active member of club or organization: Not on file    Attends meetings of clubs or organizations: Not on file    Relationship status: Not on file  Other Topics Concern  . Not on file  Social History Narrative  . Not on file    Family History  Problem Relation Age of Onset  . Asthma Other   . Cancer Other   . Hypertension Other   . Diabetes Other   . Colon cancer Maternal Grandmother   . Breast cancer Neg Hx   . Esophageal cancer Neg Hx   . Rectal cancer Neg Hx   . Stomach cancer Neg Hx      Review of Systems  Constitutional: Negative for chills, fever and malaise/fatigue.  HENT: Negative for congestion, ear discharge and nosebleeds.   Eyes: Negative for blurred vision and double vision.  Respiratory: Negative for cough, shortness of breath and wheezing.   Cardiovascular: Negative for chest pain, palpitations and orthopnea.  Gastrointestinal: Positive for heartburn. Negative for abdominal pain, constipation, diarrhea, nausea and vomiting.  Genitourinary: Negative for dysuria, frequency and urgency.  Musculoskeletal: Positive for joint pain. Negative for falls.  Skin: Negative for itching and rash.  Neurological: Negative for dizziness, tingling, tremors and headaches.  Psychiatric/Behavioral: Negative for depression. The patient is not nervous/anxious and does not have insomnia.      Objective: Vitals:   08/12/18 0829 08/12/18 0855  BP: (!) 170/98 (!) 148/92  Pulse: 74   Resp: 17   Temp: 98.1 F (36.7 C)   TempSrc: Oral   SpO2: 95%   Weight: 194 lb (88 kg)   Height: 5' 3.5" (1.613 m)     Physical Exam  Constitutional: She is oriented to person, place, and time. She appears well-developed and well-nourished.  HENT:  Head: Normocephalic and atraumatic.  Nose: Nose normal.  Eyes: Conjunctivae and EOM are normal.  Neck: Normal range of motion. Neck supple.    Cardiovascular: Normal rate, regular rhythm and normal heart sounds.  No murmur heard. Pulmonary/Chest: Effort normal and breath sounds normal. No stridor. No respiratory distress.  Abdominal: Soft. Bowel sounds are normal. She exhibits no distension. There is no tenderness. There is no guarding.  Musculoskeletal:  OA changes of the knee bilaterally  Neurological: She is alert and oriented to person, place, and time.  Skin: Skin is warm. Capillary refill takes less than 2 seconds.  Psychiatric: She has a normal mood and affect. Her behavior is normal. Judgment and thought content normal.    EKG independently reviewed NSR, no twi, no st elevation Unchanged from EKG  in 05/2018  Assessment and Plan Loza was seen today for preop clearance.  Diagnoses and all orders for this visit:  Preoperative clearance- medically cleared -     EKG 12-Lead -     POCT urinalysis dipstick -     Comprehensive metabolic panel -     CBC  Indigestion- noted on ROS Advised pepcid and if that does not work then take omeprazole -     omeprazole (PRILOSEC OTC) 20 MG tablet; Take 1 tablet (20 mg total) by mouth daily.  Essential hypertension- Patient's blood pressure is improved on recheck. Condition is stable. Continue current medications and treatment plan. I recommend that you exercise for 30-45 minutes 5 days a week. I also recommend a balanced diet with fruits and vegetables every day, lean meats, and little fried foods. The DASH diet (you can find this online) is a good example of this.   Dyslipidemia- stable  Other orders -     Flu vaccine HIGH DOSE PF (Fluzone High dose)     Barbara Adkins

## 2018-08-13 LAB — COMPREHENSIVE METABOLIC PANEL
ALBUMIN: 4.4 g/dL (ref 3.6–4.8)
ALT: 10 IU/L (ref 0–32)
AST: 19 IU/L (ref 0–40)
Albumin/Globulin Ratio: 1.4 (ref 1.2–2.2)
Alkaline Phosphatase: 84 IU/L (ref 39–117)
BUN/Creatinine Ratio: 11 — ABNORMAL LOW (ref 12–28)
BUN: 10 mg/dL (ref 8–27)
Bilirubin Total: 0.6 mg/dL (ref 0.0–1.2)
CO2: 27 mmol/L (ref 20–29)
CREATININE: 0.95 mg/dL (ref 0.57–1.00)
Calcium: 9.8 mg/dL (ref 8.7–10.3)
Chloride: 105 mmol/L (ref 96–106)
GFR, EST AFRICAN AMERICAN: 72 mL/min/{1.73_m2} (ref >59)
GFR, EST NON AFRICAN AMERICAN: 62 mL/min/{1.73_m2} (ref >59)
GLOBULIN, TOTAL: 3.1 g/dL (ref 1.5–4.5)
GLUCOSE: 87 mg/dL (ref 65–99)
Potassium: 4.4 mmol/L (ref 3.5–5.2)
Sodium: 146 mmol/L — ABNORMAL HIGH (ref 134–144)
Total Protein: 7.5 g/dL (ref 6.0–8.5)

## 2018-08-13 LAB — CBC
HEMATOCRIT: 41 % (ref 34.0–46.6)
HEMOGLOBIN: 11.6 g/dL (ref 11.1–15.9)
MCH: 20.2 pg — AB (ref 26.6–33.0)
MCHC: 28.3 g/dL — AB (ref 31.5–35.7)
MCV: 71 fL — AB (ref 79–97)
Platelets: 457 10*3/uL — ABNORMAL HIGH (ref 150–450)
RBC: 5.74 x10E6/uL — AB (ref 3.77–5.28)
RDW: 13.6 % (ref 12.3–15.4)
WBC: 8 10*3/uL (ref 3.4–10.8)

## 2018-08-14 MED FILL — OMEPRAZOLE 20 MG CPDR: 20 | 90 days supply | Qty: 90 | Fill #0

## 2018-08-21 ENCOUNTER — Ambulatory Visit: Payer: Medicare Other | Admitting: Family Medicine

## 2018-08-22 ENCOUNTER — Telehealth (INDEPENDENT_AMBULATORY_CARE_PROVIDER_SITE_OTHER): Payer: Self-pay | Admitting: Orthopaedic Surgery

## 2018-08-22 NOTE — Telephone Encounter (Signed)
Patient called checking to see if Dr. Durward Fortes received the pre-op clearance form from Dr. Nolon Rod.  Patient states she had appointment with Dr. Nolon Rod on 08/12/18 and was told the letter would be sent to Dr. Durward Fortes.

## 2018-08-22 NOTE — Telephone Encounter (Signed)
Can you please check on this? Thank you.

## 2018-08-28 ENCOUNTER — Other Ambulatory Visit: Payer: Self-pay

## 2018-08-28 ENCOUNTER — Telehealth: Payer: Self-pay | Admitting: Family Medicine

## 2018-08-28 ENCOUNTER — Encounter: Payer: Self-pay | Admitting: Family Medicine

## 2018-08-28 ENCOUNTER — Ambulatory Visit (INDEPENDENT_AMBULATORY_CARE_PROVIDER_SITE_OTHER): Payer: Medicare Other | Admitting: Family Medicine

## 2018-08-28 VITALS — BP 162/98 | HR 70 | Temp 98.0°F | Resp 16 | Ht 62.6 in | Wt 192.0 lb

## 2018-08-28 DIAGNOSIS — Z23 Encounter for immunization: Secondary | ICD-10-CM | POA: Diagnosis not present

## 2018-08-28 DIAGNOSIS — I10 Essential (primary) hypertension: Secondary | ICD-10-CM

## 2018-08-28 MED ORDER — TETANUS-DIPHTHERIA TOXOIDS TD 5-2 LFU IM INJ: 0.5000 mL | INJECTION | Freq: Once | INTRAMUSCULAR | 0 refills | Status: AC

## 2018-08-28 MED ORDER — LOSARTAN POTASSIUM 50 MG PO TABS: 75.0000 mg | ORAL_TABLET | Freq: Every day | ORAL | 3 refills | Status: DC

## 2018-08-28 MED FILL — LOSARTAN POTASSIUM 50 MG TA: 50 | 90 days supply | Qty: 135 | Fill #0

## 2018-08-28 NOTE — Telephone Encounter (Unsigned)
Copied from [place] [id number]. Topic: General - Other >> Aug 28, 2018 11:02 AM Jodie Echevaria wrote: Reason for CRM: [facility] called to say that  they do not carry the tetanus & diphtheria toxoids, adult, (TENIVAC) 5-2 LFU injection 0.5 mL but that they do have the boostrix. Want to know if its ok to change and administer that instead. Ph# [phone]

## 2018-08-28 NOTE — Patient Instructions (Addendum)
Next pneumonia vaccine (prenvar) due in 2020   If you have lab work done today you will be contacted with your lab results within the next 2 weeks.  If you have not heard from [place] then please contact us. The fastest way to get your results is to register for My Chart.   IF you received an x-ray today, you will receive an invoice from [facility]. Please contact [facility] at [phone] with questions or concerns regarding your invoice.   IF you received labwork today, you will receive an invoice from [place] [facility]. Please contact [facility] at [phone] with questions or concerns regarding your invoice.   Our billing staff will not be able to assist you with questions regarding bills from these companies.  You will be contacted with the lab results as soon as they are available. The fastest way to get your results is to activate your My Chart account. Instructions are located on the last page of this paperwork. If you have not heard from [place] regarding the results in 2 weeks, please contact this office.     Td Vaccine (Tetanus and Diphtheria): What You Need to Know 1. Why get vaccinated? Tetanus  and diphtheria are very serious diseases. They are rare in the [place] today, but people who do become infected often have severe complications. Td vaccine is used to protect adolescents and adults from both of these diseases. Both tetanus and diphtheria are infections caused by bacteria. Diphtheria spreads from person to person through coughing or sneezing. Tetanus-causing bacteria enter the body through cuts, scratches, or wounds. TETANUS (lockjaw) causes painful muscle tightening and stiffness, usually all over the body.  It can lead to tightening of muscles in the head and neck so you can't open your mouth, swallow, or sometimes even breathe. Tetanus kills about 1 out of every 10 people who are infected even after receiving the best medical care.  DIPHTHERIA can cause a  thick coating to form in the back of the throat.  It can lead to breathing problems, paralysis, heart failure, and death.  Before vaccines, as many as 200,000 cases of diphtheria and hundreds of cases of tetanus were reported in the [place] each year. Since vaccination began, reports of cases for both diseases have dropped by about 99%. 2. Td vaccine Td vaccine can protect adolescents and adults from tetanus and diphtheria. Td is usually given as a booster dose every 10 years but it can also be given earlier after a severe and dirty wound or burn. Another vaccine, called Tdap, which protects against pertussis in addition to tetanus and diphtheria, is sometimes recommended instead of Td vaccine. Your doctor or the person giving you the vaccine can give you more information. Td may safely be given at the same time as other vaccines. 3. Some people should not get this vaccine  A person who has ever had a life-threatening allergic reaction after a previous dose of any tetanus or diphtheria containing vaccine, OR has a severe allergy to any part of this vaccine, should not get Td vaccine. Tell the person giving the vaccine about any severe allergies.  Talk to your doctor if you: ? had severe pain or swelling after any vaccine containing diphtheria or tetanus, ? ever had a condition called Guillain Barre Syndrome (GBS), ? aren't feeling well on the day the shot is scheduled. 4. What are the risks from Td vaccine? With any medicine, including vaccines, there is a chance of side effects. These are usually  mild and go away on their own. Serious reactions are also possible but are rare. Most people who get Td vaccine do not have any problems with it. Mild problems following Td vaccine: (Did not interfere with activities)  Pain where the shot was given (about 8 people in 10)  Redness or swelling where the shot was given (about 1 person in 4)  Mild fever (rare)  Headache (about 1 person in  4)  Tiredness (about 1 person in 4)  Moderate problems following Td vaccine: (Interfered with activities, but did not require medical attention)  Fever over 102F (rare)  Severe problems following Td vaccine: (Unable to perform usual activities; required medical attention)  Swelling, severe pain, bleeding and/or redness in the arm where the shot was given (rare).  Problems that could happen after any vaccine:  People sometimes faint after a medical procedure, including vaccination. Sitting or lying down for about 15 minutes can help prevent fainting, and injuries caused by a fall. Tell your doctor if you feel dizzy, or have vision changes or ringing in the ears.  Some people get severe pain in the shoulder and have difficulty moving the arm where a shot was given. This happens very rarely.  Any medication can cause a severe allergic reaction. Such reactions from a vaccine are very rare, estimated at fewer than 1 in a million doses, and would happen within a few minutes to a few hours after the vaccination. As with any medicine, there is a very remote chance of a vaccine causing a serious injury or death. The safety of vaccines is always being monitored. For more information, visit: http://www.aguilar.org/ 5. What if there is a serious reaction? What should I look for? Look for anything that concerns you, such as signs of a severe allergic reaction, very high fever, or unusual behavior. Signs of a severe allergic reaction can include hives, swelling of the face and throat, difficulty breathing, a fast heartbeat, dizziness, and weakness. These would usually start a few minutes to a few hours after the vaccination. What should I do?  If you think it is a severe allergic reaction or other emergency that can't wait, call 9-1-1 or get the person to the nearest hospital. Otherwise, call your doctor.  Afterward, the reaction should be reported to the Vaccine Adverse Event Reporting System  (VAERS). Your doctor might file this report, or you can do it yourself through the VAERS web site at www.vaers.SamedayNews.es, or by calling 5755095340. ? VAERS does not give medical advice. 6. The National Vaccine Injury Compensation Program The [employer] Vaccine Injury Compensation Program (VICP) is a federal program that was created to compensate people who may have been injured by certain vaccines. Persons who believe they may have been injured by a vaccine can learn about the program and about filing a claim by calling 724-646-7851 or visiting the [place] website at GoldCloset.com.ee. There is a time limit to file a claim for compensation. 7. How can I learn more?  Ask your doctor. He or she can give you the vaccine package insert or suggest other sources of information.  Call your local or state health department.  Contact the Centers for Disease Control and Prevention (CDC): ? Call 308-418-3738 (1-800-CDC-INFO) ? Visit CDC's website at http://hunter.com/ CDC Td Vaccine VIS (02/24/16) This information is not intended to replace advice given to you by your health care provider. Make sure you discuss any questions you have with your health care provider. Document Released: 08/29/2006 Document Revised: 07/22/2016 Document  Reviewed: 07/22/2016 Elsevier Interactive Patient Education  2017 [employer].

## 2018-08-28 NOTE — Progress Notes (Signed)
Chief Complaint  Patient presents with  . Hypertension    3 month follow-up ( pt states she has been out of b/p medication since [place].)     HPI   Hypertension  Patient reports that she has been out of her medication losartan since Thursday  4 days ago  She denies chest pains, sob, palpitations, weakness, blurry vision BP Readings from Last 3 Encounters:  10/23/18 (!) 156/102  08/28/18 (!) 162/98  08/12/18 (!) 148/92   Lab Results  Component Value Date   CREATININE 0.96 10/23/2018     Past Medical History:  Diagnosis Date  . Allergy   . Arthritis   . Deviated septum   . Diabetes mellitus without complication ([place])   . GERD (gastroesophageal reflux disease)   . Hypertension     Current Outpatient Medications  Medication Sig Dispense Refill  . cetirizine (ZYRTEC) 10 MG tablet Take 1 tablet (10 mg total) by mouth daily. 30 tablet 11  . losartan (COZAAR) 50 MG tablet Take 1.5 tablets (75 mg total) by mouth daily. 135 tablet 3  . minoxidil (ROGAINE) 2 % external solution Apply topically 2 (two) times daily. 60 mL 0  . omega-3 acid ethyl esters (LOVAZA) 1 g capsule Take by mouth 2 (two) times daily.    Marland Kitchen atorvastatin (LIPITOR) 20 MG tablet TAKE 1 TABLET (20 MG TOTAL) BY MOUTH DAILY. 30 tablet 0  . metFORMIN (GLUCOPHAGE) 500 MG tablet TAKE 1 TABLET (500 MG TOTAL) BY MOUTH DAILY WITH BREAKFAST. 90 tablet 0  . omeprazole (PRILOSEC) 20 MG capsule TAKE 1 TABLET BY MOUTH DAILY. 90 capsule 1   No current facility-administered medications for this visit.     Allergies: No Known Allergies  Past Surgical History:  Procedure Laterality Date  . ETHMOIDECTOMY Right 05/17/2018   Procedure: RIGHT ETHMOIDECTOMY WITH TISSUE REMOVAL;  Surgeon: Leta Baptist, MD;  Location: [place];  Service: ENT;  Laterality: Right;  . FRONTAL SINUS EXPLORATION Right 05/17/2018   Procedure: RIGHT FRONTAL RECESS EXPLORATION;  Surgeon: Leta Baptist, MD;  Location: [place];  Service:  ENT;  Laterality: Right;  . MAXILLARY ANTROSTOMY Right 05/17/2018   Procedure: RIGHT MAXILLARY ANTROSTOMY WITH TISSUE REMOVAL;  Surgeon: Leta Baptist, MD;  Location: [place];  Service: ENT;  Laterality: Right;  . NASAL SEPTOPLASTY W/ TURBINOPLASTY Bilateral 05/17/2018   Procedure: NASAL SEPTOPLASTY WITH TURBINATE REDUCTION;  Surgeon: Leta Baptist, MD;  Location: [place];  Service: ENT;  Laterality: Bilateral;  . SINUS ENDO WITH FUSION N/A 05/17/2018   Procedure: SINUS ENDO WITH FUSION;  Surgeon: Leta Baptist, MD;  Location: [place];  Service: ENT;  Laterality: N/A;  . SPHENOIDECTOMY Right 05/17/2018   Procedure: RIGHT SPHENOIDECTOMY WITH TISSUE REMOVAL;  Surgeon: Leta Baptist, MD;  Location: [place];  Service: ENT;  Laterality: Right;  . TUBAL LIGATION      Social History   Socioeconomic History  . Marital status: Divorced    Spouse name: Not on file  . Number of children: Not on file  . Years of education: Not on file  . Highest education level: Not on file  Occupational History  . Not on file  Social Needs  . Financial resource strain: Not on file  . Food insecurity:    Worry: Not on file    Inability: Not on file  . Transportation needs:    Medical: Not on file    Non-medical: Not on file  Tobacco  Use  . Smoking status: Never Smoker  . Smokeless tobacco: Never Used  Substance and Sexual Activity  . Alcohol use: No  . Drug use: Never  . Sexual activity: Not on file  Lifestyle  . Physical activity:    Days per week: Not on file    Minutes per session: Not on file  . Stress: Not on file  Relationships  . Social connections:    Talks on phone: Not on file    Gets together: Not on file    Attends religious service: Not on file    Active member of club or organization: Not on file    Attends meetings of clubs or organizations: Not on file    Relationship status: Not on file  Other Topics Concern  . Not on file  Social  History Narrative  . Not on file    Family History  Problem Relation Age of Onset  . Asthma Other   . Cancer Other   . Hypertension Other   . Diabetes Other   . Colon cancer Maternal Grandmother   . Breast cancer Neg Hx   . Esophageal cancer Neg Hx   . Rectal cancer Neg Hx   . Stomach cancer Neg Hx      ROS Review of Systems See HPI Constitution: No fevers or chills No malaise No diaphoresis Skin: No rash or itching Eyes: no blurry vision, no double vision GU: no dysuria or hematuria Neuro: no dizziness or headaches all others reviewed and negative   Objective: Vitals:   08/28/18 0930  BP: (!) 162/98  Pulse: 70  Resp: 16  Temp: 98 F (36.7 C)  TempSrc: Oral  SpO2: 96%  Weight: 192 lb (87.1 kg)  Height: 5' 2.6" (1.59 m)    Physical Exam General: alert, oriented, in NAD Head: normocephalic, atraumatic, no sinus tenderness Eyes: EOM intact, no scleral icterus or conjunctival injection Ears: TM clear bilaterally Nose: mucosa nonerythematous, nonedematous Throat: no pharyngeal exudate or erythema Lymph: no posterior auricular, submental or cervical lymph adenopathy Heart: normal rate, normal sinus rhythm, no murmurs Lungs: clear to auscultation bilaterally, no wheezing   Assessment and Plan Malashia was seen today for hypertension.  Diagnoses and all orders for this visit:  Essential hypertension- increase losartan dose DASH diet advised  -     losartan (COZAAR) 50 MG tablet; Take 1.5 tablets (75 mg total) by mouth daily.  Need for prophylactic vaccination against Streptococcus pneumoniae (pneumococcus) -     Pneumococcal polysaccharide vaccine 23-valent greater than or equal to 2yo subcutaneous/IM  Other orders -     tetanus & diphtheria toxoids, adult, (TENIVAC) 5-2 LFU injection; Inject 0.5 mLs into the muscle once for 1 dose.     House

## 2018-08-28 NOTE — Telephone Encounter (Signed)
Please advise 

## 2018-08-29 NOTE — Telephone Encounter (Signed)
Please notify that it is okay to give the Boostrix.  Thank you.

## 2018-08-30 MED FILL — BOOSTRIX VACCINE SYRINGE: 5-2.5-18.5 | 1 days supply | Qty: 1 | Fill #0

## 2018-08-30 NOTE — Telephone Encounter (Signed)
Pharmacy advised  

## 2018-10-09 ENCOUNTER — Other Ambulatory Visit: Payer: Self-pay | Admitting: Family Medicine

## 2018-10-09 DIAGNOSIS — R7303 Prediabetes: Secondary | ICD-10-CM

## 2018-10-10 MED FILL — metFORMIN HCL 500 MG TABS: 500 | 90 days supply | Qty: 90 | Fill #0

## 2018-10-23 ENCOUNTER — Encounter: Payer: Self-pay | Admitting: Family Medicine

## 2018-10-23 ENCOUNTER — Other Ambulatory Visit: Payer: Self-pay

## 2018-10-23 ENCOUNTER — Ambulatory Visit (INDEPENDENT_AMBULATORY_CARE_PROVIDER_SITE_OTHER): Payer: Medicare Other | Admitting: Family Medicine

## 2018-10-23 VITALS — BP 156/102 | HR 64 | Temp 98.1°F | Resp 17 | Ht 62.6 in | Wt 191.8 lb

## 2018-10-23 DIAGNOSIS — E2839 Other primary ovarian failure: Secondary | ICD-10-CM

## 2018-10-23 DIAGNOSIS — Z1159 Encounter for screening for other viral diseases: Secondary | ICD-10-CM

## 2018-10-23 DIAGNOSIS — D649 Anemia, unspecified: Secondary | ICD-10-CM

## 2018-10-23 DIAGNOSIS — Z0000 Encounter for general adult medical examination without abnormal findings: Secondary | ICD-10-CM | POA: Diagnosis not present

## 2018-10-23 DIAGNOSIS — E785 Hyperlipidemia, unspecified: Secondary | ICD-10-CM | POA: Diagnosis not present

## 2018-10-23 NOTE — Patient Instructions (Signed)
° ° ° °  If you have lab work done today you will be contacted with your lab results within the next 2 weeks.  If you have not heard from us then please contact us. The fastest way to get your results is to register for My Chart. ° ° °IF you received an x-ray today, you will receive an invoice from [facility]. Please contact [facility] at [phone] with questions or concerns regarding your invoice.  ° °IF you received labwork today, you will receive an invoice from [facility]. Please contact [facility] at [phone] with questions or concerns regarding your invoice.  ° °Our billing staff will not be able to assist you with questions regarding bills from these companies. ° °You will be contacted with the lab results as soon as they are available. The fastest way to get your results is to activate your My Chart account. Instructions are located on the last page of this paperwork. If you have not heard from us regarding the results in 2 weeks, please contact this office. °  ° ° ° °

## 2018-10-23 NOTE — Progress Notes (Signed)
QUICK REFERENCE INFORMATION: The ABCs of Providing the Annual Wellness Visit  CMS.gov Medicare Learning Network  [employer] Annual Wellness Visit  Subjective:   Barbara Adkins is a 67 y.o. Female who presents for an Annual Wellness Visit.  Patient Active Problem List   Diagnosis Date Noted  . Weight gain 04/17/2018  . Dyslipidemia 04/17/2018  . Prediabetes 04/17/2018  . Bronchospasm 04/17/2018  . Essential hypertension 03/20/2018  . Class 1 obesity due to excess calories with serious comorbidity and body mass index (BMI) of 34.0 to 34.9 in adult 03/20/2018  . Acute pain of right knee 03/20/2018    Past Medical History:  Diagnosis Date  . Allergy   . Arthritis   . Deviated septum   . Diabetes mellitus without complication ([place])   . GERD (gastroesophageal reflux disease)   . Hypertension      Past Surgical History:  Procedure Laterality Date  . ETHMOIDECTOMY Right 05/17/2018   Procedure: RIGHT ETHMOIDECTOMY WITH TISSUE REMOVAL;  Surgeon: Leta Baptist, MD;  Location: [place];  Service: ENT;  Laterality: Right;  . FRONTAL SINUS EXPLORATION Right 05/17/2018   Procedure: RIGHT FRONTAL RECESS EXPLORATION;  Surgeon: Leta Baptist, MD;  Location: [place];  Service: ENT;  Laterality: Right;  . MAXILLARY ANTROSTOMY Right 05/17/2018   Procedure: RIGHT MAXILLARY ANTROSTOMY WITH TISSUE REMOVAL;  Surgeon: Leta Baptist, MD;  Location: [place];  Service: ENT;  Laterality: Right;  . NASAL SEPTOPLASTY W/ TURBINOPLASTY Bilateral 05/17/2018   Procedure: NASAL SEPTOPLASTY WITH TURBINATE REDUCTION;  Surgeon: Leta Baptist, MD;  Location: [place];  Service: ENT;  Laterality: Bilateral;  . SINUS ENDO WITH FUSION N/A 05/17/2018   Procedure: SINUS ENDO WITH FUSION;  Surgeon: Leta Baptist, MD;  Location: [place];  Service: ENT;  Laterality: N/A;  . SPHENOIDECTOMY Right 05/17/2018   Procedure: RIGHT SPHENOIDECTOMY WITH TISSUE REMOVAL;   Surgeon: Leta Baptist, MD;  Location: [place];  Service: ENT;  Laterality: Right;  . TUBAL LIGATION       Outpatient Medications Prior to Visit  Medication Sig Dispense Refill  . atorvastatin (LIPITOR) 20 MG tablet Take 1 tablet (20 mg total) by mouth daily. 90 tablet 3  . cetirizine (ZYRTEC) 10 MG tablet Take 1 tablet (10 mg total) by mouth daily. 30 tablet 11  . losartan (COZAAR) 50 MG tablet Take 1.5 tablets (75 mg total) by mouth daily. 135 tablet 3  . metFORMIN (GLUCOPHAGE) 500 MG tablet TAKE 1 TABLET (500 MG TOTAL) BY MOUTH DAILY WITH BREAKFAST. 90 tablet 0  . minoxidil (ROGAINE) 2 % external solution Apply topically 2 (two) times daily. 60 mL 0  . omega-3 acid ethyl esters (LOVAZA) 1 g capsule Take by mouth 2 (two) times daily.    Marland Kitchen omeprazole (PRILOSEC OTC) 20 MG tablet Take 1 tablet (20 mg total) by mouth daily. 90 tablet 0   No facility-administered medications prior to visit.     No Known Allergies   Family History  Problem Relation Age of Onset  . Asthma Other   . Cancer Other   . Hypertension Other   . Diabetes Other   . Colon cancer Maternal Grandmother   . Breast cancer Neg Hx   . Esophageal cancer Neg Hx   . Rectal cancer Neg Hx   . Stomach cancer Neg Hx      Social History   Socioeconomic History  . Marital status: Divorced    Spouse name: Not on  file  . Number of children: Not on file  . Years of education: Not on file  . Highest education level: Not on file  Occupational History  . Not on file  Social Needs  . Financial resource strain: Not on file  . Food insecurity:    Worry: Not on file    Inability: Not on file  . Transportation needs:    Medical: Not on file    Non-medical: Not on file  Tobacco Use  . Smoking status: Never Smoker  . Smokeless tobacco: Never Used  Substance and Sexual Activity  . Alcohol use: No  . Drug use: Never  . Sexual activity: Not on file  Lifestyle  . Physical activity:    Days per week: Not on  file    Minutes per session: Not on file  . Stress: Not on file  Relationships  . Social connections:    Talks on phone: Not on file    Gets together: Not on file    Attends religious service: Not on file    Active member of club or organization: Not on file    Attends meetings of clubs or organizations: Not on file    Relationship status: Not on file  Other Topics Concern  . Not on file  Social History Narrative  . Not on file      Recent Hospitalizations? no  Health Habits: Current exercise activities include: walking Exercise: 5 -  7 times/week. Diet: in general, a "healthy" diet    Alcohol intake: none  Health Risk Assessment: The patient has completed a Health Risk Assessment. This has been reveiwed with them and has been scanned into the [facility] system as an attached document.  Current Medical Providers and Suppliers: [facility] Patient Care Team: Forrest Moron, MD as PCP - General (Internal Medicine) Garald Balding, MD as Consulting Physician (Orthopedic Surgery) Future Appointments  Date Time Provider [place]  04/23/2019  8:20 AM Forrest Moron, MD PCP-PCP [facility]     Age-appropriate Screening Schedule: The list below includes current immunization status and future screening recommendations based on patient's age. Orders for these recommended tests are listed in the plan section. The patient has been provided with a written plan. Immunization History  Administered Date(s) Administered  . Influenza, High Dose Seasonal PF 08/12/2018  . Influenza,inj,Quad PF,6+ Mos 11/01/2017  . Pneumococcal Polysaccharide-23 08/28/2018  . Tdap 08/30/2018, 08/30/2018    Health Maintenance reviewed -  Reviewed mammogram, immunizations Ordered Bone Density   Depression Screen-PHQ2/9 completed today  Depression screen [facility] 2/9 10/23/2018 08/28/2018 08/12/2018 06/21/2018 05/22/2018  Decreased Interest 0 0 0 0 0  Down, Depressed, Hopeless 0 0 0 0 0  PHQ - 2 Score 0 0 0 0 0        Depression Severity and Treatment Recommendations:  0-4= None  5-9= Mild / Treatment: Support, educate to call if worse; return in one month  10-14= Moderate / Treatment: Support, watchful waiting; Antidepressant or Psycotherapy  15-19= Moderately severe / Treatment: Antidepressant OR Psychotherapy  >= 20 = Major depression, severe / Antidepressant AND Psychotherapy  Functional Status Survey:   Is the patient deaf or have difficulty hearing?: No Does the patient have difficulty seeing, even when wearing glasses/contacts?: No Does the patient have difficulty concentrating, remembering, or making decisions?: No Does the patient have difficulty walking or climbing stairs?: Yes(a little difficult with the left hip pain) Does the patient have difficulty dressing or bathing?: No Does the patient have difficulty  doing errands alone such as visiting a doctor's office or shopping?: No  Advanced Care Planning: 1. Patient has executed an Advance Directive: No 2. If no, patient was given the opportunity to execute an Advance Directive today? Yes 3. Are the patient's advanced directives in [place]? No 4. This patient has the ability to prepare an Advance Directive: No 5. Provider is willing to follow the patient's wishes: No  Cognitive Assessment: Does the patient have evidence of cognitive impairment? No The patient does not have any evidence of any cognitive problems and denies any  change in mood/affect, appearance, speech, memory or motor skills.  Identification of Risk Factors: Risk factors include: hyperlipidemia and hypertension  ROS Review of Systems  Constitutional: Negative for activity change, appetite change, chills and fever.  HENT: Negative for congestion, nosebleeds, trouble swallowing and voice change.   Respiratory: Negative for cough, shortness of breath and wheezing.   Gastrointestinal: Negative for diarrhea, nausea and vomiting.  Genitourinary: Negative for  difficulty urinating, dysuria, flank pain and hematuria.  Musculoskeletal: +chronic hip pain, and knee pain Neurological: Negative for dizziness, speech difficulty, light-headedness and numbness.  All other review of systems negative.   Objective:   Vitals:   10/23/18 0947  BP: (!) 156/102  Pulse: 64  Resp: 17  Temp: 98.1 F (36.7 C)  TempSrc: Oral  SpO2: 98%  Weight: 191 lb 12.8 oz (87 kg)  Height: 5' 2.6" (1.59 m)    Body mass index is 34.41 kg/m.  Physical Exam  Constitutional: Oriented to person, place, and time. Appears well-developed and well-nourished.  HENT:  Head: Normocephalic and atraumatic.  Eyes: Conjunctivae and EOM are normal.  Cardiovascular: Normal rate, regular rhythm, normal heart sounds and intact distal pulses.  No murmur heard. Pulmonary/Chest: Effort normal and breath sounds normal. No stridor. No respiratory distress. Has no wheezes.  Neurological: Is alert and oriented to person, place, and time.  Skin: Skin is warm. Capillary refill takes less than 2 seconds.  Psychiatric: Has a normal mood and affect. Behavior is normal. Judgment and thought content normal.     Assessment/Plan:   Patient Self-Management and Personalized Health Advice The patient has been provided with information about:  attempt to lose weight  During the course of the visit the patient was educated and counseled about appropriate screening and preventive services including:   lab testing as noted in orders section, return annually or prn      Body mass index is 34.41 kg/m. Discussed the patient's BMI with her.   Barbara Adkins was seen today for annual wellness visit and medication refill.  Diagnoses and all orders for this visit:  Encounter for Medicare annual wellness - Women's Health Maintenance Plan Advised monthly breast exam and annual mammogram Advised dental exam every six months Discussed stress management    Estrogen deficiency -     DG Bone Density;  Future -     CBC  Encounter for hepatitis C screening test for low risk patient -     HCV Ab w/Rflx to Verification  Mild anemia -     CBC  Dyslipidemia -     Lipid panel -     CMP14+EGFR      Return in about 6 months (around 04/24/2019) for hypertension .  Future Appointments  Date Time Provider [place]  04/23/2019  8:20 AM Forrest Moron, MD PCP-PCP [facility]    Patient Instructions       If you have lab work done today you will  be contacted with your lab results within the next 2 weeks.  If you have not heard from [place] then please contact us. The fastest way to get your results is to register for My Chart.   IF you received an x-ray today, you will receive an invoice from [facility]. Please contact [facility] at [phone] with questions or concerns regarding your invoice.   IF you received labwork today, you will receive an invoice from [facility]. Please contact [facility] at [phone] with questions or concerns regarding your invoice.   Our billing staff will not be able to assist you with questions regarding bills from these companies.  You will be contacted with the lab results as soon as they are available. The fastest way to get your results is to activate your My Chart account. Instructions are located on the last page of this paperwork. If you have not heard from [place] regarding the results in 2 weeks, please contact this office.       An after visit summary with all of these plans was given to the patient.

## 2018-10-24 LAB — CBC
Hematocrit: 40.7 % (ref 34.0–46.6)
Hemoglobin: 11.7 g/dL (ref 11.1–15.9)
MCH: 20.1 pg — AB (ref 26.6–33.0)
MCHC: 28.7 g/dL — ABNORMAL LOW (ref 31.5–35.7)
MCV: 70 fL — ABNORMAL LOW (ref 79–97)
PLATELETS: 392 10*3/uL (ref 150–450)
RBC: 5.81 x10E6/uL — ABNORMAL HIGH (ref 3.77–5.28)
RDW: 15.5 % — ABNORMAL HIGH (ref 12.3–15.4)
WBC: 7.5 10*3/uL (ref 3.4–10.8)

## 2018-10-24 LAB — LIPID PANEL
CHOLESTEROL TOTAL: 189 mg/dL (ref 100–199)
Chol/HDL Ratio: 2.5 ratio (ref 0.0–4.4)
HDL: 75 mg/dL (ref >39)
LDL Calculated: 96 mg/dL (ref 0–99)
Triglycerides: 91 mg/dL (ref 0–149)
VLDL Cholesterol Cal: 18 mg/dL (ref 5–40)

## 2018-10-24 LAB — CMP14+EGFR
ALT: 11 IU/L (ref 0–32)
AST: 22 IU/L (ref 0–40)
Albumin/Globulin Ratio: 1.3 (ref 1.2–2.2)
Albumin: 4.2 g/dL (ref 3.6–4.8)
Alkaline Phosphatase: 82 IU/L (ref 39–117)
BUN/Creatinine Ratio: 11 — ABNORMAL LOW (ref 12–28)
BUN: 11 mg/dL (ref 8–27)
Bilirubin Total: 0.6 mg/dL (ref 0.0–1.2)
CALCIUM: 9.6 mg/dL (ref 8.7–10.3)
CO2: 24 mmol/L (ref 20–29)
CREATININE: 0.96 mg/dL (ref 0.57–1.00)
Chloride: 103 mmol/L (ref 96–106)
GFR calc Af Amer: 71 mL/min/{1.73_m2} (ref >59)
GFR, EST NON AFRICAN AMERICAN: 61 mL/min/{1.73_m2} (ref >59)
GLOBULIN, TOTAL: 3.3 g/dL (ref 1.5–4.5)
Glucose: 78 mg/dL (ref 65–99)
Potassium: 4.2 mmol/L (ref 3.5–5.2)
SODIUM: 145 mmol/L — AB (ref 134–144)
Total Protein: 7.5 g/dL (ref 6.0–8.5)

## 2018-10-24 LAB — HCV AB W/RFLX TO VERIFICATION: HCV Ab: <0.1 s/co ratio (ref 0.0–0.9)

## 2018-10-24 LAB — HCV INTERPRETATION

## 2018-10-30 ENCOUNTER — Other Ambulatory Visit: Payer: Self-pay | Admitting: Urgent Care

## 2018-10-30 ENCOUNTER — Other Ambulatory Visit: Payer: Self-pay | Admitting: Family Medicine

## 2018-10-30 DIAGNOSIS — K30 Functional dyspepsia: Secondary | ICD-10-CM

## 2018-10-30 MED FILL — ATORVASTATIN CALCIUM 20 MG: 20 | 30 days supply | Qty: 30 | Fill #0

## 2018-11-10 MED FILL — OMEPRAZOLE 20 MG CPDR: 20 | 90 days supply | Qty: 90 | Fill #0

## 2018-11-13 ENCOUNTER — Ambulatory Visit: Payer: Medicare Other | Admitting: Family Medicine

## 2018-11-13 ENCOUNTER — Ambulatory Visit
Admission: RE | Admit: 2018-11-13 | Discharge: 2018-11-13 | Disposition: A | Discharge status: Home / Self Care | Payer: Medicare Other | Source: Ambulatory Visit | Attending: Family Medicine | Admitting: Family Medicine

## 2018-11-13 DIAGNOSIS — E2839 Other primary ovarian failure: Secondary | ICD-10-CM

## 2018-11-13 DIAGNOSIS — Z780 Asymptomatic menopausal state: Secondary | ICD-10-CM | POA: Diagnosis not present

## 2018-11-13 DIAGNOSIS — M85852 Other specified disorders of bone density and structure, left thigh: Secondary | ICD-10-CM | POA: Diagnosis not present

## 2018-11-16 NOTE — H&P (Addendum)
TOTAL HIP ADMISSION H&P  Patient is admitted for left total hip arthroplasty.  Subjective:  Chief Complaint: left hip pain  HPI: Barbara Adkins, 68 y.o. female, has a history of pain and functional disability in the left hip(s) due to arthritis and patient has failed non-surgical conservative treatments for greater than 12 weeks to include NSAID's and/or analgesics, flexibility and strengthening excercises, weight reduction as appropriate and activity modification.  Onset of symptoms was gradual starting 2 years ago with rapidlly worsening course since that time.The patient noted no past surgery on the left hip(s).  Patient currently rates pain in the left hip at 8 out of 10 with activity. Patient has night pain, worsening of pain with activity and weight bearing, trendelenberg gait, pain that interfers with activities of daily living and pain with passive range of motion. Patient has evidence of subchondral cysts, subchondral sclerosis, periarticular osteophytes and joint space narrowing by imaging studies. This condition presents safety issues increasing the risk of falls.  There is no current active infection.  Patient Active Problem List   Diagnosis Date Noted  . Weight gain 04/17/2018  . Dyslipidemia 04/17/2018  . Prediabetes 04/17/2018  . Bronchospasm 04/17/2018  . Essential hypertension 03/20/2018  . Class 1 obesity due to excess calories with serious comorbidity and body mass index (BMI) of 34.0 to 34.9 in adult 03/20/2018  . Acute pain of right knee 03/20/2018   Past Medical History:  Diagnosis Date  . Allergy   . Arthritis   . Deviated septum   . Diabetes mellitus without complication ([place])   . GERD (gastroesophageal reflux disease)   . Hypertension     Past Surgical History:  Procedure Laterality Date  . ETHMOIDECTOMY Right 05/17/2018   Procedure: RIGHT ETHMOIDECTOMY WITH TISSUE REMOVAL;  Surgeon: Leta Baptist, MD;  Location: [place];  Service: ENT;   Laterality: Right;  . FRONTAL SINUS EXPLORATION Right 05/17/2018   Procedure: RIGHT FRONTAL RECESS EXPLORATION;  Surgeon: Leta Baptist, MD;  Location: [place];  Service: ENT;  Laterality: Right;  . MAXILLARY ANTROSTOMY Right 05/17/2018   Procedure: RIGHT MAXILLARY ANTROSTOMY WITH TISSUE REMOVAL;  Surgeon: Leta Baptist, MD;  Location: [place];  Service: ENT;  Laterality: Right;  . NASAL SEPTOPLASTY W/ TURBINOPLASTY Bilateral 05/17/2018   Procedure: NASAL SEPTOPLASTY WITH TURBINATE REDUCTION;  Surgeon: Leta Baptist, MD;  Location: [place];  Service: ENT;  Laterality: Bilateral;  . SINUS ENDO WITH FUSION N/A 05/17/2018   Procedure: SINUS ENDO WITH FUSION;  Surgeon: Leta Baptist, MD;  Location: [place];  Service: ENT;  Laterality: N/A;  . SPHENOIDECTOMY Right 05/17/2018   Procedure: RIGHT SPHENOIDECTOMY WITH TISSUE REMOVAL;  Surgeon: Leta Baptist, MD;  Location: [place];  Service: ENT;  Laterality: Right;  . TUBAL LIGATION      No current facility-administered medications for this encounter.    Current Outpatient Medications  Medication Sig Dispense Refill Last Dose  . albuterol (PROVENTIL HFA;VENTOLIN HFA) 108 (90 Base) MCG/ACT inhaler Inhale 2 puffs into the lungs every 6 (six) hours as needed for wheezing or shortness of breath.   Taking  . atorvastatin (LIPITOR) 20 MG tablet TAKE 1 TABLET (20 MG TOTAL) BY MOUTH DAILY. 30 tablet 0 Taking  . cetirizine (ZYRTEC) 10 MG tablet Take 1 tablet (10 mg total) by mouth daily. 30 tablet 11 Taking  . losartan (COZAAR) 50 MG tablet Take 1.5 tablets (75 mg total) by mouth daily. 135 tablet 3  Taking  . metFORMIN (GLUCOPHAGE) 500 MG tablet TAKE 1 TABLET (500 MG TOTAL) BY MOUTH DAILY WITH BREAKFAST. 90 tablet 0 Taking  . omeprazole (PRILOSEC) 20 MG capsule TAKE 1 TABLET BY MOUTH DAILY. (Patient taking differently: Take 20 mg by mouth daily. ) 90 capsule 1 Taking  . minoxidil (ROGAINE) 2 % external  solution Apply topically 2 (two) times daily. (Patient not taking: Reported on 11/06/2018) 60 mL 0 Not Taking   No Known Allergies  Social History   Tobacco Use  . Smoking status: Never Smoker  . Smokeless tobacco: Never Used  Substance Use Topics  . Alcohol use: No    Family History  Problem Relation Age of Onset  . Asthma Other   . Cancer Other   . Hypertension Other   . Diabetes Other   . Colon cancer Maternal Grandmother   . Breast cancer Neg Hx   . Esophageal cancer Neg Hx   . Rectal cancer Neg Hx   . Stomach cancer Neg Hx     Review of Systems  Constitutional: Negative for fatigue.  HENT: Negative for trouble swallowing.   Eyes: Negative for pain.  Respiratory: Negative for shortness of breath.   Cardiovascular: Negative for leg swelling.  Gastrointestinal: Negative for constipation.  Endocrine: Negative for cold intolerance.  Genitourinary: Negative for difficulty urinating.  Musculoskeletal: Positive for joint swelling.  Skin: Negative for rash.  Allergic/Immunologic: Negative for food allergies.  Neurological: Negative for weakness.  Hematological: Does not bruise/bleed easily.  Psychiatric/Behavioral: Negative for sleep disturbance.   Objective:  Physical Exam  Constitutional: She is oriented to person, place, and time. She appears well-developed and well-nourished.  HENT:  Head: Normocephalic and atraumatic.  Eyes: Pupils are equal, round, and reactive to light. Conjunctivae and EOM are normal.  Neck: Neck supple.  Cardiovascular: Normal rate, regular rhythm, normal heart sounds and intact distal pulses.  No murmur heard. Respiratory: Effort normal and breath sounds normal. She has no wheezes.  GI: Soft. Bowel sounds are normal. There is no abdominal tenderness.  Neurological: She is alert and oriented to person, place, and time.  Skin: Skin is warm and dry.  Psychiatric: She has a normal mood and affect. Her behavior is normal. Judgment and thought  content normal.    Vital signs in last 24 hours: Temp:  [97 F (36.1 C)] 97 F (36.1 C) (01/07 1219) Pulse Rate:  [67] 67 (01/07 1219) Resp:  [16] 16 (01/07 1219) BP: (154)/(91) 154/91 (01/07 1219) Weight:  [85.8 kg] 85.8 kg (01/07 1219)  Estimated body mass index is 34.61 kg/m as calculated from the following:   Height as of 11/21/18: 5\' 2"  (1.575 m).   Weight as of 11/21/18: 85.8 kg.   Imaging Review Plain radiographs demonstrate severe degenerative joint disease of the left hip(s). The bone quality appears to be good for age and reported activity level.    Preoperative templating of the joint replacement has been completed, documented, and submitted to the Operating Room personnel in order to optimize intra-operative equipment management.     Assessment/Plan:  End stage arthritis, left hip(s)  The patient history, physical examination, clinical judgement of the provider and imaging studies are consistent with end stage degenerative joint disease of the left hip(s) and total hip arthroplasty is deemed medically necessary. The treatment options including medical management, injection therapy, arthroscopy and arthroplasty were discussed at length. The risks and benefits of total hip arthroplasty were presented and reviewed. The risks due to aseptic loosening, infection,  stiffness, dislocation/subluxation,  thromboembolic complications and other imponderables were discussed.  The patient acknowledged the explanation, agreed to proceed with the plan and consent was signed. Patient is being admitted for inpatient treatment for surgery, pain control, PT, OT, prophylactic antibiotics, VTE prophylaxis, progressive ambulation and ADL's and discharge planning.The patient is planning to be discharged home with home health services   Mike Craze. [place], [place] [phone]  11/21/2018 1:32 PM

## 2018-11-17 NOTE — Pre-Procedure Instructions (Signed)
Barbara Adkins  11/17/2018      [place], [place] - [address] [facility]. [address] [facility] [place] [address] Phone: [phone] Fax: [phone]    Your procedure is scheduled on January 14  Report to [place] at [email or web address].  Call this number if you have problems the morning of surgery:  [phone]   Remember:  Do not eat or drink after midnight.      Take these medicines the morning of surgery with A SIP OF WATER  albuterol (PROVENTIL HFA;VENTOLIN HFA) cetirizine (ZYRTEC) omeprazole (PRILOSEC)   7 days prior to surgery STOP taking any Aspirin (unless otherwise instructed by your surgeon), Aleve, Naproxen, Ibuprofen, Motrin, Advil, Goody's, BC's, all herbal medications, fish oil, and all vitamins.   WHAT DO I DO ABOUT MY DIABETES MEDICATION?   Marland Kitchen Do not take oral diabetes medicines (pills) the morning of surgery.  metFORMIN (GLUCOPHAGE)   How to Manage Your Diabetes Before and After Surgery  Why is it important to control my blood sugar before and after surgery? . Improving blood sugar levels before and after surgery helps healing and can limit problems. . A way of improving blood sugar control is eating a healthy diet by: o  Eating less sugar and carbohydrates o  Increasing activity/exercise o  Talking with your doctor about reaching your blood sugar goals . High blood sugars (greater than 180 mg/dL) can raise your risk of infections and slow your recovery, so you will need to focus on controlling your diabetes during the weeks before surgery. . Make sure that the doctor who takes care of your diabetes knows about your planned surgery including the date and location.  How do I manage my blood sugar before surgery? . Check your blood sugar at least 4 times a day, starting 2 days before surgery, to make sure that the level is not too high or low. o Check your blood sugar the morning of your  surgery when you wake up and every 2 hours until you get to the Short Stay unit. . If your blood sugar is less than 70 mg/dL, you will need to treat for low blood sugar: o Do not take insulin. o Treat a low blood sugar (less than 70 mg/dL) with  cup of clear juice (cranberry or apple), 4 glucose tablets, OR glucose gel. o Recheck blood sugar in 15 minutes after treatment (to make sure it is greater than 70 mg/dL). If your blood sugar is not greater than 70 mg/dL on recheck, call [phone] for further instructions. . Report your blood sugar to the short stay nurse when you get to Short Stay.  . If you are admitted to the hospital after surgery: o Your blood sugar will be checked by the staff and you will probably be given insulin after surgery (instead of oral diabetes medicines) to make sure you have good blood sugar levels. o The goal for blood sugar control after surgery is 80-180 mg/dL.    Do not wear jewelry, make-up or nail polish.  Do not wear lotions, powders, or perfumes, or deodorant.  Do not shave 48 hours prior to surgery.   Do not bring valuables to the hospital.  [facility] is not responsible for any belongings or valuables.  Contacts, dentures or bridgework may not be worn into surgery.  Leave your suitcase in the car.  After surgery it may be brought to your room.  For patients  admitted to the hospital, discharge time will be determined by your treatment team.  Patients discharged the day of surgery will not be allowed to drive home.    [facility]- Preparing For Surgery  Before surgery, you can play an important role. Because skin is not sterile, your skin needs to be as free of germs as possible. You can reduce the number of germs on your skin by washing with CHG (chlorahexidine gluconate) Soap before surgery.  CHG is an antiseptic cleaner which kills germs and bonds with the skin to continue killing germs even after washing.    Oral Hygiene is also important to  reduce your risk of infection.  Remember - BRUSH YOUR TEETH THE MORNING OF SURGERY WITH YOUR REGULAR TOOTHPASTE  Please do not use if you have an allergy to CHG or antibacterial soaps. If your skin becomes reddened/irritated stop using the CHG.  Do not shave (including legs and underarms) for at least 48 hours prior to first CHG shower. It is OK to shave your face.  Please follow these instructions carefully.   1. Shower the NIGHT BEFORE SURGERY and the MORNING OF SURGERY with CHG.   2. If you chose to wash your hair, wash your hair first as usual with your normal shampoo.  3. After you shampoo, rinse your hair and body thoroughly to remove the shampoo.  4. Use CHG as you would any other liquid soap. You can apply CHG directly to the skin and wash gently with a scrungie or a clean washcloth.   5. Apply the CHG Soap to your body ONLY FROM THE NECK DOWN.  Do not use on open wounds or open sores. Avoid contact with your eyes, ears, mouth and genitals (private parts). Wash Face and genitals (private parts)  with your normal soap.  6. Wash thoroughly, paying special attention to the area where your surgery will be performed.  7. Thoroughly rinse your body with warm water from the neck down.  8. DO NOT shower/wash with your normal soap after using and rinsing off the CHG Soap.  9. Pat yourself dry with a CLEAN TOWEL.  10. Wear CLEAN PAJAMAS to bed the night before surgery, wear comfortable clothes the morning of surgery  11. Place CLEAN SHEETS on your bed the night of your first shower and DO NOT SLEEP WITH PETS.    Day of Surgery:  Do not apply any deodorants/lotions.  Please wear clean clothes to the hospital/surgery center.   Remember to brush your teeth WITH YOUR REGULAR TOOTHPASTE.    Please read over the following fact sheets that you were given.

## 2018-11-20 ENCOUNTER — Encounter (HOSPITAL_COMMUNITY)
Admission: RE | Admit: 2018-11-20 | Discharge: 2018-11-20 | Disposition: A | Discharge status: Home / Self Care | Payer: Medicare Other | Source: Ambulatory Visit | Attending: Orthopaedic Surgery | Admitting: Orthopaedic Surgery

## 2018-11-20 ENCOUNTER — Other Ambulatory Visit: Payer: Self-pay

## 2018-11-20 ENCOUNTER — Ambulatory Visit (HOSPITAL_COMMUNITY)
Admission: RE | Admit: 2018-11-20 | Discharge: 2018-11-20 | Disposition: A | Discharge status: Home / Self Care | Payer: Medicare Other | Source: Ambulatory Visit | Attending: Orthopaedic Surgery | Admitting: Orthopaedic Surgery

## 2018-11-20 ENCOUNTER — Encounter (HOSPITAL_COMMUNITY): Payer: Self-pay

## 2018-11-20 DIAGNOSIS — Z01818 Encounter for other preprocedural examination: Secondary | ICD-10-CM

## 2018-11-20 LAB — COMPREHENSIVE METABOLIC PANEL
ALBUMIN: 3.8 g/dL (ref 3.5–5.0)
ALT: 12 U/L (ref 0–44)
AST: 23 U/L (ref 15–41)
Alkaline Phosphatase: 66 U/L (ref 38–126)
Anion gap: 9 (ref 5–15)
BUN: 11 mg/dL (ref 8–23)
CHLORIDE: 105 mmol/L (ref 98–111)
CO2: 25 mmol/L (ref 22–32)
Calcium: 9.4 mg/dL (ref 8.9–10.3)
Creatinine, Ser: 0.99 mg/dL (ref 0.44–1.00)
GFR calc Af Amer: >60 mL/min (ref >60)
GFR calc non Af Amer: 59 mL/min — ABNORMAL LOW (ref >60)
Glucose, Bld: 98 mg/dL (ref 70–99)
Potassium: 3.8 mmol/L (ref 3.5–5.1)
Sodium: 139 mmol/L (ref 135–145)
Total Bilirubin: 1 mg/dL (ref 0.3–1.2)
Total Protein: 8 g/dL (ref 6.5–8.1)

## 2018-11-20 LAB — PROTIME-INR
INR: 1.04
Prothrombin Time: 13.5 seconds (ref 11.4–15.2)

## 2018-11-20 LAB — CBC WITH DIFFERENTIAL/PLATELET
Abs Immature Granulocytes: 0.03 10*3/uL (ref 0.00–0.07)
BASOS ABS: 0.1 10*3/uL (ref 0.0–0.1)
Basophils Relative: 1 %
EOS PCT: 5 %
Eosinophils Absolute: 0.3 10*3/uL (ref 0.0–0.5)
HCT: 42.7 % (ref 36.0–46.0)
Hemoglobin: 12.5 g/dL (ref 12.0–15.0)
IMMATURE GRANULOCYTES: 0 %
Lymphocytes Relative: 23 %
Lymphs Abs: 1.6 10*3/uL (ref 0.7–4.0)
MCH: 20.6 pg — ABNORMAL LOW (ref 26.0–34.0)
MCHC: 29.3 g/dL — ABNORMAL LOW (ref 30.0–36.0)
MCV: 70.3 fL — ABNORMAL LOW (ref 80.0–100.0)
MONOS PCT: 7 %
Monocytes Absolute: 0.5 10*3/uL (ref 0.1–1.0)
Neutro Abs: 4.5 10*3/uL (ref 1.7–7.7)
Neutrophils Relative %: 64 %
Platelets: 404 10*3/uL — ABNORMAL HIGH (ref 150–400)
RBC: 6.07 MIL/uL — AB (ref 3.87–5.11)
RDW: 17.1 % — ABNORMAL HIGH (ref 11.5–15.5)
WBC: 7.1 10*3/uL (ref 4.0–10.5)
nRBC: 0 % (ref 0.0–0.2)

## 2018-11-20 LAB — TYPE AND SCREEN
ABO/RH(D): O POS
Antibody Screen: NEGATIVE

## 2018-11-20 LAB — HEMOGLOBIN A1C
Hgb A1c MFr Bld: 5.6 % (ref 4.8–5.6)
Mean Plasma Glucose: 114.02 mg/dL

## 2018-11-20 LAB — URINALYSIS, ROUTINE W REFLEX MICROSCOPIC
BILIRUBIN URINE: NEGATIVE
Bacteria, UA: NONE SEEN
Glucose, UA: NEGATIVE mg/dL
Hgb urine dipstick: NEGATIVE
Ketones, ur: NEGATIVE mg/dL
Nitrite: NEGATIVE
Protein, ur: NEGATIVE mg/dL
Specific Gravity, Urine: 1.018 (ref 1.005–1.030)
pH: 6 (ref 5.0–8.0)

## 2018-11-20 LAB — SURGICAL PCR SCREEN
MRSA, PCR: NEGATIVE
Staphylococcus aureus: NEGATIVE

## 2018-11-20 LAB — APTT: aPTT: 32 seconds (ref 24–36)

## 2018-11-20 LAB — ABO/RH: ABO/RH(D): O POS

## 2018-11-20 NOTE — Progress Notes (Deleted)
Contacted Dr. Jerald Kief office regarding UA- trace of leukocytes. Spoke with Denyse Amass- will forward information to Dr. Rhona Raider and contact patient.

## 2018-11-20 NOTE — Progress Notes (Signed)
PCP - Dr. Delia Chimes Cardiologist - denies  Chest x-ray - 11/20/18 EKG - 08/12/18 Stress Test - denies ECHO - denies Cardiac Cath - denies  Sleep Study - denies   Patient with type 2 diabetes. Does not check CBG at home   Aspirin Instructions: Patient instructed to hold all Aspirin, NSAID's, herbal medications, fish oil and vitamins 7 days prior to surgery.  Anesthesia review: no  Patient denies shortness of breath, fever, cough and chest pain at PAT appointment   Patient verbalized understanding of instructions that were given to them at the PAT appointment. Patient was also instructed that they will need to review over the PAT instructions again at home before surgery.

## 2018-11-21 ENCOUNTER — Ambulatory Visit (INDEPENDENT_AMBULATORY_CARE_PROVIDER_SITE_OTHER): Payer: Medicare Other | Admitting: Orthopedic Surgery

## 2018-11-21 ENCOUNTER — Encounter (INDEPENDENT_AMBULATORY_CARE_PROVIDER_SITE_OTHER): Payer: Self-pay | Admitting: Orthopedic Surgery

## 2018-11-21 DIAGNOSIS — M1612 Unilateral primary osteoarthritis, left hip: Secondary | ICD-10-CM | POA: Diagnosis not present

## 2018-11-21 LAB — URINE CULTURE
Culture: NO GROWTH
Special Requests: NORMAL

## 2018-11-21 NOTE — Progress Notes (Signed)
Office Visit Note   Patient: Barbara Adkins           Date of Birth: 06-27-1951           MRN: [id number] Visit Date: 11/21/2018              Requested by: Forrest Moron, MD [place], [place] [address] PCP: Forrest Moron, MD   Chief Complaint: left hip pain  HPI: Barbara Adkins, 68 y.o. female, has a history of pain and functional disability in the left hip(s) due to arthritis and patient has failed non-surgical conservative treatments for greater than 12 weeks to include NSAID's and/or analgesics, flexibility and strengthening excercises, weight reduction as appropriate and activity modification.  Onset of symptoms was gradual starting 2 years ago with rapidlly worsening course since that time.The patient noted no past surgery on the left hip(s).  Patient currently rates pain in the left hip at 8 out of 10 with activity. Patient has night pain, worsening of pain with activity and weight bearing, trendelenberg gait, pain that interfers with activities of daily living and pain with passive range of motion. Patient has evidence of subchondral cysts, subchondral sclerosis, periarticular osteophytes and joint space narrowing by imaging studies. This condition presents safety issues increasing the risk of falls.  There is no current active infection.  Patient Active Problem List   Diagnosis Date Noted  . Weight gain 04/17/2018  . Dyslipidemia 04/17/2018  . Prediabetes 04/17/2018  . Bronchospasm 04/17/2018  . Essential hypertension 03/20/2018  . Class 1 obesity due to excess calories with serious comorbidity and body mass index (BMI) of 34.0 to 34.9 in adult 03/20/2018  . Acute pain of right knee 03/20/2018   Past Medical History:  Diagnosis Date  . Allergy   . Arthritis   . Deviated septum   . Diabetes mellitus without complication ([place])   . GERD (gastroesophageal reflux disease)   . Hypertension     Past Surgical History:  Procedure Laterality Date  .  ETHMOIDECTOMY Right 05/17/2018   Procedure: RIGHT ETHMOIDECTOMY WITH TISSUE REMOVAL;  Surgeon: Leta Baptist, MD;  Location: [facility];  Service: ENT;  Laterality: Right;  . FRONTAL SINUS EXPLORATION Right 05/17/2018   Procedure: RIGHT FRONTAL RECESS EXPLORATION;  Surgeon: Leta Baptist, MD;  Location: [facility];  Service: ENT;  Laterality: Right;  . MAXILLARY ANTROSTOMY Right 05/17/2018   Procedure: RIGHT MAXILLARY ANTROSTOMY WITH TISSUE REMOVAL;  Surgeon: Leta Baptist, MD;  Location: [facility];  Service: ENT;  Laterality: Right;  . NASAL SEPTOPLASTY W/ TURBINOPLASTY Bilateral 05/17/2018   Procedure: NASAL SEPTOPLASTY WITH TURBINATE REDUCTION;  Surgeon: Leta Baptist, MD;  Location: [facility];  Service: ENT;  Laterality: Bilateral;  . SINUS ENDO WITH FUSION N/A 05/17/2018   Procedure: SINUS ENDO WITH FUSION;  Surgeon: Leta Baptist, MD;  Location: [place] [facility];  Service: ENT;  Laterality: N/A;  . SPHENOIDECTOMY Right 05/17/2018   Procedure: RIGHT SPHENOIDECTOMY WITH TISSUE REMOVAL;  Surgeon: Leta Baptist, MD;  Location: [facility];  Service: ENT;  Laterality: Right;  . TUBAL LIGATION      No current facility-administered medications for this encounter.    Current Outpatient Medications  Medication Sig Dispense Refill Last Dose  . albuterol (PROVENTIL HFA;VENTOLIN HFA) 108 (90 Base) MCG/ACT inhaler Inhale 2 puffs into the lungs every 6 (six) hours as needed for wheezing or shortness of breath.   Taking  . atorvastatin (LIPITOR)  20 MG tablet TAKE 1 TABLET (20 MG TOTAL) BY MOUTH DAILY. 30 tablet 0 Taking  . cetirizine (ZYRTEC) 10 MG tablet Take 1 tablet (10 mg total) by mouth daily. 30 tablet 11 Taking  . losartan (COZAAR) 50 MG tablet Take 1.5 tablets (75 mg total) by mouth daily. 135 tablet 3 Taking  . metFORMIN (GLUCOPHAGE) 500 MG tablet TAKE 1 TABLET (500 MG TOTAL) BY MOUTH DAILY WITH BREAKFAST. 90 tablet 0 Taking  . omeprazole (PRILOSEC)  20 MG capsule TAKE 1 TABLET BY MOUTH DAILY. (Patient taking differently: Take 20 mg by mouth daily. ) 90 capsule 1 Taking  . minoxidil (ROGAINE) 2 % external solution Apply topically 2 (two) times daily. (Patient not taking: Reported on 11/06/2018) 60 mL 0 Not Taking   No Known Allergies  Social History   Tobacco Use  . Smoking status: Never Smoker  . Smokeless tobacco: Never Used  Substance Use Topics  . Alcohol use: No    Family History  Problem Relation Age of Onset  . Asthma Other   . Cancer Other   . Hypertension Other   . Diabetes Other   . Colon cancer Maternal Grandmother   . Breast cancer Neg Hx   . Esophageal cancer Neg Hx   . Rectal cancer Neg Hx   . Stomach cancer Neg Hx     Review of Systems  Constitutional: Negative for fatigue.  HENT: Negative for trouble swallowing.   Eyes: Negative for pain.  Respiratory: Negative for shortness of breath.   Cardiovascular: Negative for leg swelling.  Gastrointestinal: Negative for constipation.  Endocrine: Negative for cold intolerance.  Genitourinary: Negative for difficulty urinating.  Musculoskeletal: Positive for joint swelling.  Skin: Negative for rash.  Allergic/Immunologic: Negative for food allergies.  Neurological: Negative for weakness.  Hematological: Does not bruise/bleed easily.  Psychiatric/Behavioral: Negative for sleep disturbance.   Objective:  Physical Exam  Constitutional: She is oriented to person, place, and time. She appears well-developed and well-nourished.  HENT:  Head: Normocephalic and atraumatic.  Eyes: Pupils are equal, round, and reactive to light. Conjunctivae and EOM are normal.  Neck: Neck supple.  Cardiovascular: Normal rate, regular rhythm, normal heart sounds and intact distal pulses.  No murmur heard. Respiratory: Effort normal and breath sounds normal. She has no wheezes.  GI: Soft. Bowel sounds are normal. There is no abdominal tenderness.  Neurological: She is alert and  oriented to person, place, and time.  Skin: Skin is warm and dry.  Psychiatric: She has a normal mood and affect. Her behavior is normal. Judgment and thought content normal.    Vital signs in last 24 hours: Temp:  [97 F (36.1 C)] 97 F (36.1 C) (01/07 1219) Pulse Rate:  [67] 67 (01/07 1219) Resp:  [16] 16 (01/07 1219) BP: (154)/(91) 154/91 (01/07 1219) Weight:  [85.8 kg] 85.8 kg (01/07 1219)  Estimated body mass index is 34.61 kg/m as calculated from the following:   Height as of 11/21/18: 5\' 2"  (1.575 m).   Weight as of 11/21/18: 85.8 kg.   Imaging Review Plain radiographs demonstrate severe degenerative joint disease of the left hip(s). The bone quality appears to be good for age and reported activity level.    Preoperative templating of the joint replacement has been completed, documented, and submitted to the Operating Room personnel in order to optimize intra-operative equipment management.     Assessment/Plan:  End stage arthritis, left hip(s)  The patient history, physical examination, clinical judgement of the provider and imaging  studies are consistent with end stage degenerative joint disease of the left hip(s) and total hip arthroplasty is deemed medically necessary. The treatment options including medical management, injection therapy, arthroscopy and arthroplasty were discussed at length. The risks and benefits of total hip arthroplasty were presented and reviewed. The risks due to aseptic loosening, infection, stiffness, dislocation/subluxation,  thromboembolic complications and other imponderables were discussed.  The patient acknowledged the explanation, agreed to proceed with the plan and consent was signed. Patient is being admitted for inpatient treatment for surgery, pain control, PT, OT, prophylactic antibiotics, VTE prophylaxis, progressive ambulation and ADL's and discharge planning.The patient is planning to be discharged home with home health services    Face-to-face time spent with patient was greater than 40 minutes.  Greater than 50% of the time was spent in counseling and coordination of care.  Mike Craze [place], [place] [phone]  11/21/2018 1:32 PM

## 2018-11-22 ENCOUNTER — Ambulatory Visit (INDEPENDENT_AMBULATORY_CARE_PROVIDER_SITE_OTHER): Payer: Medicare Other | Admitting: Orthopedic Surgery

## 2018-11-23 MED FILL — LOSARTAN POTASSIUM 50 MG TA: 50 | 90 days supply | Qty: 135 | Fill #1

## 2018-11-27 ENCOUNTER — Encounter (HOSPITAL_COMMUNITY): Payer: Self-pay | Admitting: Anesthesiology

## 2018-11-27 MED ORDER — TRANEXAMIC ACID 1000 MG/10ML IV SOLN
2000.0000 mg | INTRAVENOUS | Status: AC
  Administered 2018-11-28: 2000 mg via TOPICAL
  Filled 2018-11-27: qty 20

## 2018-11-27 MED ORDER — ACETAMINOPHEN 10 MG/ML IV SOLN
1000.0000 mg | Freq: Once | INTRAVENOUS | Status: AC
  Administered 2018-11-28: 1000 mg via INTRAVENOUS
  Filled 2018-11-27: qty 100

## 2018-11-27 NOTE — Anesthesia Preprocedure Evaluation (Addendum)
Anesthesia Evaluation  Patient identified by MRN, date of birth, ID band Patient awake    Reviewed: Allergy & Precautions, NPO status , Patient's Chart, lab work & pertinent test results  Airway Mallampati: II  TM Distance: >3 FB Neck ROM: Full    Dental  (+) Upper Dentures, Missing   Pulmonary neg pulmonary ROS,    Pulmonary exam normal breath sounds clear to auscultation       Cardiovascular hypertension, Pt. on medications Normal cardiovascular exam Rhythm:Regular Rate:Normal     Neuro/Psych negative neurological ROS  negative psych ROS   GI/Hepatic Neg liver ROS, GERD  Medicated and Controlled,  Endo/Other  diabetes, Well Controlled, Type 2, Oral Hypoglycemic AgentsObesity Hyperlipidemia  Renal/GU negative Renal ROS  negative genitourinary   Musculoskeletal  (+) Arthritis , Osteoarthritis,  Left Hip OA   Abdominal (+) + obese,   Peds  Hematology negative hematology ROS (+)   Anesthesia Other Findings   Reproductive/Obstetrics                           Anesthesia Physical Anesthesia Plan  ASA: II  Anesthesia Plan: Spinal   Post-op Pain Management:    Induction: Intravenous  PONV Risk Score and Plan: 3  Airway Management Planned: Natural Airway and Simple Face Mask  Additional Equipment:   Intra-op Plan:   Post-operative Plan:   Informed Consent: I have reviewed the patients History and Physical, chart, labs and discussed the procedure including the risks, benefits and alternatives for the proposed anesthesia with the patient or authorized representative who has indicated his/her understanding and acceptance.   Dental advisory given  Plan Discussed with: CRNA and Surgeon  Anesthesia Plan Comments:        Anesthesia Quick Evaluation

## 2018-11-28 ENCOUNTER — Encounter (HOSPITAL_COMMUNITY): Payer: Self-pay | Admitting: *Deleted

## 2018-11-28 ENCOUNTER — Ambulatory Visit (HOSPITAL_COMMUNITY): Payer: Medicare Other | Admitting: Anesthesiology

## 2018-11-28 ENCOUNTER — Encounter (HOSPITAL_COMMUNITY)
Admission: RE | Disposition: A | Discharge status: Home Health | Payer: Self-pay | Source: Home / Self Care | Attending: Orthopaedic Surgery

## 2018-11-28 ENCOUNTER — Inpatient Hospital Stay (HOSPITAL_COMMUNITY)
Admission: RE | Admit: 2018-11-28 | Discharge: 2018-11-30 | DRG: 470 | Disposition: A | Discharge status: Home Health | Payer: Medicare Other | Attending: Orthopaedic Surgery | Admitting: Orthopaedic Surgery

## 2018-11-28 ENCOUNTER — Other Ambulatory Visit: Payer: Self-pay

## 2018-11-28 ENCOUNTER — Inpatient Hospital Stay (HOSPITAL_COMMUNITY): Payer: Medicare Other

## 2018-11-28 DIAGNOSIS — Z8249 Family history of ischemic heart disease and other diseases of the circulatory system: Secondary | ICD-10-CM

## 2018-11-28 DIAGNOSIS — E785 Hyperlipidemia, unspecified: Secondary | ICD-10-CM | POA: Diagnosis not present

## 2018-11-28 DIAGNOSIS — M1612 Unilateral primary osteoarthritis, left hip: Secondary | ICD-10-CM | POA: Diagnosis not present

## 2018-11-28 DIAGNOSIS — Z833 Family history of diabetes mellitus: Secondary | ICD-10-CM

## 2018-11-28 DIAGNOSIS — D62 Acute posthemorrhagic anemia: Secondary | ICD-10-CM | POA: Diagnosis not present

## 2018-11-28 DIAGNOSIS — I10 Essential (primary) hypertension: Secondary | ICD-10-CM | POA: Diagnosis present

## 2018-11-28 DIAGNOSIS — Z79899 Other long term (current) drug therapy: Secondary | ICD-10-CM | POA: Diagnosis not present

## 2018-11-28 DIAGNOSIS — E119 Type 2 diabetes mellitus without complications: Secondary | ICD-10-CM | POA: Diagnosis not present

## 2018-11-28 DIAGNOSIS — M25552 Pain in left hip: Secondary | ICD-10-CM | POA: Diagnosis not present

## 2018-11-28 DIAGNOSIS — Z6834 Body mass index (BMI) 34.0-34.9, adult: Secondary | ICD-10-CM

## 2018-11-28 DIAGNOSIS — Z7984 Long term (current) use of oral hypoglycemic drugs: Secondary | ICD-10-CM | POA: Diagnosis not present

## 2018-11-28 DIAGNOSIS — Z96642 Presence of left artificial hip joint: Secondary | ICD-10-CM | POA: Diagnosis not present

## 2018-11-28 DIAGNOSIS — Z96649 Presence of unspecified artificial hip joint: Secondary | ICD-10-CM

## 2018-11-28 DIAGNOSIS — Z471 Aftercare following joint replacement surgery: Secondary | ICD-10-CM | POA: Diagnosis not present

## 2018-11-28 DIAGNOSIS — E6609 Other obesity due to excess calories: Secondary | ICD-10-CM | POA: Diagnosis present

## 2018-11-28 HISTORY — PX: TOTAL HIP ARTHROPLASTY: SHX124

## 2018-11-28 LAB — GLUCOSE, CAPILLARY
Glucose-Capillary: 80 mg/dL (ref 70–99)
Glucose-Capillary: 90 mg/dL (ref 70–99)
Glucose-Capillary: 107 mg/dL — ABNORMAL HIGH (ref 70–99)
Glucose-Capillary: 121 mg/dL — ABNORMAL HIGH (ref 70–99)

## 2018-11-28 LAB — HEMOGLOBIN A1C
HEMOGLOBIN A1C: 6 % — AB (ref 4.8–5.6)
Mean Plasma Glucose: 125.5 mg/dL

## 2018-11-28 SURGERY — ARTHROPLASTY, HIP, TOTAL,POSTERIOR APPROACH
Anesthesia: Spinal | Site: Hip | Laterality: Left

## 2018-11-28 MED ORDER — CHLORHEXIDINE GLUCONATE 4 % EX LIQD: 60.0000 mL | Freq: Once | CUTANEOUS | Status: DC

## 2018-11-28 MED ORDER — PROPOFOL 1000 MG/100ML IV EMUL
INTRAVENOUS | Status: AC
  Filled 2018-11-28: qty 300

## 2018-11-28 MED ORDER — SODIUM CHLORIDE 0.9 % IV SOLN
INTRAVENOUS | Status: DC | PRN
  Administered 2018-11-28: 25 ug/min via INTRAVENOUS

## 2018-11-28 MED ORDER — SODIUM CHLORIDE 0.9 % IR SOLN
Status: DC | PRN
  Administered 2018-11-28: 3000 mL

## 2018-11-28 MED ORDER — ATORVASTATIN CALCIUM 10 MG PO TABS
20.0000 mg | ORAL_TABLET | Freq: Every day | ORAL | Status: DC
  Administered 2018-11-28 – 2018-11-30 (×3): 20 mg via ORAL
  Filled 2018-11-28 (×3): qty 2

## 2018-11-28 MED ORDER — KETOROLAC TROMETHAMINE 15 MG/ML IJ SOLN
INTRAMUSCULAR | Status: AC
  Filled 2018-11-28: qty 1

## 2018-11-28 MED ORDER — PHENOL 1.4 % MT LIQD: 1.0000 | OROMUCOSAL | Status: DC | PRN

## 2018-11-28 MED ORDER — METOCLOPRAMIDE HCL 5 MG/ML IJ SOLN: 5.0000 mg | Freq: Three times a day (TID) | INTRAMUSCULAR | Status: DC | PRN

## 2018-11-28 MED ORDER — INSULIN ASPART 100 UNIT/ML ~~LOC~~ SOLN: 0.0000 [IU] | Freq: Three times a day (TID) | SUBCUTANEOUS | Status: DC

## 2018-11-28 MED ORDER — ONDANSETRON HCL 4 MG/2ML IJ SOLN
INTRAMUSCULAR | Status: DC | PRN
  Administered 2018-11-28: 4 mg via INTRAVENOUS

## 2018-11-28 MED ORDER — PHENYLEPHRINE 40 MCG/ML (10ML) SYRINGE FOR IV PUSH (FOR BLOOD PRESSURE SUPPORT)
PREFILLED_SYRINGE | INTRAVENOUS | Status: DC | PRN
  Administered 2018-11-28 (×2): 40 ug via INTRAVENOUS

## 2018-11-28 MED ORDER — METHOCARBAMOL 1000 MG/10ML IJ SOLN
500.0000 mg | Freq: Four times a day (QID) | INTRAVENOUS | Status: DC | PRN
  Filled 2018-11-28: qty 5

## 2018-11-28 MED ORDER — METHOCARBAMOL 500 MG PO TABS
ORAL_TABLET | ORAL | Status: AC
  Filled 2018-11-28: qty 1

## 2018-11-28 MED ORDER — ALBUTEROL SULFATE (2.5 MG/3ML) 0.083% IN NEBU: 2.5000 mg | INHALATION_SOLUTION | Freq: Four times a day (QID) | RESPIRATORY_TRACT | Status: DC | PRN

## 2018-11-28 MED ORDER — DIPHENHYDRAMINE HCL 12.5 MG/5ML PO ELIX: 12.5000 mg | ORAL_SOLUTION | ORAL | Status: DC | PRN

## 2018-11-28 MED ORDER — PANTOPRAZOLE SODIUM 40 MG PO TBEC
40.0000 mg | DELAYED_RELEASE_TABLET | Freq: Every day | ORAL | Status: DC
  Administered 2018-11-29 – 2018-11-30 (×2): 40 mg via ORAL
  Filled 2018-11-28 (×2): qty 1

## 2018-11-28 MED ORDER — METFORMIN HCL 500 MG PO TABS
500.0000 mg | ORAL_TABLET | Freq: Every day | ORAL | Status: DC
  Administered 2018-11-30: 500 mg via ORAL
  Filled 2018-11-28: qty 1

## 2018-11-28 MED ORDER — ONDANSETRON HCL 4 MG/2ML IJ SOLN
INTRAMUSCULAR | Status: AC
  Filled 2018-11-28: qty 2

## 2018-11-28 MED ORDER — ONDANSETRON HCL 4 MG PO TABS: 4.0000 mg | ORAL_TABLET | Freq: Four times a day (QID) | ORAL | Status: DC | PRN

## 2018-11-28 MED ORDER — MEPERIDINE HCL 50 MG/ML IJ SOLN: 6.2500 mg | INTRAMUSCULAR | Status: DC | PRN

## 2018-11-28 MED ORDER — ONDANSETRON HCL 4 MG/2ML IJ SOLN: 4.0000 mg | Freq: Four times a day (QID) | INTRAMUSCULAR | Status: DC | PRN

## 2018-11-28 MED ORDER — CEFAZOLIN SODIUM-DEXTROSE 2-3 GM-%(50ML) IV SOLR: INTRAVENOUS | Status: DC | PRN

## 2018-11-28 MED ORDER — MAGNESIUM HYDROXIDE 400 MG/5ML PO SUSP: 30.0000 mL | Freq: Every day | ORAL | Status: DC | PRN

## 2018-11-28 MED ORDER — OXYCODONE HCL 5 MG PO TABS
5.0000 mg | ORAL_TABLET | ORAL | Status: DC | PRN
  Administered 2018-11-28 – 2018-11-30 (×9): 10 mg via ORAL
  Filled 2018-11-28 (×7): qty 2

## 2018-11-28 MED ORDER — ACETAMINOPHEN 10 MG/ML IV SOLN
1000.0000 mg | Freq: Four times a day (QID) | INTRAVENOUS | Status: AC
  Administered 2018-11-28 – 2018-11-29 (×4): 1000 mg via INTRAVENOUS
  Filled 2018-11-28 (×4): qty 100

## 2018-11-28 MED ORDER — MAGNESIUM CITRATE PO SOLN: 1.0000 | Freq: Once | ORAL | Status: DC | PRN

## 2018-11-28 MED ORDER — KETOROLAC TROMETHAMINE 15 MG/ML IJ SOLN
15.0000 mg | Freq: Four times a day (QID) | INTRAMUSCULAR | Status: AC
  Administered 2018-11-28 – 2018-11-29 (×4): 15 mg via INTRAVENOUS
  Filled 2018-11-28 (×3): qty 1

## 2018-11-28 MED ORDER — PROPOFOL 500 MG/50ML IV EMUL
INTRAVENOUS | Status: DC | PRN
  Administered 2018-11-28: 75 ug/kg/min via INTRAVENOUS

## 2018-11-28 MED ORDER — PHENYLEPHRINE 40 MCG/ML (10ML) SYRINGE FOR IV PUSH (FOR BLOOD PRESSURE SUPPORT)
PREFILLED_SYRINGE | INTRAVENOUS | Status: AC
  Filled 2018-11-28: qty 10

## 2018-11-28 MED ORDER — PROPOFOL 10 MG/ML IV BOLUS
INTRAVENOUS | Status: AC
  Filled 2018-11-28: qty 20

## 2018-11-28 MED ORDER — INSULIN ASPART 100 UNIT/ML ~~LOC~~ SOLN: 0.0000 [IU] | Freq: Every day | SUBCUTANEOUS | Status: DC

## 2018-11-28 MED ORDER — MENTHOL 3 MG MT LOZG: 1.0000 | LOZENGE | OROMUCOSAL | Status: DC | PRN

## 2018-11-28 MED ORDER — HYDROMORPHONE HCL 1 MG/ML IJ SOLN: 0.5000 mg | INTRAMUSCULAR | Status: DC | PRN

## 2018-11-28 MED ORDER — CEFAZOLIN SODIUM-DEXTROSE 1-4 GM/50ML-% IV SOLN
INTRAVENOUS | Status: AC
  Filled 2018-11-28: qty 50

## 2018-11-28 MED ORDER — OXYCODONE HCL 5 MG PO TABS
ORAL_TABLET | ORAL | Status: AC
  Filled 2018-11-28: qty 2

## 2018-11-28 MED ORDER — ASPIRIN EC 325 MG PO TBEC
325.0000 mg | DELAYED_RELEASE_TABLET | Freq: Every day | ORAL | Status: DC
  Administered 2018-11-29 – 2018-11-30 (×2): 325 mg via ORAL
  Filled 2018-11-28 (×2): qty 1

## 2018-11-28 MED ORDER — LIDOCAINE 2% (20 MG/ML) 5 ML SYRINGE
INTRAMUSCULAR | Status: AC
  Filled 2018-11-28: qty 5

## 2018-11-28 MED ORDER — BUPIVACAINE-EPINEPHRINE (PF) 0.25% -1:200000 IJ SOLN
INTRAMUSCULAR | Status: DC | PRN
  Administered 2018-11-28: 30 mL

## 2018-11-28 MED ORDER — CEFAZOLIN SODIUM-DEXTROSE 2-4 GM/100ML-% IV SOLN
2.0000 g | INTRAVENOUS | Status: AC
  Administered 2018-11-28: 2 g via INTRAVENOUS

## 2018-11-28 MED ORDER — BUPIVACAINE IN DEXTROSE 0.75-8.25 % IT SOLN
INTRATHECAL | Status: DC | PRN
  Administered 2018-11-28: 1.6 mL via INTRATHECAL

## 2018-11-28 MED ORDER — LACTATED RINGERS IV SOLN
INTRAVENOUS | Status: DC | PRN
  Administered 2018-11-28 (×2): via INTRAVENOUS

## 2018-11-28 MED ORDER — LORATADINE 10 MG PO TABS
10.0000 mg | ORAL_TABLET | Freq: Every day | ORAL | Status: DC
  Administered 2018-11-29 – 2018-11-30 (×2): 10 mg via ORAL
  Filled 2018-11-28 (×2): qty 1

## 2018-11-28 MED ORDER — HYDROMORPHONE HCL 1 MG/ML IJ SOLN
INTRAMUSCULAR | Status: AC
  Filled 2018-11-28: qty 1

## 2018-11-28 MED ORDER — MIDAZOLAM HCL 5 MG/5ML IJ SOLN
INTRAMUSCULAR | Status: DC | PRN
  Administered 2018-11-28: 2 mg via INTRAVENOUS

## 2018-11-28 MED ORDER — PROPOFOL 10 MG/ML IV BOLUS
INTRAVENOUS | Status: DC | PRN
  Administered 2018-11-28: 20 mg via INTRAVENOUS

## 2018-11-28 MED ORDER — ALUM & MAG HYDROXIDE-SIMETH 200-200-20 MG/5ML PO SUSP: 30.0000 mL | ORAL | Status: DC | PRN

## 2018-11-28 MED ORDER — SODIUM CHLORIDE 0.9 % IV SOLN: INTRAVENOUS | Status: DC

## 2018-11-28 MED ORDER — TRANEXAMIC ACID-NACL 1000-0.7 MG/100ML-% IV SOLN
INTRAVENOUS | Status: AC
  Filled 2018-11-28: qty 100

## 2018-11-28 MED ORDER — METHOCARBAMOL 500 MG PO TABS
500.0000 mg | ORAL_TABLET | Freq: Four times a day (QID) | ORAL | Status: DC | PRN
  Administered 2018-11-28 – 2018-11-30 (×3): 500 mg via ORAL
  Filled 2018-11-28 (×2): qty 1

## 2018-11-28 MED ORDER — FENTANYL CITRATE (PF) 250 MCG/5ML IJ SOLN
INTRAMUSCULAR | Status: AC
  Filled 2018-11-28: qty 5

## 2018-11-28 MED ORDER — SODIUM CHLORIDE 0.9 % IV SOLN
75.0000 mL/h | INTRAVENOUS | Status: DC
  Administered 2018-11-28: 75 mL/h via INTRAVENOUS

## 2018-11-28 MED ORDER — BISACODYL 10 MG RE SUPP: 10.0000 mg | Freq: Every day | RECTAL | Status: DC | PRN

## 2018-11-28 MED ORDER — BUPIVACAINE-EPINEPHRINE (PF) 0.25% -1:200000 IJ SOLN
INTRAMUSCULAR | Status: AC
  Filled 2018-11-28: qty 30

## 2018-11-28 MED ORDER — ACETAMINOPHEN 325 MG PO TABS
325.0000 mg | ORAL_TABLET | Freq: Four times a day (QID) | ORAL | Status: DC | PRN
  Administered 2018-11-30: 650 mg via ORAL
  Filled 2018-11-28: qty 2

## 2018-11-28 MED ORDER — 0.9 % SODIUM CHLORIDE (POUR BTL) OPTIME
TOPICAL | Status: DC | PRN
  Administered 2018-11-28 (×2): 1000 mL

## 2018-11-28 MED ORDER — LOSARTAN POTASSIUM 50 MG PO TABS
75.0000 mg | ORAL_TABLET | Freq: Every day | ORAL | Status: DC
  Administered 2018-11-28 – 2018-11-30 (×3): 75 mg via ORAL
  Filled 2018-11-28 (×3): qty 1

## 2018-11-28 MED ORDER — HYDROMORPHONE HCL 1 MG/ML IJ SOLN
0.2500 mg | INTRAMUSCULAR | Status: DC | PRN
  Administered 2018-11-28 (×2): 0.5 mg via INTRAVENOUS

## 2018-11-28 MED ORDER — DOCUSATE SODIUM 100 MG PO CAPS
100.0000 mg | ORAL_CAPSULE | Freq: Two times a day (BID) | ORAL | Status: DC
  Administered 2018-11-29 – 2018-11-30 (×4): 100 mg via ORAL
  Filled 2018-11-28 (×4): qty 1

## 2018-11-28 MED ORDER — MIDAZOLAM HCL 2 MG/2ML IJ SOLN
INTRAMUSCULAR | Status: AC
  Filled 2018-11-28: qty 2

## 2018-11-28 MED ORDER — CEFAZOLIN SODIUM-DEXTROSE 1-4 GM/50ML-% IV SOLN
1.0000 g | Freq: Four times a day (QID) | INTRAVENOUS | Status: AC
  Administered 2018-11-28 (×2): 1 g via INTRAVENOUS
  Filled 2018-11-28: qty 50

## 2018-11-28 MED ORDER — METOCLOPRAMIDE HCL 5 MG PO TABS: 5.0000 mg | ORAL_TABLET | Freq: Three times a day (TID) | ORAL | Status: DC | PRN

## 2018-11-28 SURGICAL SUPPLY — 62 items
BLADE SAW SAG 73X25 THK (BLADE) ×1
BLADE SAW SGTL 73X25 THK (BLADE) ×2 IMPLANT
COVER SURGICAL LIGHT HANDLE (MISCELLANEOUS) ×3 IMPLANT
COVER WAND RF STERILE (DRAPES) ×3 IMPLANT
CUP SECTOR GRIPTON 50MM (Cup) ×2 IMPLANT
DECANTER SPIKE VIAL GLASS SM (MISCELLANEOUS) ×3 IMPLANT
DRAPE INCISE IOBAN 66X45 STRL (DRAPES) ×2 IMPLANT
DRAPE ORTHO SPLIT 77X108 STRL (DRAPES) ×4
DRAPE SURG ORHT 6 SPLT 77X108 (DRAPES) ×2 IMPLANT
DRSG MEPILEX BORDER 4X12 (GAUZE/BANDAGES/DRESSINGS) ×3 IMPLANT
DURAPREP 26ML APPLICATOR (WOUND CARE) ×6 IMPLANT
ELECT BLADE 4.0 EZ CLEAN MEGAD (MISCELLANEOUS) ×3
ELECT REM PT RETURN 9FT ADLT (ELECTROSURGICAL) ×3
ELECTRODE BLDE 4.0 EZ CLN MEGD (MISCELLANEOUS) IMPLANT
ELECTRODE REM PT RTRN 9FT ADLT (ELECTROSURGICAL) ×1 IMPLANT
ELIMINATOR HOLE APEX DEPUY (Hips) ×2 IMPLANT
EVACUATOR 1/8 PVC DRAIN (DRAIN) IMPLANT
FACESHIELD WRAPAROUND (MASK) ×9 IMPLANT
FACESHIELD WRAPAROUND OR TEAM (MASK) ×3 IMPLANT
GLOVE BIOGEL PI IND STRL 7.0 (GLOVE) IMPLANT
GLOVE BIOGEL PI IND STRL 8 (GLOVE) ×2 IMPLANT
GLOVE BIOGEL PI IND STRL 8.5 (GLOVE) ×1 IMPLANT
GLOVE BIOGEL PI INDICATOR 7.0 (GLOVE) ×2
GLOVE BIOGEL PI INDICATOR 8 (GLOVE) ×4
GLOVE BIOGEL PI INDICATOR 8.5 (GLOVE) ×2
GLOVE ECLIPSE 8.0 STRL XLNG CF (GLOVE) ×10 IMPLANT
GLOVE ECLIPSE 8.5 STRL (GLOVE) ×6 IMPLANT
GLOVE SURG SS PI 6.5 STRL IVOR (GLOVE) ×2 IMPLANT
GOWN STRL REUS W/ TWL LRG LVL3 (GOWN DISPOSABLE) ×2 IMPLANT
GOWN STRL REUS W/TWL 2XL LVL3 (GOWN DISPOSABLE) ×6 IMPLANT
GOWN STRL REUS W/TWL LRG LVL3 (GOWN DISPOSABLE) ×6
HANDPIECE INTERPULSE COAX TIP (DISPOSABLE) ×2
HEAD FEM STD 32X+1 STRL (Hips) ×2 IMPLANT
IMMOBILIZER KNEE 22 UNIV (SOFTGOODS) ×3 IMPLANT
KIT BASIN OR (CUSTOM PROCEDURE TRAY) ×3 IMPLANT
KIT TURNOVER KIT B (KITS) ×3 IMPLANT
LINER PINN ALTRX ACE P4 HIP (Liner) ×2 IMPLANT
MANIFOLD NEPTUNE II (INSTRUMENTS) ×3 IMPLANT
NEEDLE 22X1 1/2 (OR ONLY) (NEEDLE) ×3 IMPLANT
NS IRRIG 1000ML POUR BTL (IV SOLUTION) ×5 IMPLANT
PACK TOTAL JOINT (CUSTOM PROCEDURE TRAY) ×3 IMPLANT
PAD ARMBOARD 7.5X6 YLW CONV (MISCELLANEOUS) ×5 IMPLANT
SET HNDPC FAN SPRY TIP SCT (DISPOSABLE) IMPLANT
STAPLER VISISTAT 35W (STAPLE) IMPLANT
STEM AML 12X155X30 STD SM 6IN (Joint) ×2 IMPLANT
SUCTION FRAZIER HANDLE 10FR (MISCELLANEOUS) ×2
SUCTION TUBE FRAZIER 10FR DISP (MISCELLANEOUS) ×1 IMPLANT
SUT BONE WAX W31G (SUTURE) IMPLANT
SUT ETHIBOND NAB CT1 #1 30IN (SUTURE) ×9 IMPLANT
SUT MNCRL AB 3-0 PS2 18 (SUTURE) ×3 IMPLANT
SUT VIC AB 0 CT1 27 (SUTURE) ×4
SUT VIC AB 0 CT1 27XBRD ANBCTR (SUTURE) ×2 IMPLANT
SUT VIC AB 1 CT1 27 (SUTURE) ×4
SUT VIC AB 1 CT1 27XBRD ANBCTR (SUTURE) ×2 IMPLANT
SUT VIC AB 2-0 CT1 27 (SUTURE) ×2
SUT VIC AB 2-0 CT1 TAPERPNT 27 (SUTURE) ×1 IMPLANT
SYR CONTROL 10ML LL (SYRINGE) ×3 IMPLANT
TOWEL OR 17X24 6PK STRL BLUE (TOWEL DISPOSABLE) ×3 IMPLANT
TOWEL OR 17X26 10 PK STRL BLUE (TOWEL DISPOSABLE) ×3 IMPLANT
TRAY FOLEY MTR SLVR 16FR STAT (SET/KITS/TRAYS/PACK) ×2 IMPLANT
WATER STERILE IRR 1000ML POUR (IV SOLUTION) ×3 IMPLANT
WRAP KNEE MAXI GEL POST OP (GAUZE/BANDAGES/DRESSINGS) ×3 IMPLANT

## 2018-11-28 NOTE — Plan of Care (Signed)
?  Problem: Education: ?Goal: Knowledge of General Education information will improve ?Description: Including pain rating scale, medication(s)/side effects and non-pharmacologic comfort measures ?Outcome: Progressing ?  ?Problem: Health Behavior/Discharge Planning: ?Goal: Ability to manage health-related needs will improve ?Outcome: Progressing ?  ?Problem: Coping: ?Goal: Level of anxiety will decrease ?Outcome: Progressing ?  ?

## 2018-11-28 NOTE — Transfer of Care (Signed)
Immediate Anesthesia Transfer of Care Note  Patient: Barbara Adkins  Procedure(s) Performed: TOTAL HIP ARTHROPLASTY (Left Hip)  Patient Location: PACU  Anesthesia Type:MAC and Spinal  Level of Consciousness: awake, alert , oriented and patient cooperative  Airway & Oxygen Therapy: Patient Spontanous Breathing and Patient connected to face mask oxygen  Post-op Assessment: Report given to RN and Post -op Vital signs reviewed and stable  Post vital signs: Reviewed  Last Vitals:  Vitals Value Taken Time  BP 123/99 11/28/2018  9:32 AM  Temp 36.6 C 11/28/2018  9:32 AM  Pulse 59 11/28/2018  9:33 AM  Resp 18 11/28/2018  9:33 AM  SpO2 100 % 11/28/2018  9:33 AM  Vitals shown include unvalidated device data.  Last Pain:  Vitals:   11/28/18 0616  TempSrc: Oral  PainSc:       Patients Stated Pain Goal: 3 (16/10/96 0454)  Complications: No apparent anesthesia complications

## 2018-11-28 NOTE — Op Note (Signed)
NAMECLARIECE, ROESLER MEDICAL RECORD UE:[id number] ACCOUNT [id number] DATE OF BIRTH:[birth year] FACILITY: [facility] LOCATION: [place], [place]  OPERATIVE REPORT  DATE OF PROCEDURE:  11/28/2018  PREOPERATIVE DIAGNOSIS:  End-stage osteoarthritis, left hip.  POSTOPERATIVE DIAGNOSIS:  End-stage osteoarthritis, left hip.  PROCEDURE:  Left total hip replacement.  SURGEON:  Joni Fears, MD  ASSISTANT:  Biagio Borg, PA-C, who was present throughout the operative procedure to ensure its timely completion.  ANESTHESIA:  IV sedation and spinal.  COMPLICATIONS:  None.  COMPONENTS:  DePuy AML small stature 12 mm femoral stem, 32 mm outer diameter hip ball with a +1.5 mm neck length.  A Gription 3 metallic acetabular shell, 50 mm outer diameter with an apex hole eliminator and a Marathon polyethylene liner.  All  components were press-fit.  DESCRIPTION OF PROCEDURE:  The patient was met in the holding area and identified the left hip was the appropriate operative site and marked it accordingly.  Any questions were answered.  The patient was then transported to room 6.  She was then placed  in the sitting position.  Anesthesia performed a spinal without complications.  With IV sedation, the patient was then placed in the lateral decubitus position with the left side up.  She was then secured to the operating table with the Innomed hip system.  The left lower extremity was then prepped with chlorhexidine scrub and DuraPrep x2 from the iliac crest to the ankle.  Sterile draping was performed.  Timeout was called.  A routine Southern incision was utilized and via sharp dissection carried down to the subcutaneous tissue.  Abundant adipose tissue was then incised.  The IT band was identified and incised.  Muscle fibers were then bluntly split by finger dissection and  then self-retaining retractor was inserted.  With the hip internally rotated, the short external  rotators were identified.  These were carefully incised.  Any tendinous structures were tagged with 0 Ethibond suture.  The hip capsule was identified and incised along the femoral neck and head.  The hip was easily dislocated posteriorly.  Using the calcar guide, the head was osteotomized at about a fingerbreadth proximal to the lesser trochanter.  Retractors then placed  about the proximal femur.  A starter hole was then made to the piriformis fossa.  Reaming was then performed sequentially to 11.5 to accept a 12 mm component.  Rasping was performed from a 10.5 to a 12 mm standard femoral rasp.  We had very nice cut on  the calcar with a flush fit.  Retractors were then placed about the acetabulum.  Soft tissue was excised.  Reaming was performed sequentially to 49 mm to accept a 50 mm component.  We had excellent bleeding circumferentially.  I trialed a 48 mm component that would completely seat  with good rim fit.  We elected to proceed with a 50 mm component.  This was impacted in place using the Gription 3 component.  It was nice and stable and did not require any screws.  We then applied the trial polyethylene liner followed by the 12 mm femoral stem and a 32 mm outer diameter hip ball with a +1.5 mm neck length.  This was reduced and under full range of motion we could not sublux or dislocate the hip.  Leg lengths  appeared to be symmetrical.  Trial components were removed.  The joint was copiously irrigated with saline solution.  The apex hole eliminator was then inserted followed by the Marathon polyethylene  liner with a 10 degree posterior lip.  The wound was again irrigated with saline solution.  The 12 mm small stature femoral component was then impacted flush on the calcar.  I cleaned the Morse taper neck and then applied the 32 mm outer diameter hip ball with a +1.5 mm neck length.  This was  reduced after cleaning any soft tissue from the acetabulum.  Again, through a full range of  motion, we could not sublux the hip.  The wound was again irrigated with saline solution.  I injected the capsule with 0.25% Marcaine with epinephrine.  We applied the tranexamic acid topically under compression for over 5 minutes.  The capsule was then closed anatomically with #1 Ethibond.   The short external rotators with the same material.  IT band was closed with a running #1 Vicryl.  SubQ was closed in several layers with Vicryl and Monocryl and then skin clips.  Sterile bulky dressing was applied.  The patient was placed in the supine position with the knee immobilizer.  Nursing staff  inserted a Foley catheter.  Urine was clear.  The patient tolerated the procedure without complications.  TN/NUANCE  D:11/28/2018 T:11/28/2018 JOB:[id number]

## 2018-11-28 NOTE — Evaluation (Signed)
Physical Therapy Evaluation Patient Details Name: Barbara Adkins MRN: [id number] DOB: 09-10-1951 Today's Date: 11/28/2018   History of Present Illness  Patient is 68 y/o female s/p L THA, posterior approach. PMH consits of HTN, dyslipidema, and prediabetes  Clinical Impression  Pt s/p surgery above with deficits below. Pt very sleepy during session, so mobility limited to chair. Required mod A to stand and min guard to transfer to chair using RW. Reviewed posterior hip precautions and supine HEP. Will continue to follow acutely to maximize functional mobility independence and safety.     Follow Up Recommendations Follow surgeon's recommendation for DC plan and follow-up therapies;Supervision for mobility/OOB    Equipment Recommendations  3in1 (PT);Rolling walker with 5" wheels    Recommendations for Other Services OT consult     Precautions / Restrictions Precautions Precautions: Posterior Hip Precaution Booklet Issued: Yes (comment) Precaution Comments: reviewed precautions with patient Required Braces or Orthoses: Knee Immobilizer - Left Knee Immobilizer - Left: Other (comment)(when in bed) Restrictions Weight Bearing Restrictions: Yes LLE Weight Bearing: Weight bearing as tolerated      Mobility  Bed Mobility Overal bed mobility: Needs Assistance Bed Mobility: Supine to Sit     Supine to sit: Min assist     General bed mobility comments: Min A for LLE assist. Increased time required and required cues to maintain posterior precautions.   Transfers Overall transfer level: Needs assistance Equipment used: Rolling walker (2 wheeled) Transfers: Sit to/from [facility] Sit to Stand: Mod assist Stand pivot transfers: Min guard       General transfer comment: Mod A for lift assist and steadying. Cues to leave LLE kicked out to maintain precautions during transfer and cues for safe hand placement. Min guard for safety throughout transfer. Cues for  sequencing using RW.   Ambulation/Gait                Stairs            Wheelchair Mobility    Modified Rankin (Stroke Patients Only)       Balance Overall balance assessment: Needs assistance Sitting-balance support: No upper extremity supported;Feet supported Sitting balance-Leahy Scale: Good     Standing balance support: Bilateral upper extremity supported;During functional activity Standing balance-Leahy Scale: Poor Standing balance comment: Required BUE support                              Pertinent Vitals/Pain Pain Assessment: 0-10 Pain Score: 3  Pain Location: L hip Pain Descriptors / Indicators: Aching Pain Intervention(s): Monitored during session    Home Living Family/patient expects to be discharged to:: Private residence Living Arrangements: Children(daughter) Available Help at Discharge: Family Type of Home: House Home Access: Stairs to enter Entrance Stairs-Rails: Right;Left(R on the side steps, both in front steps) Technical brewer of Steps: 3 Home Layout: One level Home Equipment: None      Prior Function Level of Independence: Independent               Hand Dominance        Extremity/Trunk Assessment   Upper Extremity Assessment Upper Extremity Assessment: Defer to OT evaluation    Lower Extremity Assessment Lower Extremity Assessment: LLE deficits/detail LLE Deficits / Details: Deficits consistent with post op pain and weakness. Sensation in tact. Able to perform ther ex below.     Cervical / Trunk Assessment Cervical / Trunk Assessment: Normal  Communication   Communication: No difficulties  Cognition Arousal/Alertness: Suspect due to medications Behavior During Therapy: WFL for tasks assessed/performed Overall Cognitive Status: Within Functional Limits for tasks assessed                                 General Comments: Pt very sleepy throughout session.       General  Comments General comments (skin integrity, edema, etc.): Pt's daughter and other family members present during session.     Exercises Total Joint Exercises Ankle Circles/Pumps: AROM;Right;Left;20 reps;Supine   Assessment/Plan    PT Assessment Patient needs continued PT services  PT Problem List Decreased strength;Decreased range of motion;Decreased activity tolerance;Pain;Decreased knowledge of use of DME;Decreased mobility;Decreased balance;Decreased safety awareness;Decreased knowledge of precautions       PT Treatment Interventions DME instruction;Gait training;Stair training;Functional mobility training;Therapeutic activities;Therapeutic exercise;Balance training;Patient/family education    PT Goals (Current goals can be found in the Care Plan section)  Acute Rehab PT Goals Patient Stated Goal: to go home PT Goal Formulation: With patient Time For Goal Achievement: 12/12/18 Potential to Achieve Goals: Good    Frequency 7X/week   Barriers to discharge        Co-evaluation               AM-PAC PT "6 Clicks" Mobility  Outcome Measure Help needed turning from your back to your side while in a flat bed without using bedrails?: A Little Help needed moving from lying on your back to sitting on the side of a flat bed without using bedrails?: A Little Help needed moving to and from a bed to a chair (including a wheelchair)?: A Little Help needed standing up from a chair using your arms (e.g., wheelchair or bedside chair)?: A Lot Help needed to walk in hospital room?: A Little Help needed climbing 3-5 steps with a railing? : A Lot 6 Click Score: 16    End of Session Equipment Utilized During Treatment: Gait belt;Left knee immobilizer Activity Tolerance: Patient limited by lethargy(Patient very tired) Patient left: in chair;with call bell/phone within reach;with family/visitor present Nurse Communication: Mobility status PT Visit Diagnosis: Difficulty in walking, not  elsewhere classified (R26.2);Pain;Muscle weakness (generalized) (M62.81) Pain - Right/Left: Left Pain - part of body: Hip    Time: 3893-7342 PT Time Calculation (min) (ACUTE ONLY): 26 min   Charges:   PT Evaluation $PT Eval Low Complexity: 1 Low PT Treatments $Gait Training: 8-22 mins        Leighton Ruff, PT, DPT  Acute Rehabilitation Services  Pager: [phone] Office: [phone]   Rudean Hitt 11/28/2018, 6:07 PM

## 2018-11-28 NOTE — Progress Notes (Signed)
Orthopedic Tech Progress Note Patient Details:  Barbara Adkins 08-23-1951 [id number]  Ortho Devices Type of Ortho Device: Knee Immobilizer Ortho Device/Splint Location: Trapeze bar Ortho Device/Splint Interventions: Application, Adjustment   Post Interventions Patient Tolerated: Well Instructions Provided: Care of device, Adjustment of device   [place] Pink 11/28/2018, 3:33 PM

## 2018-11-28 NOTE — Anesthesia Procedure Notes (Signed)
Procedure Name: MAC Date/Time: 11/28/2018 7:25 AM Performed by: Jenne Campus, CRNA Pre-anesthesia Checklist: Patient identified, Emergency Drugs available, Suction available and Patient being monitored Oxygen Delivery Method: Simple face mask

## 2018-11-28 NOTE — Anesthesia Procedure Notes (Signed)
Spinal  Patient location during procedure: OR Start time: 11/28/2018 7:15 AM End time: 11/28/2018 7:20 AM Staffing Anesthesiologist: Janeece Riggers, MD Preanesthetic Checklist Completed: patient identified, site marked, surgical consent, pre-op evaluation, timeout performed, IV checked, risks and benefits discussed and monitors and equipment checked Spinal Block Patient position: sitting Prep: DuraPrep Patient monitoring: heart rate, cardiac monitor, continuous pulse ox and blood pressure Approach: midline Location: L3-4 Injection technique: single-shot Needle Needle type: Sprotte  Needle gauge: 24 G Needle length: 9 cm Assessment Sensory level: T4

## 2018-11-28 NOTE — H&P (Signed)
The recent History & Physical has been reviewed. I have personally examined the patient today. There is no interval change to the documented History & Physical. The patient would like to proceed with the procedure.  Garald Balding 11/28/2018,  7:09 AM

## 2018-11-28 NOTE — Anesthesia Postprocedure Evaluation (Signed)
Anesthesia Post Note  Patient: Barbara Adkins  Procedure(s) Performed: TOTAL HIP ARTHROPLASTY (Left Hip)     Patient location during evaluation: PACU Anesthesia Type: Spinal Level of consciousness: oriented and awake and alert Pain management: pain level controlled Vital Signs Assessment: post-procedure vital signs reviewed and stable Respiratory status: spontaneous breathing, respiratory function stable and nonlabored ventilation Cardiovascular status: blood pressure returned to baseline and stable Postop Assessment: no headache, no backache, no apparent nausea or vomiting, spinal receding and patient able to bend at knees Anesthetic complications: no    Last Vitals:  Vitals:   11/28/18 1046 11/28/18 1116  BP: 125/80 126/76  Pulse: (!) 57 (!) 59  Resp: 14 11  Temp:    SpO2: 99% 99%    Last Pain:  Vitals:   11/28/18 1105  TempSrc:   PainSc: Asleep                 [name]

## 2018-11-28 NOTE — Brief Op Note (Signed)
PATIENT ID:      Barbara Adkins  MRN:     [id number] DOB/AGE:    [birth year] / 68 y.o.       OPERATIVE REPORT    DATE OF PROCEDURE:  11/28/2018       PREOPERATIVE DIAGNOSIS:END STAGE   LEFT HIP OSTEOARTHRITIS                                                       Estimated body mass index is 34.61 kg/m as calculated from the following:   Height as of this encounter: 5\' 2"  (1.575 m).   Weight as of this encounter: 85.8 kg.     POSTOPERATIVE DIAGNOSIS:END STAGE   LEFT HIP OSTEOARTHRITIS                                                                     Estimated body mass index is 34.61 kg/m as calculated from the following:   Height as of this encounter: 5\' 2"  (1.575 m).   Weight as of this encounter: 85.8 kg.     PROCEDURE:  Procedure(s): TOTAL HIP ARTHROPLASTY      SURGEON:  Joni Fears, MD    ASSISTANT:   Biagio Borg, PA-C   (Present and scrubbed throughout the case, critical for assistance with exposure, retraction, instrumentation, and closure.)          ANESTHESIA: spinal and IV sedation     DRAINS: none :      TOURNIQUET TIME: * No tourniquets in log *    COMPLICATIONS:  None   CONDITION:  stable  PROCEDURE IN [place] 11/28/2018, 8:59 AM  11/28/2018

## 2018-11-29 ENCOUNTER — Encounter (HOSPITAL_COMMUNITY): Payer: Self-pay | Admitting: Orthopaedic Surgery

## 2018-11-29 DIAGNOSIS — Z833 Family history of diabetes mellitus: Secondary | ICD-10-CM | POA: Diagnosis not present

## 2018-11-29 DIAGNOSIS — M1612 Unilateral primary osteoarthritis, left hip: Secondary | ICD-10-CM | POA: Diagnosis present

## 2018-11-29 DIAGNOSIS — E6609 Other obesity due to excess calories: Secondary | ICD-10-CM | POA: Diagnosis present

## 2018-11-29 DIAGNOSIS — D62 Acute posthemorrhagic anemia: Secondary | ICD-10-CM | POA: Diagnosis not present

## 2018-11-29 DIAGNOSIS — E119 Type 2 diabetes mellitus without complications: Secondary | ICD-10-CM | POA: Diagnosis present

## 2018-11-29 DIAGNOSIS — E785 Hyperlipidemia, unspecified: Secondary | ICD-10-CM | POA: Diagnosis present

## 2018-11-29 DIAGNOSIS — Z7984 Long term (current) use of oral hypoglycemic drugs: Secondary | ICD-10-CM | POA: Diagnosis not present

## 2018-11-29 DIAGNOSIS — I10 Essential (primary) hypertension: Secondary | ICD-10-CM | POA: Diagnosis present

## 2018-11-29 DIAGNOSIS — Z6834 Body mass index (BMI) 34.0-34.9, adult: Secondary | ICD-10-CM | POA: Diagnosis not present

## 2018-11-29 DIAGNOSIS — M25552 Pain in left hip: Secondary | ICD-10-CM | POA: Diagnosis present

## 2018-11-29 DIAGNOSIS — Z79899 Other long term (current) drug therapy: Secondary | ICD-10-CM | POA: Diagnosis not present

## 2018-11-29 DIAGNOSIS — Z8249 Family history of ischemic heart disease and other diseases of the circulatory system: Secondary | ICD-10-CM | POA: Diagnosis not present

## 2018-11-29 LAB — BASIC METABOLIC PANEL
Anion gap: 7 (ref 5–15)
BUN: 11 mg/dL (ref 8–23)
CO2: 26 mmol/L (ref 22–32)
Calcium: 8.3 mg/dL — ABNORMAL LOW (ref 8.9–10.3)
Chloride: 104 mmol/L (ref 98–111)
Creatinine, Ser: 0.96 mg/dL (ref 0.44–1.00)
GFR calc Af Amer: >60 mL/min (ref >60)
GFR calc non Af Amer: >60 mL/min (ref >60)
GLUCOSE: 103 mg/dL — AB (ref 70–99)
Potassium: 4.1 mmol/L (ref 3.5–5.1)
Sodium: 137 mmol/L (ref 135–145)

## 2018-11-29 LAB — CBC
HCT: 31.8 % — ABNORMAL LOW (ref 36.0–46.0)
Hemoglobin: 9.4 g/dL — ABNORMAL LOW (ref 12.0–15.0)
MCH: 20.8 pg — ABNORMAL LOW (ref 26.0–34.0)
MCHC: 29.6 g/dL — ABNORMAL LOW (ref 30.0–36.0)
MCV: 70.4 fL — ABNORMAL LOW (ref 80.0–100.0)
Platelets: 292 10*3/uL (ref 150–400)
RBC: 4.52 MIL/uL (ref 3.87–5.11)
RDW: 16.3 % — ABNORMAL HIGH (ref 11.5–15.5)
WBC: 9.4 10*3/uL (ref 4.0–10.5)
nRBC: 0 % (ref 0.0–0.2)

## 2018-11-29 LAB — GLUCOSE, CAPILLARY
Glucose-Capillary: 87 mg/dL (ref 70–99)
Glucose-Capillary: 109 mg/dL — ABNORMAL HIGH (ref 70–99)
Glucose-Capillary: 99 mg/dL (ref 70–99)
Glucose-Capillary: 115 mg/dL — ABNORMAL HIGH (ref 70–99)

## 2018-11-29 MED ORDER — WHITE PETROLATUM EX OINT
TOPICAL_OINTMENT | CUTANEOUS | Status: AC
  Administered 2018-11-29: 0.2
  Filled 2018-11-29: qty 28.35

## 2018-11-29 NOTE — Care Management Obs Status (Signed)
[facility] NOTIFICATION   Patient Details  Name: Barbara Adkins MRN: [id number] Date of Birth: 1950-11-22   Medicare Observation Status Notification Given:  Yes    Carles Collet, RN 11/29/2018, 11:42 AM

## 2018-11-29 NOTE — Progress Notes (Signed)
Physical Therapy Treatment Patient Details Name: Barbara Adkins MRN: [id number] DOB: [birth year] Today's Date: 11/29/2018    History of Present Illness Patient is 68 y/o female s/p L THA, posterior approach. PMH consits of HTN, dyslipidema, and prediabetes    PT Comments    Patient seen for mobility progression. Pt is progressing toward PT goals and tolerated gait training well. Posterior precautions reviewed. Plan for stair training next session.    Follow Up Recommendations  Follow surgeon's recommendation for DC plan and follow-up therapies;Supervision for mobility/OOB     Equipment Recommendations  3in1 (PT);Rolling walker with 5" wheels    Recommendations for Other Services OT consult     Precautions / Restrictions Precautions Precautions: Posterior Hip Precaution Booklet Issued: Yes (comment) Precaution Comments: reviewed precautions with patient Required Braces or Orthoses: Knee Immobilizer - Left Knee Immobilizer - Left: Other (comment)(when in bed) Restrictions Weight Bearing Restrictions: Yes LLE Weight Bearing: Weight bearing as tolerated    Mobility  Bed Mobility Overal bed mobility: Needs Assistance Bed Mobility: Supine to Sit     Supine to sit: Min guard     General bed mobility comments: min guard for safety and ensure precautions maintained; use of rails  Transfers Overall transfer level: Needs assistance Equipment used: Rolling walker (2 wheeled) Transfers: Sit to/from [facility] Sit to Stand: Min guard         General transfer comment: cues for safe hand placement  Ambulation/Gait Ambulation/Gait assistance: Min guard Gait Distance (Feet): 150 Feet Assistive device: Rolling walker (2 wheeled) Gait Pattern/deviations: Step-through pattern;Decreased stance time - left;Decreased step length - right;Decreased weight shift to left Gait velocity: decreased   General Gait Details: cues for step length symmetry and  increased bilat step lengths; pt with improving step through pattern with increased distance   Chief Strategy Officer    Modified Rankin (Stroke Patients Only)       Balance Overall balance assessment: Needs assistance Sitting-balance support: No upper extremity supported;Feet supported Sitting balance-Leahy Scale: Good     Standing balance support: Bilateral upper extremity supported;During functional activity Standing balance-Leahy Scale: Poor Standing balance comment: Required BUE support                             Cognition Arousal/Alertness: Awake/alert Behavior During Therapy: WFL for tasks assessed/performed Overall Cognitive Status: Within Functional Limits for tasks assessed                                        Exercises      General Comments        Pertinent Vitals/Pain Pain Assessment: Faces Faces Pain Scale: Hurts little more Pain Location: L hip Pain Descriptors / Indicators: Sore Pain Intervention(s): Limited activity within patient's tolerance;Monitored during session;Premedicated before session;Repositioned    Home Living                      Prior Function            PT Goals (current goals can now be found in the care plan section) Acute Rehab PT Goals Patient Stated Goal: to go home Progress towards PT goals: Progressing toward goals    Frequency    7X/week      PT Plan Current plan remains appropriate  Co-evaluation              AM-PAC PT "6 Clicks" Mobility   Outcome Measure  Help needed turning from your back to your side while in a flat bed without using bedrails?: A Little Help needed moving from lying on your back to sitting on the side of a flat bed without using bedrails?: A Little Help needed moving to and from a bed to a chair (including a wheelchair)?: A Little Help needed standing up from a chair using your arms (e.g., wheelchair or bedside  chair)?: A Little Help needed to walk in hospital room?: A Little Help needed climbing 3-5 steps with a railing? : A Little 6 Click Score: 18    End of Session Equipment Utilized During Treatment: Gait belt Activity Tolerance: Patient tolerated treatment well Patient left: in chair;with call bell/phone within reach Nurse Communication: Mobility status PT Visit Diagnosis: Difficulty in walking, not elsewhere classified (R26.2);Pain;Muscle weakness (generalized) (M62.81) Pain - Right/Left: Left Pain - part of body: Hip     Time: 2536-6440 PT Time Calculation (min) (ACUTE ONLY): 27 min  Charges:  $Gait Training: 23-37 mins                     Earney Navy, PTA Acute Rehabilitation Services Pager: [phone] Office: [phone]     Darliss Cheney 11/29/2018, 10:58 AM

## 2018-11-29 NOTE — Evaluation (Signed)
Occupational Therapy Evaluation Patient Details Name: Barbara Adkins MRN: [id number] DOB: 1951-01-23 Today's Date: 11/29/2018    History of Present Illness Patient is 68 y/o female s/p L THA, posterior approach. PMH consits of HTN, dyslipidema, and prediabetes   Clinical Impression   PATIENT WAS SEEN FOR SKILLED OT TO INCREASE I AND SAFETY WITH ADLS AND MOBILITY. PATIENT WAS EDUCATED ON USE OF AE AND DME AND ADHERE TO HIP PRECAUTIONS. PATIENT IS MOTIVATED TO WORK WITH OT TO MAXIMIZE FUNCTION AND I.     Follow Up Recommendations  Home health OT    Equipment Recommendations  3 in 1 bedside commode    Recommendations for Other Services       Precautions / Restrictions Precautions Precautions: Posterior Hip Precaution Booklet Issued: Yes (comment) Precaution Comments: reviewed precautions with patient Required Braces or Orthoses: Knee Immobilizer - Left Knee Immobilizer - Left: (WHEN IN BED) Restrictions Weight Bearing Restrictions: Yes LLE Weight Bearing: Weight bearing as tolerated      Mobility Bed Mobility Overal bed mobility: Needs Assistance Bed Mobility: Sit to Supine     Supine to sit: Supervision     General bed mobility comments: min guard for safety and ensure precautions maintained; use of rails  Transfers Overall transfer level: Needs assistance Equipment used: Rolling walker (2 wheeled) Transfers: Sit to/from [facility] Sit to Stand: Min guard Stand pivot transfers: Min guard       General transfer comment: cues for safe hand placement    Balance Overall balance assessment: Needs assistance Sitting-balance support: No upper extremity supported;Feet supported Sitting balance-Leahy Scale: Good     Standing balance support: Bilateral upper extremity supported;During functional activity Standing balance-Leahy Scale: Poor Standing balance comment: Required BUE support                            ADL either  performed or assessed with clinical judgement   ADL Overall ADL's : Needs assistance/impaired Eating/Feeding: Independent   Grooming: Wash/dry hands;Wash/dry face;Supervision/safety;Set up;Standing   Upper Body Bathing: Supervision/ safety;Set up;Sitting   Lower Body Bathing: Minimal assistance;Adhering to hip precautions   Upper Body Dressing : Supervision/safety;Set up;Sitting   Lower Body Dressing: Minimal assistance;With adaptive equipment;Adhering to hip precautions;Sit to/from stand   Toilet Transfer: Min guard;Adhering to hip precautions;Ambulation;BSC   Toileting- Clothing Manipulation and Hygiene: Independent;Supervision/safety;Sit to/from stand       Functional mobility during ADLs: Min guard General ADL Comments: PATIENT ED ON HIP PRECAUTIONS AND USE OF AE AND DME FOR TRANSFERS.     Vision Baseline Vision/History: No visual deficits Patient Visual Report: No change from baseline       Perception     Praxis      Pertinent Vitals/Pain Pain Assessment: Faces Pain Score: 1  Faces Pain Scale: Hurts little more Pain Location: L HIP Pain Descriptors / Indicators: Sore Pain Intervention(s): Premedicated before session     Hand Dominance     Extremity/Trunk Assessment Upper Extremity Assessment Upper Extremity Assessment: Overall WFL for tasks assessed           Communication Communication Communication: No difficulties   Cognition Arousal/Alertness: Awake/alert Behavior During Therapy: WFL for tasks assessed/performed Overall Cognitive Status: Within Functional Limits for tasks assessed                                     General Comments  Exercises     Shoulder Instructions      Home Living Family/patient expects to be discharged to:: Private residence Living Arrangements: Children Available Help at Discharge: Family Type of Home: House Home Access: Stairs to enter Technical brewer of Steps: 3 Entrance  Stairs-Rails: Right;Left Home Layout: One level     Bathroom Shower/Tub: Occupational psychologist: Standard     Home Equipment: None          Prior Functioning/Environment Level of Independence: Independent                 OT Problem List: Decreased knowledge of use of DME or AE;Decreased knowledge of precautions      OT Treatment/Interventions: Self-care/ADL training;DME and/or AE instruction    OT Goals(Current goals can be found in the care plan section) Acute Rehab OT Goals Patient Stated Goal: TO GO HOME OT Goal Formulation: With patient Time For Goal Achievement: 12/13/18 Potential to Achieve Goals: Good  OT Frequency: Min 2X/week   Barriers to D/C:            Co-evaluation              AM-PAC OT "6 Clicks" Daily Activity     Outcome Measure Help from another person eating meals?: None Help from another person taking care of personal grooming?: A Little Help from another person toileting, which includes using toliet, bedpan, or urinal?: A Little Help from another person bathing (including washing, rinsing, drying)?: A Little Help from another person to put on and taking off regular upper body clothing?: A Little Help from another person to put on and taking off regular lower body clothing?: A Little 6 Click Score: 19   End of Session Equipment Utilized During Treatment: Gait belt;Rolling walker Nurse Communication: (OK THERAY)  Activity Tolerance: Patient tolerated treatment well Patient left: in bed;with call bell/phone within reach;with bed alarm set  OT Visit Diagnosis: Other abnormalities of gait and mobility (R26.89)                Time: 7616-0737 OT Time Calculation (min): 54 min Charges:  OT General Charges $OT Visit: 1 Visit OT Evaluation $OT Eval Low Complexity: 1 Low OT Treatments $Self Care/Home Management : 10-62 mins 6 CLICKS  Cristle Jared 11/29/2018, 11:49 AM

## 2018-11-29 NOTE — Care Management CC44 (Signed)
Condition Code 44 Documentation Completed  Patient Details  Name: ANALLELY ROSELL MRN: [id number] Date of Birth: 04-07-1951   Condition Code 44 given:  Yes Patient signature on Condition Code 44 notice:  Yes Documentation of 2 MD's agreement:  Yes Code 44 added to claim:  Yes    Carles Collet, RN 11/29/2018, 11:42 AM

## 2018-11-29 NOTE — Care Management Note (Signed)
Case Management Note  Patient Details  Name: Barbara Adkins MRN: [id number] Date of Birth: 1951-01-26  Subjective/Objective:                    Action/Plan:  Spoke with patient at bedside. She states her daughter will help her after DC. She is agreeable to [facility] for [facility] services as pre arranged by office. Patient needs RW and 3/1. Requested [facility] to bring to room on 1/15. No other CM needs identified at this time.  Expected Discharge Date:  11/30/18               Expected Discharge Plan:  [place]  In-House Referral:     Discharge planning Services  CM Consult  Post Acute Care Choice:  Home Health, Durable Medical Equipment Choice offered to:  Patient  DME Arranged:  3-N-1, Walker rolling DME Agency:  [place]:  PT [place]:  [facility]  Status of Service:  Completed, signed off  If discussed at [employer] of Stay Meetings, dates discussed:    Additional Comments:  Carles Collet, RN 11/29/2018, 11:35 AM

## 2018-11-29 NOTE — Progress Notes (Signed)
Physical Therapy Treatment Patient Details Name: Barbara Adkins MRN: [id number] DOB: [birth year] Today's Date: 11/29/2018    History of Present Illness Patient is 68 y/o female s/p L THA, posterior approach. PMH consits of HTN, dyslipidema, and prediabetes    PT Comments    Patient seen for mobility progression. Pt continues to make progress toward PT goals and tolerated increased gait training distance and stair training this session with supervision/min guard for safety overall. Pt recalls 3/3 post hip precautions. Review HEP next session. Pt has handout. Pt in bed with KI donned end of session. Current plan remains appropriate.    Follow Up Recommendations  Follow surgeon's recommendation for DC plan and follow-up therapies;Supervision for mobility/OOB     Equipment Recommendations  3in1 (PT);Rolling walker with 5" wheels    Recommendations for Other Services OT consult     Precautions / Restrictions Precautions Precautions: Posterior Hip Precaution Booklet Issued: Yes (comment) Precaution Comments: pt recalls 3/3 precautions Required Braces or Orthoses: Knee Immobilizer - Left Knee Immobilizer - Left: Other (comment)(when in bed) Restrictions Weight Bearing Restrictions: Yes LLE Weight Bearing: Weight bearing as tolerated    Mobility  Bed Mobility Overal bed mobility: Needs Assistance Bed Mobility: Supine to Sit;Sit to Supine     Supine to sit: Min guard Sit to supine: Min guard   General bed mobility comments: min guard for safety; pt is able to maintain hip precautions without assistance  Transfers Overall transfer level: Needs assistance Equipment used: Rolling walker (2 wheeled) Transfers: Sit to/from Stand Sit to Stand: Supervision Stand pivot transfers: Min guard       General transfer comment: safe hand placement demonstrated  Ambulation/Gait Ambulation/Gait assistance: Min guard;Supervision Gait Distance (Feet): 300 Feet Assistive device:  Rolling walker (2 wheeled) Gait Pattern/deviations: Step-through pattern;Decreased weight shift to left;Decreased stride length Gait velocity: decreased   General Gait Details: worked on step length symmetry and keeping RW moving for more fluid gait pattern; pt is improving with weight bearing and stride length   Stairs Stairs: Yes Stairs assistance: Min guard Stair Management: Two rails;Step to pattern;Forwards Number of Stairs: (2 steps X 2 trials) General stair comments: cues for sequencing and technique   Wheelchair Mobility    Modified Rankin (Stroke Patients Only)       Balance Overall balance assessment: Needs assistance Sitting-balance support: No upper extremity supported;Feet supported Sitting balance-Leahy Scale: Good     Standing balance support: Bilateral upper extremity supported;During functional activity Standing balance-Leahy Scale: Poor Standing balance comment: Required BUE support                             Cognition Arousal/Alertness: Awake/alert Behavior During Therapy: WFL for tasks assessed/performed Overall Cognitive Status: Within Functional Limits for tasks assessed                                        Exercises      General Comments        Pertinent Vitals/Pain Pain Assessment: Faces Pain Score: 1  Faces Pain Scale: Hurts a little bit Pain Location: L hip Pain Descriptors / Indicators: Sore Pain Intervention(s): Monitored during session;Premedicated before session;Repositioned    Home Living Family/patient expects to be discharged to:: Private residence Living Arrangements: Children Available Help at Discharge: Family Type of Home: House Home Access: Stairs to enter Entrance Stairs-Rails: Right;Left Home  Layout: One level Home Equipment: None      Prior Function Level of Independence: Independent          PT Goals (current goals can now be found in the care plan section) Acute Rehab PT  Goals Patient Stated Goal: to go home Progress towards PT goals: Progressing toward goals    Frequency    7X/week      PT Plan Current plan remains appropriate    Co-evaluation              AM-PAC PT "6 Clicks" Mobility   Outcome Measure  Help needed turning from your back to your side while in a flat bed without using bedrails?: A Little Help needed moving from lying on your back to sitting on the side of a flat bed without using bedrails?: A Little Help needed moving to and from a bed to a chair (including a wheelchair)?: A Little Help needed standing up from a chair using your arms (e.g., wheelchair or bedside chair)?: A Little Help needed to walk in hospital room?: A Little Help needed climbing 3-5 steps with a railing? : A Little 6 Click Score: 18    End of Session Equipment Utilized During Treatment: Gait belt Activity Tolerance: Patient tolerated treatment well Patient left: with call bell/phone within reach;in bed;Other (comment)(with KI donned) Nurse Communication: Mobility status PT Visit Diagnosis: Difficulty in walking, not elsewhere classified (R26.2);Pain;Muscle weakness (generalized) (M62.81) Pain - Right/Left: Left Pain - part of body: Hip     Time: 1610-9604 PT Time Calculation (min) (ACUTE ONLY): 33 min  Charges:  $Gait Training: 23-37 mins                     Earney Navy, PTA Acute Rehabilitation Services Pager: [phone] Office: [phone]     Darliss Cheney 11/29/2018, 2:42 PM

## 2018-11-29 NOTE — Progress Notes (Signed)
PATIENT ID: Barbara Adkins        MRN:  [id number]          DOB/AGE: [birth year] / 68 y.o.    Joni Fears, MD   Biagio Borg, PA-C [address] [place], [place]  [address]                             [phone]   PROGRESS NOTE  Subjective:  negative for Chest Pain  negative for Shortness of Breath  negative for Nausea/Vomiting   negative for Calf Pain    Tolerating Diet: yes         Patient reports pain as mild.     Comfortable at rest-no complaints  Objective: Vital signs in last 24 hours:    Patient Vitals for the past 24 hrs:  BP Temp Temp src Pulse Resp SpO2 Height  11/29/18 0449 (!) 145/79 98.2 F (36.8 C) Oral (!) 57 18 100 % -  11/28/18 2349 126/82 97.8 F (36.6 C) Oral 60 20 100 % -  11/28/18 1928 134/74 97.8 F (36.6 C) Oral (!) 58 16 96 % -  11/28/18 1547 121/74 98.2 F (36.8 C) Oral 70 17 99 % 5\' 2"  (1.575 m)  11/28/18 1530 121/79 98 F (36.7 C) - 64 17 100 % -  11/28/18 1515 124/78 - - 60 16 100 % -  11/28/18 1446 118/76 - - 64 15 (!) 88 % -  11/28/18 1416 113/72 - - 62 17 98 % -  11/28/18 1346 111/66 - - 63 16 95 % -  11/28/18 1316 101/68 - - 64 14 93 % -  11/28/18 1246 106/69 - - 67 11 92 % -  11/28/18 1216 111/74 - - 63 16 97 % -  11/28/18 1146 115/85 - - (!) 59 13 99 % -  11/28/18 1116 126/76 - - (!) 59 11 99 % -  11/28/18 1046 125/80 - - (!) 57 14 99 % -  11/28/18 1031 120/71 - - (!) 58 17 100 % -  11/28/18 1016 116/71 - - 62 15 96 % -  11/28/18 1001 122/63 - - (!) 58 17 100 % -  11/28/18 0958 119/77 - - (!) 57 12 100 % -  11/28/18 0945 (!) 122/109 - - (!) 57 17 100 % -  11/28/18 0932 (!) 123/99 97.8 F (36.6 C) - 63 17 100 % -      Intake/Output from previous day:   01/14 0701 - 01/15 0700 In: 2287.5 [I.V.:1437.5] Out: 1525 [Urine:1275]   Intake/Output this shift:   No intake/output data recorded.   Intake/Output      01/14 0701 - 01/15 0700 01/15 0701 - 01/16 0700   I.V. (mL/kg) 1437.5 (16.8)    Other 800    IV  Piggyback 50    Total Intake(mL/kg) 2287.5 (26.7)    Urine (mL/kg/hr) 1275 (0.6)    Blood 250    Total Output 1525    Net +762.5            LABORATORY DATA: Recent Labs    11/29/18 0146  WBC 9.4  HGB 9.4*  HCT 31.8*  PLT 292   Recent Labs    11/29/18 0146  NA 137  K 4.1  CL 104  CO2 26  BUN 11  CREATININE 0.96  GLUCOSE 103*  CALCIUM 8.3*   Lab Results  Component Value Date   INR 1.04 11/20/2018  Recent Radiographic Studies :  Dg Chest 2 View  Result Date: 11/20/2018 CLINICAL DATA:  68 year old female with a history of preoperative testing for hip arthroplasty EXAM: CHEST - 2 VIEW COMPARISON:  10/07/2012 FINDINGS: The heart size and mediastinal contours are within normal limits. Both lungs are clear. Mild scoliotic curvature of the thoracolumbar spine. IMPRESSION: Negative for acute cardiopulmonary disease. Electronically Signed   By: Corrie Mckusick D.O.   On: 11/20/2018 09:46   Dg Bone Density  Result Date: 11/13/2018 EXAM: DUAL X-RAY ABSORPTIOMETRY (DXA) FOR BONE MINERAL DENSITY IMPRESSION: Referring Physician:  Forrest Moron Your patient completed a BMD test using Lunar IDXA DXA system ( analysis version: 16 ) manufactured by [employer]. Technologist: WLS PATIENT: Name: Lavone, Barrientes Patient ID: [id number] Birth Date: 12/27/1950 Height: 62.0 in. Sex: Female Measured: 11/13/2018 Weight: 186.6 lbs. Indications: Estrogen Deficient, Height Loss (781.91), Low Calcium Intake (269.3), Omeprazole, Postmenopausal Fractures: None Treatments: None ASSESSMENT: The BMD measured at Femur Neck Left is 0.841 g/cm2 with a T-score of -1.4. This patient's diagnostic category is LOW BONE MASS/OSTEOPENIA according to [facility]) criteria. The scan quality is good. L-4 was excluded due to degenerative changes. Site Region Measured Date Measured Age YA BMD Significant CHANGE T-score DualFemur Neck Left  11[age]13/2018    67.6         -1.4    0.841 g/cm2 AP Spine   L1-L3      11[age]13/2018    [age]         -0.8    1.074 g/cm2 DualFemur Total Mean 11/13/2018    67.6         -1.0    0.879 g/cm2 World Health Organization [facility]) criteria for post-menopausal, Caucasian Women: Normal       T-score at or above -1 SD Osteopenia   T-score between -1 and -2.5 SD Osteoporosis T-score at or below -2.5 SD RECOMMENDATION: 1. All patients should optimize calcium and vitamin D intake. 2. Consider FDA approved medical therapies in postmenopausal women and men aged 94 years and older, based on the following: a. A hip or vertebral (clinical or morphometric) fracture b. T- score < or = -2.5 at the femoral neck or spine after appropriate evaluation to exclude secondary causes c. Low bone mass (T-score between -1.0 and -2.5 at the femoral neck or spine) and a 10 year probability of a hip fracture > or = 3% or a 10 year probability of a major osteoporosis-related fracture > or = 20% based on the [place]-adapted WHO algorithm d. Clinician judgment and/or patient preferences may indicate treatment for people with 10-year fracture probabilities above or below these levels FOLLOW-UP: People with diagnosed cases of osteoporosis or at high risk for fracture should have regular bone mineral density tests. For patients eligible for Medicare, routine testing is allowed once every 2 years. The testing frequency can be increased to one year for patients who have rapidly progressing disease, those who are receiving or discontinuing medical therapy to restore bone mass, or have additional risk factors. I have reviewed this report and agree with the above findings. [facility] FRAX* 10-year Probability of Fracture Based on femoral neck BMD: DualFemur (Left) Major Osteoporotic Fracture: 3.9% Hip Fracture:                0.4% Population:                  [place] ([place]) Risk Factors:  None *FRAX is a Materials engineer of the [employer] for Metabolic Bone Disease, a [place] (WHO) [facility]. ASSESSMENT: The probability of a major osteoporotic fracture is 3.9 % within the next ten years. The probability of hip fracture is  0.4  % within the next 10 years. Electronically Signed   By: Margarette [place] M.D.   On: 11/13/2018 18:09   Dg Hip Port Unilat With Pelvis 1v Left  Result Date: 11/28/2018 CLINICAL DATA:  Left hip replacement. EXAM: DG HIP (WITH OR WITHOUT PELVIS) 1V PORT LEFT COMPARISON:  02/08/2018. FINDINGS: Total left hip replacement. Hardware intact. Anatomic alignment. Degenerative change right hip. IMPRESSION: Total left hip replacement with anatomic alignment. Electronically Signed   By: [place]   On: 11/28/2018 10:50     Examination:  General appearance: alert, cooperative and no distress  Wound Exam: clean, dry, intact   Drainage:  None: wound tissue dry  Motor Exam: EHL, FHL, Anterior Tibial and Posterior Tibial Intact  Sensory Exam: Superficial Peroneal, Deep Peroneal and Tibial normal  Vascular Exam: Normal  Assessment:    1 Day Post-Op  Procedure(s) (LRB): TOTAL HIP ARTHROPLASTY (Left)  ADDITIONAL DIAGNOSIS:  Active Problems:   Unilateral primary osteoarthritis, left hip   Status post THR (total hip replacement)  Acute Blood Loss Anemia-asymptomatic-monitor   Plan: Physical Therapy as ordered Weight Bearing as Tolerated (WBAT)  DVT Prophylaxis:  Aspirin, Foot Pumps and TED hose  DISCHARGE PLAN: Home  DISCHARGE NEEDS: HHPT, Walker and 3-in-1 comode seat   OOB with PT, D/C foley, hope for Discharge tomorrow,saline lock IV     Biagio Borg, PA-C [facility]  11/29/2018 7:37 AM

## 2018-11-30 LAB — CBC
HCT: 33.8 % — ABNORMAL LOW (ref 36.0–46.0)
Hemoglobin: 9.7 g/dL — ABNORMAL LOW (ref 12.0–15.0)
MCH: 20 pg — AB (ref 26.0–34.0)
MCHC: 28.7 g/dL — AB (ref 30.0–36.0)
MCV: 69.7 fL — ABNORMAL LOW (ref 80.0–100.0)
Platelets: 258 10*3/uL (ref 150–400)
RBC: 4.85 MIL/uL (ref 3.87–5.11)
RDW: 16.2 % — ABNORMAL HIGH (ref 11.5–15.5)
WBC: 11 10*3/uL — ABNORMAL HIGH (ref 4.0–10.5)
nRBC: 0 % (ref 0.0–0.2)

## 2018-11-30 LAB — BASIC METABOLIC PANEL
Anion gap: 4 — ABNORMAL LOW (ref 5–15)
BUN: 11 mg/dL (ref 8–23)
CO2: 28 mmol/L (ref 22–32)
Calcium: 8.4 mg/dL — ABNORMAL LOW (ref 8.9–10.3)
Chloride: 106 mmol/L (ref 98–111)
Creatinine, Ser: 1.02 mg/dL — ABNORMAL HIGH (ref 0.44–1.00)
GFR calc Af Amer: >60 mL/min (ref >60)
GFR calc non Af Amer: 57 mL/min — ABNORMAL LOW (ref >60)
Glucose, Bld: 98 mg/dL (ref 70–99)
Potassium: 4 mmol/L (ref 3.5–5.1)
Sodium: 138 mmol/L (ref 135–145)

## 2018-11-30 LAB — GLUCOSE, CAPILLARY
Glucose-Capillary: 89 mg/dL (ref 70–99)
Glucose-Capillary: 91 mg/dL (ref 70–99)

## 2018-11-30 MED ORDER — ACETAMINOPHEN 325 MG PO TABS: 650.0000 mg | ORAL_TABLET | Freq: Four times a day (QID) | ORAL | Status: AC

## 2018-11-30 MED ORDER — ASPIRIN 325 MG PO TBEC: 325.0000 mg | DELAYED_RELEASE_TABLET | Freq: Every day | ORAL | 0 refills | Status: DC

## 2018-11-30 MED ORDER — METHOCARBAMOL 500 MG PO TABS: 500.0000 mg | ORAL_TABLET | Freq: Three times a day (TID) | ORAL | 1 refills | Status: DC | PRN

## 2018-11-30 MED ORDER — OXYCODONE HCL 5 MG PO TABS: 5.0000 mg | ORAL_TABLET | ORAL | 0 refills | Status: DC | PRN

## 2018-11-30 MED FILL — oxyCODONE HCL 5 MG TABS: 5 | 7 days supply | Qty: 80 | Fill #0

## 2018-11-30 MED FILL — METHOCARBAMOL 500 MG TABS: 500 | 10 days supply | Qty: 30 | Fill #0

## 2018-11-30 NOTE — Progress Notes (Signed)
Physical Therapy Treatment Patient Details Name: Barbara Adkins MRN: [id number] DOB: 23-Apr-1951 Today's Date: 11/30/2018    History of Present Illness Patient is 68 y/o female s/p L THA, posterior approach. PMH consits of HTN, dyslipidema, and prediabetes    PT Comments    Patient is making good progress with PT.  From a mobility standpoint anticipate patient will be ready for DC home when medically ready.    Follow Up Recommendations  Follow surgeon's recommendation for DC plan and follow-up therapies;Supervision for mobility/OOB     Equipment Recommendations  3in1 (PT);Rolling walker with 5" wheels    Recommendations for Other Services OT consult     Precautions / Restrictions Precautions Precautions: Posterior Hip Precaution Booklet Issued: Yes (comment) Precaution Comments: pt recalls 3/3 precautions Required Braces or Orthoses: Knee Immobilizer - Left Knee Immobilizer - Left: Other (comment)(when in bed) Restrictions Weight Bearing Restrictions: Yes LLE Weight Bearing: Weight bearing as tolerated    Mobility  Bed Mobility Overal bed mobility: Needs Assistance Bed Mobility: Supine to Sit;Sit to Supine     Supine to sit: Supervision Sit to supine: Supervision   General bed mobility comments: increased time and effort needed; no physical assist needed  Transfers Overall transfer level: Needs assistance Equipment used: Rolling walker (2 wheeled) Transfers: Sit to/from Stand Sit to Stand: Supervision         General transfer comment: safe hand placement demonstrated  Ambulation/Gait Ambulation/Gait assistance: Supervision Gait Distance (Feet): 225 Feet Assistive device: Rolling walker (2 wheeled) Gait Pattern/deviations: Step-through pattern;Decreased weight shift to left;Decreased stride length Gait velocity: decreased   General Gait Details: continues to demonstrate more fluid movement with gait; cues for posture at times   Stairs             Wheelchair Mobility    Modified Rankin (Stroke Patients Only)       Balance Overall balance assessment: Needs assistance Sitting-balance support: No upper extremity supported;Feet supported Sitting balance-Leahy Scale: Good     Standing balance support: Bilateral upper extremity supported;During functional activity Standing balance-Leahy Scale: Poor Standing balance comment: pt able to static stand with single UE support                            Cognition Arousal/Alertness: Awake/alert Behavior During Therapy: WFL for tasks assessed/performed Overall Cognitive Status: Within Functional Limits for tasks assessed                                        Exercises Total Joint Exercises Hip ABduction/ADduction: Strengthening;Left;10 reps;Standing Knee Flexion: AROM;Strengthening;Left;10 reps;Standing Marching in Standing: AROM;Strengthening;Left;10 reps;Standing Standing Hip Extension: AROM;Strengthening;Left;10 reps;Standing    General Comments        Pertinent Vitals/Pain Pain Assessment: Faces Faces Pain Scale: Hurts even more Pain Location: L inner thigh/groin with therex Pain Descriptors / Indicators: Cramping Pain Intervention(s): Monitored during session;Premedicated before session;Repositioned    Home Living                      Prior Function            PT Goals (current goals can now be found in the care plan section) Acute Rehab PT Goals Patient Stated Goal: to go home Progress towards PT goals: Progressing toward goals    Frequency    7X/week      PT Plan  Current plan remains appropriate    Co-evaluation              AM-PAC PT "6 Clicks" Mobility   Outcome Measure  Help needed turning from your back to your side while in a flat bed without using bedrails?: A Little Help needed moving from lying on your back to sitting on the side of a flat bed without using bedrails?: A Little Help needed  moving to and from a bed to a chair (including a wheelchair)?: A Little Help needed standing up from a chair using your arms (e.g., wheelchair or bedside chair)?: A Little Help needed to walk in hospital room?: A Little Help needed climbing 3-5 steps with a railing? : A Little 6 Click Score: 18    End of Session Equipment Utilized During Treatment: Gait belt Activity Tolerance: Patient tolerated treatment well Patient left: with call bell/phone within reach;in bed Nurse Communication: Mobility status PT Visit Diagnosis: Difficulty in walking, not elsewhere classified (R26.2);Pain;Muscle weakness (generalized) (M62.81) Pain - Right/Left: Left Pain - part of body: Hip     Time: 1310-1346 PT Time Calculation (min) (ACUTE ONLY): 36 min  Charges:  $Gait Training: 8-22 mins $Therapeutic Exercise: 8-22 mins                     Earney Navy, PTA Acute Rehabilitation Services Pager: [phone] Office: [phone]     Darliss Cheney 11/30/2018, 2:10 PM

## 2018-11-30 NOTE — Progress Notes (Signed)
Subjective: 2 Days Post-Op Procedure(s) (LRB): TOTAL HIP ARTHROPLASTY (Left) Patient reports pain as mild and moderate.    Objective: Vital signs in last 24 hours: Temp:  [98.2 F (36.8 C)-100.1 F (37.8 C)] 99.6 F (37.6 C) (01/16 0704) Pulse Rate:  [67-86] 86 (01/16 0423) Resp:  [20-24] 20 (01/16 0423) BP: (126-131)/(74-78) 131/78 (01/16 0423) SpO2:  [96 %-99 %] 96 % (01/16 0423)  Intake/Output from previous day: 01/15 0701 - 01/16 0700 In: 625 [P.O.:625] Out: -  Intake/Output this shift: Total I/O In: 360 [P.O.:360] Out: -   Recent Labs    11/29/18 0146 11/30/18 0213  HGB 9.4* 9.7*   Recent Labs    11/29/18 0146 11/30/18 0213  WBC 9.4 11.0*  RBC 4.52 4.85  HCT 31.8* 33.8*  PLT 292 258   Recent Labs    11/29/18 0146 11/30/18 0213  NA 137 138  K 4.1 4.0  CL 104 106  CO2 26 28  BUN 11 11  CREATININE 0.96 1.02*  GLUCOSE 103* 98  CALCIUM 8.3* 8.4*   No results for input(s): LABPT, INR in the last 72 hours. Awake, alert, comfortable No calf pain, voiding without difficulty, leg lengths equal Neuro intact Dressing left hip changed-wound clean and dry Assessment/Plan: 2 Days Post-Op Procedure(s) (LRB): TOTAL HIP ARTHROPLASTY (Left) Up with therapy Discharge home with home health    Garald Balding 11/30/2018, 9:56 AM

## 2018-11-30 NOTE — Discharge Summary (Signed)
Joni Fears, MD   Biagio Borg, PA-C [address] [place], [place]  [address]                             [phone]  PATIENT ID: Barbara Adkins        MRN:  [id number]          DOB/AGE: 06-17-1951 / 68 y.o.    DISCHARGE SUMMARY  ADMISSION DATE:    11/28/2018 DISCHARGE DATE:   11/30/2018   ADMISSION DIAGNOSIS: LEFT HIP OSTEOARTHRITIS    DISCHARGE DIAGNOSIS:  LEFT HIP OSTEOARTHRITIS    ADDITIONAL DIAGNOSIS: Active Problems:   Unilateral primary osteoarthritis, left hip   Status post THR (total hip replacement)  Past Medical History:  Diagnosis Date  . Allergy   . Arthritis   . Deviated septum   . Diabetes mellitus without complication ([place])   . GERD (gastroesophageal reflux disease)   . Hypertension     PROCEDURE: Procedure(s): TOTAL HIP ARTHROPLASTY  on 11/28/2018  CONSULTS: none   HISTORY: See H&P in chart  HOSPITAL COURSE:  Barbara Adkins is a 68 y.o. admitted on 11/28/2018 and found to have a diagnosis of [place].  After appropriate laboratory studies were obtained  they were taken to the operating room on 11/28/2018 and underwent  Procedure(s): TOTAL HIP ARTHROPLASTY    They were given perioperative antibiotics:  Anti-infectives (From admission, onward)   Start     Dose/Rate Route Frequency Ordered Stop   11/28/18 1253  ceFAZolin (ANCEF) 1-4 GM/50ML-% IVPB    Note to Pharmacy:  Kendell Bane   : cabinet override      11/28/18 1253 11/28/18 1649   11/28/18 1000  ceFAZolin (ANCEF) IVPB 1 g/50 mL premix     1 g 100 mL/hr over 30 Minutes Intravenous Every 6 hours 11/28/18 0959 11/28/18 2107   11/28/18 0600  ceFAZolin (ANCEF) IVPB 2g/100 mL premix     2 g 200 mL/hr over 30 Minutes Intravenous On call to O.R. 11/28/18 0867 11/28/18 0732    .  Tolerated the procedure well.  Placed with a foley intraoperatively.     POD #1, allowed out of bed to a chair.  PT for ambulation and exercise program.  Foley D/C'd in morning.  IV  saline locked.  O2 discontionued.  POD #2, continued PT and ambulation.  Good effort and independent with walker .  The remainder of the hospital course was dedicated to ambulation and strengthening.   The patient was discharged on 2 Days Post-Op in  Good condition.  Blood products given:none  DIAGNOSTIC STUDIES: Recent vital signs:  Patient Vitals for the past 24 hrs:  BP Temp Temp src Pulse Resp SpO2  11/30/18 0704 - 99.6 F (37.6 C) Oral - - -  11/30/18 0423 131/78 100.1 F (37.8 C) Oral 86 20 96 %  11/29/18 2021 126/74 99.5 F (37.5 C) Oral 79 (!) 24 96 %  11/29/18 1327 129/76 98.2 F (36.8 C) Oral 67 - 99 %       Recent laboratory studies: Recent Labs    11/29/18 0146 11/30/18 0213  WBC 9.4 11.0*  HGB 9.4* 9.7*  HCT 31.8* 33.8*  PLT 292 258   Recent Labs    11/29/18 0146 11/30/18 0213  NA 137 138  K 4.1 4.0  CL 104 106  CO2 26 28  BUN 11 11  CREATININE 0.96 1.02*  GLUCOSE 103* 98  CALCIUM 8.3* 8.4*   Lab Results  Component Value Date   INR 1.04 11/20/2018     Recent Radiographic Studies :  Dg Chest 2 View  Result Date: 11/20/2018 CLINICAL DATA:  68 year old female with a history of preoperative testing for hip arthroplasty EXAM: CHEST - 2 VIEW COMPARISON:  10/07/2012 FINDINGS: The heart size and mediastinal contours are within normal limits. Both lungs are clear. Mild scoliotic curvature of the thoracolumbar spine. IMPRESSION: Negative for acute cardiopulmonary disease. Electronically Signed   By: Corrie Mckusick D.O.   On: 11/20/2018 09:46   Dg Bone Density  Result Date: 11/13/2018 EXAM: DUAL X-RAY ABSORPTIOMETRY (DXA) FOR BONE MINERAL DENSITY IMPRESSION: Referring Physician:  Forrest Moron Your patient completed a BMD test using Lunar IDXA DXA system ( analysis version: 16 ) manufactured by [employer]. Technologist: WLS PATIENT: Name: Barbara Adkins, Barbara Adkins Patient ID: [id number] Birth Date: [birth year] Height: 62.0 in. Sex: Female Measured: 11/13/2018  Weight: 186.6 lbs. Indications: Estrogen Deficient, Height Loss (781.91), Low Calcium Intake (269.3), Omeprazole, Postmenopausal Fractures: None Treatments: None ASSESSMENT: The BMD measured at Femur Neck Left is 0.841 g/cm2 with a T-score of -1.4. This patient's diagnostic category is LOW BONE MASS/OSTEOPENIA according to [facility]) criteria. The scan quality is good. L-4 was excluded due to degenerative changes. Site Region Measured Date Measured Age YA BMD Significant CHANGE T-score DualFemur Neck Left  11[age]13/2018    [age]         -1.4    0.841 g/cm2 AP Spine  L1-L3      11[age]13/2018    [age]         -0.8    1.074 g/cm2 DualFemur Total Mean 11/13/2018    67.6         -1.0    0.879 g/cm2 World Health Organization [facility]) criteria for post-menopausal, Caucasian Women: Normal       T-score at or above -1 SD Osteopenia   T-score between -1 and -2.5 SD Osteoporosis T-score at or below -2.5 SD RECOMMENDATION: 1. All patients should optimize calcium and vitamin D intake. 2. Consider FDA approved medical therapies in postmenopausal women and men aged 29 years and older, based on the following: a. A hip or vertebral (clinical or morphometric) fracture b. T- score < or = -2.5 at the femoral neck or spine after appropriate evaluation to exclude secondary causes c. Low bone mass (T-score between -1.0 and -2.5 at the femoral neck or spine) and a 10 year probability of a hip fracture > or = 3% or a 10 year probability of a major osteoporosis-related fracture > or = 20% based on the [place]-adapted WHO algorithm d. Clinician judgment and/or patient preferences may indicate treatment for people with 10-year fracture probabilities above or below these levels FOLLOW-UP: People with diagnosed cases of osteoporosis or at high risk for fracture should have regular bone mineral density tests. For patients eligible for Medicare, routine testing is allowed once every 2 years. The testing frequency can be increased to one year  for patients who have rapidly progressing disease, those who are receiving or discontinuing medical therapy to restore bone mass, or have additional risk factors. I have reviewed this report and agree with the above findings. [facility] FRAX* 10-year Probability of Fracture Based on femoral neck BMD: DualFemur (Left) Major Osteoporotic Fracture: 3.9% Hip Fracture:                0.4% Population:  [place] ([place]) Risk Factors:                None *FRAX is a Materials engineer of the [employer] for Metabolic Bone Disease, a [place] (WHO) [facility]. ASSESSMENT: The probability of a major osteoporotic fracture is 3.9 % within the next ten years. The probability of hip fracture is  0.4  % within the next 10 years. Electronically Signed   By: Margarette [place] M.D.   On: 11/13/2018 18:09   Dg Hip Port Unilat With Pelvis 1v Left  Result Date: 11/28/2018 CLINICAL DATA:  Left hip replacement. EXAM: DG HIP (WITH OR WITHOUT PELVIS) 1V PORT LEFT COMPARISON:  02/08/2018. FINDINGS: Total left hip replacement. Hardware intact. Anatomic alignment. Degenerative change right hip. IMPRESSION: Total left hip replacement with anatomic alignment. Electronically Signed   By: Marcello Moores  Register   On: 11/28/2018 10:50    DISCHARGE INSTRUCTIONS: Discharge Instructions    Call MD / Call 911   Complete by:  As directed    If you experience chest pain or shortness of breath, CALL 911 and be transported to the hospital emergency room.  If you develope a fever above 101 F, pus (white drainage) or increased drainage or redness at the wound, or calf pain, call your surgeon's office.   Change dressing   Complete by:  As directed    Do not change dressing   Constipation Prevention   Complete by:  As directed    Drink plenty of fluids.  Prune juice may be helpful.  You may use a stool softener, such as Colace (over the counter) 100 mg twice a day.  Use MiraLax  (over the counter) for constipation as needed.   Diet general   Complete by:  As directed    Discharge instructions   Complete by:  As directed    [place] items at home which could result in a fall. This includes throw rugs or furniture in walking pathways ICE to the affected joint every three hours while awake for 30 minutes at a time, for at least the first 3-5 days, and then as needed for pain and swelling.  Continue to use ice for pain and swelling. You may notice swelling that will progress down to the foot and ankle.  This is normal after surgery.  Elevate your leg when you are not up walking on it.   Continue to use the breathing machine you got in the hospital (incentive spirometer) which will help keep your temperature down.  It is common for your temperature to cycle up and down following surgery, especially at night when you are not up moving around and exerting yourself.  The breathing machine keeps your lungs expanded and your temperature down.   DIET:  As you were doing prior to hospitalization, we recommend a well-balanced diet.  DRESSING / WOUND CARE / SHOWERING  Keep the surgical dressing until follow up.  The dressing is water proof, so you can shower without any extra covering.  IF THE DRESSING FALLS OFF or the wound gets wet inside, change the dressing with sterile gauze.  Please use good hand washing techniques before changing the dressing.  Do not use any lotions or creams on the incision until instructed by your surgeon.    ACTIVITY  Increase activity slowly as tolerated, but follow the weight bearing instructions below.   No driving for 6 weeks or until further direction given by  your physician.  You cannot drive while taking narcotics.  No lifting or carrying greater than 10 lbs. until further directed by your surgeon. Avoid periods of inactivity such as sitting longer than an hour when not asleep. This helps prevent blood clots.    You may return to work once you are authorized by your doctor.     WEIGHT BEARING   Weight bearing as tolerated with assist device (walker, cane, etc) as directed, use it as long as suggested by your surgeon or therapist, typically at least 4-6 weeks.   EXERCISES  Results after joint replacement surgery are often greatly improved when you follow the exercise, range of motion and muscle strengthening exercises prescribed by your doctor. Safety measures are also important to protect the joint from further injury. Any time any of these exercises cause you to have increased pain or swelling, decrease what you are doing until you are comfortable again and then slowly increase them. If you have problems or questions, call your caregiver or physical therapist for advice.   Rehabilitation is important following a joint replacement. After just a few days of immobilization, the muscles of the leg can become weakened and shrink (atrophy).  These exercises are designed to build up the tone and strength of the thigh and leg muscles and to improve motion. Often times heat used for twenty to thirty minutes before working out will loosen up your tissues and help with improving the range of motion but do not use heat for the first two weeks following surgery (sometimes heat can increase post-operative swelling).   These exercises can be done on a training (exercise) mat, on the floor, on a table or on a bed. Use whatever works the best and is most comfortable for you.    Use music or television while you are exercising so that the exercises are a pleasant break in your day. This will make your life better with the exercises acting as a break in your routine that you can look forward to.   Perform all exercises about fifteen times, three times per day or as directed.  You should exercise both the operative leg and the other leg as well.   Exercises include:  Quad Sets - Tighten up the muscle on the front of the  thigh (Quad) and hold for 5-10 seconds.   Straight Leg Raises - With your knee straight (if you were given a brace, keep it on), lift the leg to 60 degrees, hold for 3 seconds, and slowly lower the leg.  Perform this exercise against resistance later as your leg gets stronger.  Leg Slides: Lying on your back, slowly slide your foot toward your buttocks, bending your knee up off the floor (only go as far as is comfortable). Then slowly slide your foot back down until your leg is flat on the floor again.  Angel Wings: Lying on your back spread your legs to the side as far apart as you can without causing discomfort.  Hamstring Strength:  Lying on your back, push your heel against the floor with your leg straight by tightening up the muscles of your buttocks.  Repeat, but this time bend your knee to a comfortable angle, and push your heel against the floor.  You may put a pillow under the heel to make it more comfortable if necessary.   A rehabilitation program following joint replacement surgery can speed recovery and prevent re-injury in the future due to weakened muscles. Contact your doctor or  a physical therapist for more information on knee rehabilitation.    CONSTIPATION  Constipation is defined medically as fewer than three stools per week and severe constipation as less than one stool per week.  Even if you have a regular bowel pattern at home, your normal regimen is likely to be disrupted due to multiple reasons following surgery.  Combination of anesthesia, postoperative narcotics, change in appetite and fluid intake all can affect your bowels.   YOU MUST use at least one of the following options; they are listed in order of increasing strength to get the job done.  They are all available over the counter, and you may need to use some, POSSIBLY even all of these options:    Drink plenty of fluids (prune juice may be helpful) and high fiber foods Colace 100 mg by mouth twice a day  Senokot  for constipation as directed and as needed Dulcolax (bisacodyl), take with full glass of water  Miralax (polyethylene glycol) once or twice a day as needed.  If you have tried all these things and are unable to have a bowel movement in the first 3-4 days after surgery call either your surgeon or your primary doctor.    If you experience loose stools or diarrhea, hold the medications until you stool forms back up.  If your symptoms do not get better within 1 week or if they get worse, check with your doctor.  If you experience "the worst abdominal pain ever" or develop nausea or vomiting, please contact the office immediately for further recommendations for treatment.   ITCHING:  If you experience itching with your medications, try taking only a single pain pill, or even half a pain pill at a time.  You can also use Benadryl over the counter for itching or also to help with sleep.   TED HOSE STOCKINGS:  Use stockings on both legs until for at least 2 weeks or as directed by physician office. They may be removed at night for sleeping.  MEDICATIONS:  See your medication summary on the "After Visit Summary" that nursing will review with you.  You may have some home medications which will be placed on hold until you complete the course of blood thinner medication.  It is important for you to complete the blood thinner medication as prescribed.  PRECAUTIONS:  If you experience chest pain or shortness of breath - call 911 immediately for transfer to the hospital emergency department.   If you develop a fever greater that 101 F, purulent drainage from wound, increased redness or drainage from wound, foul odor from the wound/dressing, or calf pain - CONTACT YOUR SURGEON.                                                   FOLLOW-UP APPOINTMENTS:  If you do not already have a post-op appointment, please call the office for an appointment to be seen by your surgeon.  Guidelines for how soon to be seen are  listed in your "After Visit Summary", but are typically between 1-4 weeks after surgery.  OTHER INSTRUCTIONS:   Knee Replacement:  Do not place pillow under knee, focus on keeping the knee straight while resting. CPM instructions: 0-90 degrees, 2 hours in the morning, 2 hours in the afternoon, and 2 hours in the evening. Place foam block, curve  side up under heel at all times except when in CPM or when walking.  DO NOT modify, tear, cut, or change the foam block in any way.  MAKE SURE YOU:  Understand these instructions.  Get help right away if you are not doing well or get worse.    Thank you for letting us be a part of your medical care team.  It is a privilege we respect greatly.  We hope these instructions will help you stay on track for a fast and full recovery!   Driving restrictions   Complete by:  As directed    No driving for6 weeks   Follow the hip precautions as taught in Physical Therapy   Complete by:  As directed    Increase activity slowly as tolerated   Complete by:  As directed    Lifting restrictions   Complete by:  As directed    No lifting for 6 weeks   Patient may shower   Complete by:  As directed    You may shower over the brown dressing. Do not remove the dressing-will be changed on first office visit   TED hose   Complete by:  As directed    Use stockings (TED hose) for4weeks onright leg(s).  You may remove them at night for sleeping.   Weight bearing as tolerated   Complete by:  As directed    Laterality:  right   Extremity:  Lower      DISCHARGE MEDICATIONS:   Allergies as of 11/30/2018   No Known Allergies     Medication List    STOP taking these medications   minoxidil 2 % external solution Commonly known as:  ROGAINE     TAKE these medications   acetaminophen 325 MG tablet Commonly known as:  TYLENOL Take 2 tablets (650 mg total) by mouth every 6 (six) hours.   albuterol 108 (90 Base) MCG/ACT inhaler Commonly known as:  PROVENTIL  HFA;VENTOLIN HFA Inhale 2 puffs into the lungs every 6 (six) hours as needed for wheezing or shortness of breath.   aspirin 325 MG EC tablet Take 1 tablet (325 mg total) by mouth daily with breakfast. Start taking on:  December 01, 2018   atorvastatin 20 MG tablet Commonly known as:  LIPITOR TAKE 1 TABLET (20 MG TOTAL) BY MOUTH DAILY.   cetirizine 10 MG tablet Commonly known as:  ZYRTEC Take 1 tablet (10 mg total) by mouth daily.   losartan 50 MG tablet Commonly known as:  COZAAR Take 1.5 tablets (75 mg total) by mouth daily.   metFORMIN 500 MG tablet Commonly known as:  GLUCOPHAGE TAKE 1 TABLET (500 MG TOTAL) BY MOUTH DAILY WITH BREAKFAST.   methocarbamol 500 MG tablet Commonly known as:  ROBAXIN Take 1 tablet (500 mg total) by mouth every 8 (eight) hours as needed for muscle spasms.   omeprazole 20 MG capsule Commonly known as:  PRILOSEC TAKE 1 TABLET BY MOUTH DAILY. What changed:  how much to take   oxyCODONE 5 MG immediate release tablet Commonly known as:  Oxy IR/ROXICODONE Take 1-2 tablets (5-10 mg total) by mouth every 4 (four) hours as needed for moderate pain (pain score 4-6).            Durable Medical Equipment  (From admission, onward)         Start     Ordered   11/28/18 1538  DME Walker rolling  Once    Question:  Patient needs a walker  to treat with the following condition  Answer:  S/P total hip arthroplasty   11/28/18 1537   11/28/18 1538  DME 3 n 1  Once     11/28/18 1537   11/28/18 1538  DME Bedside commode  Once    Question:  Patient needs a bedside commode to treat with the following condition  Answer:  Status post THR (total hip replacement)   11/28/18 1537           Discharge Care Instructions  (From admission, onward)         Start     Ordered   11/30/18 0000  Weight bearing as tolerated    Question Answer Comment  Laterality right   Extremity Lower      11/30/18 1005   11/30/18 0000  Change dressing    Comments:  Do not  change dressing   11/30/18 1005          FOLLOW UP VISIT:   Follow-up Information    [facility] Follow up.   Specialty:  Home Health Services Why:  for home health care. they will call in the next 1-2 days to set up your first home visit Contact information: [address] [place] [address] [phone]           DISPOSITION:   Home  CONDITION:  Stable-no related SOB or chest pain. No calf discomfort. Left hip wound clean and dry-dressing replaced with mepilex   Joni Fears, MD [facility] [phone]  11/30/2018 10:05 AM

## 2018-11-30 NOTE — Progress Notes (Signed)
Occupational Therapy Treatment Patient Details Name: Barbara Adkins MRN: [id number] DOB: Apr 21, 1951 Today's Date: 11/30/2018    History of present illness Patient is 68 y/o female s/p L THA, posterior approach. PMH consits of HTN, dyslipidema, and prediabetes   OT comments  Pt progressing towards OT goals this session. Reinforced compensatory strategies for LB ADL maintaining posterior hip precautions (Pt able to recall 3/3), also able to perform shower transfer, toilet transfer, and sink level grooming at supervision level. OT will continue to follow in the acute and home health settings to maximize safety and independence in ADL and functional transfers.    Follow Up Recommendations  Home health OT    Equipment Recommendations  3 in 1 bedside commode    Recommendations for Other Services      Precautions / Restrictions Precautions Precautions: Posterior Hip Precaution Booklet Issued: Yes (comment) Precaution Comments: pt recalls 3/3 precautions Required Braces or Orthoses: Knee Immobilizer - Left Knee Immobilizer - Left: Other (comment)(when in bed) Restrictions Weight Bearing Restrictions: Yes LLE Weight Bearing: Weight bearing as tolerated       Mobility Bed Mobility Overal bed mobility: Needs Assistance Bed Mobility: Supine to Sit;Sit to Supine     Supine to sit: Supervision Sit to supine: Supervision   General bed mobility comments: increased time and effort needed; no physical assist needed  Transfers Overall transfer level: Needs assistance Equipment used: Rolling walker (2 wheeled) Transfers: Sit to/from Stand Sit to Stand: Supervision         General transfer comment: safe hand placement demonstrated    Balance Overall balance assessment: Needs assistance Sitting-balance support: No upper extremity supported;Feet supported Sitting balance-Leahy Scale: Good     Standing balance support: Bilateral upper extremity supported;During functional  activity;No upper extremity supported Standing balance-Leahy Scale: Fair Standing balance comment: pt able to static stand without UE support at sink                           ADL either performed or assessed with clinical judgement   ADL Overall ADL's : Needs assistance/impaired     Grooming: Wash/dry hands;Supervision/safety;Standing Grooming Details (indicate cue type and reason): sink level                 Toilet Transfer: Supervision/safety;Ambulation;RW;Comfort height toilet;Grab bars;BSC Toilet Transfer Details (indicate cue type and reason): BSC over toilet Toileting- Clothing Manipulation and Hygiene: Modified independent;Sit to/from stand Toileting - Clothing Manipulation Details (indicate cue type and reason): able to manage gowns, underwear, and peri care Tub/ Shower Transfer: Walk-in shower;Min guard;Ambulation;Rolling walker   Functional mobility during ADLs: Supervision/safety;Rolling walker General ADL Comments: Pt edcuated on shower transfer as well as reinforced compensatory strategies for LB ADL     Vision       Perception     Praxis      Cognition Arousal/Alertness: Awake/alert Behavior During Therapy: WFL for tasks assessed/performed Overall Cognitive Status: Within Functional Limits for tasks assessed                                          Exercises Exercises: Total Joint Total Joint Exercises Hip ABduction/ADduction: Strengthening;Left;10 reps;Standing Knee Flexion: AROM;Strengthening;Left;10 reps;Standing Marching in Standing: AROM;Strengthening;Left;10 reps;Standing Standing Hip Extension: AROM;Strengthening;Left;10 reps;Standing   Shoulder Instructions       General Comments      Pertinent Vitals/ Pain  Pain Assessment: Faces Faces Pain Scale: Hurts little more Pain Location: L hip initially with movement Pain Descriptors / Indicators: Cramping Pain Intervention(s): Monitored during  session;Repositioned  Home Living                                          Prior Functioning/Environment              Frequency  Min 2X/week        Progress Toward Goals  OT Goals(current goals can now be found in the care plan section)  Progress towards OT goals: Progressing toward goals  Acute Rehab OT Goals Patient Stated Goal: to go home OT Goal Formulation: With patient Time For Goal Achievement: 12/13/18 Potential to Achieve Goals: Good  Plan Discharge plan remains appropriate;Frequency remains appropriate    Co-evaluation                 AM-PAC OT "6 Clicks" Daily Activity     Outcome Measure   Help from another person eating meals?: None Help from another person taking care of personal grooming?: None Help from another person toileting, which includes using toliet, bedpan, or urinal?: A Little Help from another person bathing (including washing, rinsing, drying)?: A Little Help from another person to put on and taking off regular upper body clothing?: None Help from another person to put on and taking off regular lower body clothing?: A Little 6 Click Score: 21    End of Session Equipment Utilized During Treatment: Gait belt;Rolling walker  OT Visit Diagnosis: Other abnormalities of gait and mobility (R26.89)   Activity Tolerance Patient tolerated treatment well   Patient Left in bed;with call bell/phone within reach   Nurse Communication Mobility status;Precautions;Weight bearing status        Time: 5374-8270 OT Time Calculation (min): 26 min  Charges: OT General Charges $OT Visit: 1 Visit OT Treatments $Self Care/Home Management : 23-37 mins  Hulda Humphrey OTR/L Acute Rehabilitation Services Pager: [phone] Office: [place] 11/30/2018, 4:14 PM

## 2018-11-30 NOTE — Progress Notes (Signed)
Physical Therapy Treatment Patient Details Name: Barbara Adkins MRN: [id number] DOB: 1951/10/04 Today's Date: 11/30/2018    History of Present Illness Patient is 68 y/o female s/p L THA, posterior approach. PMH consits of HTN, dyslipidema, and prediabetes    PT Comments    Patient is mobilizing well with c/o more soreness today but not more than expected.  Pt tolerated gait training and completion of half of HEP. Review rest of therex next session.   Follow Up Recommendations  Follow surgeon's recommendation for DC plan and follow-up therapies;Supervision for mobility/OOB     Equipment Recommendations  3in1 (PT);Rolling walker with 5" wheels    Recommendations for Other Services OT consult     Precautions / Restrictions Precautions Precautions: Posterior Hip Precaution Booklet Issued: Yes (comment) Precaution Comments: pt recalls 3/3 precautions Required Braces or Orthoses: Knee Immobilizer - Left Knee Immobilizer - Left: Other (comment)(when in bed) Restrictions Weight Bearing Restrictions: Yes LLE Weight Bearing: Weight bearing as tolerated    Mobility  Bed Mobility Overal bed mobility: Needs Assistance Bed Mobility: Supine to Sit     Supine to sit: Supervision     General bed mobility comments: increased time and effort needed; use of rails; supervision for safety  Transfers Overall transfer level: Needs assistance Equipment used: Rolling walker (2 wheeled) Transfers: Sit to/from Stand Sit to Stand: Supervision         General transfer comment: safe hand placement demonstrated  Ambulation/Gait Ambulation/Gait assistance: Min guard;Supervision Gait Distance (Feet): 160 Feet Assistive device: Rolling walker (2 wheeled) Gait Pattern/deviations: Step-through pattern;Decreased weight shift to left;Decreased stride length Gait velocity: decreased   General Gait Details: cues for posture and increased stride length   Stairs              Wheelchair Mobility    Modified Rankin (Stroke Patients Only)       Balance Overall balance assessment: Needs assistance Sitting-balance support: No upper extremity supported;Feet supported Sitting balance-Leahy Scale: Good     Standing balance support: Bilateral upper extremity supported;During functional activity Standing balance-Leahy Scale: Poor Standing balance comment: pt able to static stand with single UE support                            Cognition Arousal/Alertness: Awake/alert Behavior During Therapy: WFL for tasks assessed/performed Overall Cognitive Status: Within Functional Limits for tasks assessed                                        Exercises Total Joint Exercises Quad Sets: Both;10 reps Short Arc Quad: Left;10 reps Heel Slides: Left;10 reps Hip ABduction/ADduction: Left;10 reps    General Comments        Pertinent Vitals/Pain Pain Assessment: 0-10 Pain Score: 4  Pain Location: L hip/thigh Pain Descriptors / Indicators: Sore;Tightness Pain Intervention(s): Limited activity within patient's tolerance;Monitored during session;Premedicated before session;Repositioned    Home Living                      Prior Function            PT Goals (current goals can now be found in the care plan section) Acute Rehab PT Goals Patient Stated Goal: to go home Progress towards PT goals: Progressing toward goals    Frequency    7X/week      PT Plan Current  plan remains appropriate    Co-evaluation              AM-PAC PT "6 Clicks" Mobility   Outcome Measure  Help needed turning from your back to your side while in a flat bed without using bedrails?: A Little Help needed moving from lying on your back to sitting on the side of a flat bed without using bedrails?: A Little Help needed moving to and from a bed to a chair (including a wheelchair)?: A Little Help needed standing up from a chair using  your arms (e.g., wheelchair or bedside chair)?: A Little Help needed to walk in hospital room?: A Little Help needed climbing 3-5 steps with a railing? : A Little 6 Click Score: 18    End of Session Equipment Utilized During Treatment: Gait belt Activity Tolerance: Patient tolerated treatment well Patient left: with call bell/phone within reach;in chair Nurse Communication: Mobility status PT Visit Diagnosis: Difficulty in walking, not elsewhere classified (R26.2);Pain;Muscle weakness (generalized) (M62.81) Pain - Right/Left: Left Pain - part of body: Hip     Time: 0827-0907 PT Time Calculation (min) (ACUTE ONLY): 40 min  Charges:  $Gait Training: 8-22 mins $Therapeutic Exercise: 23-37 mins                     Earney Navy, PTA Acute Rehabilitation Services Pager: [phone] Office: [phone]     Darliss Cheney 11/30/2018, 9:13 AM

## 2018-11-30 NOTE — Plan of Care (Signed)
  Problem: Education: Goal: Knowledge of General Education information will improve Description Including pain rating scale, medication(s)/side effects and non-pharmacologic comfort measures Outcome: Progressing   Problem: Health Behavior/Discharge Planning: Goal: Ability to manage health-related needs will improve Outcome: Progressing   Problem: Activity: Goal: Risk for activity intolerance will decrease Outcome: Adequate for Discharge   Problem: Pain Managment: Goal: General experience of comfort will improve Outcome: Adequate for Discharge   Problem: Safety: Goal: Ability to remain free from injury will improve Outcome: Adequate for Discharge

## 2018-11-30 NOTE — Progress Notes (Signed)
Pt given discharge instructions and gone over with her. Answered all questions. All belongings gathered to be sent home. Ice packs given to take home.

## 2018-12-05 ENCOUNTER — Telehealth (INDEPENDENT_AMBULATORY_CARE_PROVIDER_SITE_OTHER): Payer: Self-pay | Admitting: Orthopaedic Surgery

## 2018-12-05 DIAGNOSIS — Z471 Aftercare following joint replacement surgery: Secondary | ICD-10-CM | POA: Diagnosis not present

## 2018-12-05 DIAGNOSIS — Z96642 Presence of left artificial hip joint: Secondary | ICD-10-CM | POA: Diagnosis not present

## 2018-12-05 DIAGNOSIS — E785 Hyperlipidemia, unspecified: Secondary | ICD-10-CM | POA: Diagnosis not present

## 2018-12-05 DIAGNOSIS — E119 Type 2 diabetes mellitus without complications: Secondary | ICD-10-CM | POA: Diagnosis not present

## 2018-12-05 DIAGNOSIS — Z7984 Long term (current) use of oral hypoglycemic drugs: Secondary | ICD-10-CM | POA: Diagnosis not present

## 2018-12-05 DIAGNOSIS — I10 Essential (primary) hypertension: Secondary | ICD-10-CM | POA: Diagnosis not present

## 2018-12-05 DIAGNOSIS — M1712 Unilateral primary osteoarthritis, left knee: Secondary | ICD-10-CM | POA: Diagnosis not present

## 2018-12-05 DIAGNOSIS — K219 Gastro-esophageal reflux disease without esophagitis: Secondary | ICD-10-CM | POA: Diagnosis not present

## 2018-12-05 DIAGNOSIS — Z9181 History of falling: Secondary | ICD-10-CM | POA: Diagnosis not present

## 2018-12-05 DIAGNOSIS — Z7982 Long term (current) use of aspirin: Secondary | ICD-10-CM | POA: Diagnosis not present

## 2018-12-05 DIAGNOSIS — J342 Deviated nasal septum: Secondary | ICD-10-CM | POA: Diagnosis not present

## 2018-12-05 NOTE — Telephone Encounter (Signed)
Please advise 

## 2018-12-05 NOTE — Telephone Encounter (Signed)
Gracie, physical therapist from [facility] called requesting verbal orders for patient to receive physical therapy 3 times/week for 2 weeks.  Please call back at [phone]

## 2018-12-05 NOTE — Telephone Encounter (Signed)
I called [facility] - Gracie

## 2018-12-05 NOTE — Telephone Encounter (Signed)
OK TO DO

## 2018-12-06 ENCOUNTER — Other Ambulatory Visit: Payer: Self-pay | Admitting: *Deleted

## 2018-12-06 ENCOUNTER — Ambulatory Visit: Payer: Self-pay

## 2018-12-06 ENCOUNTER — Telehealth (INDEPENDENT_AMBULATORY_CARE_PROVIDER_SITE_OTHER): Payer: Self-pay | Admitting: Orthopaedic Surgery

## 2018-12-06 DIAGNOSIS — E785 Hyperlipidemia, unspecified: Secondary | ICD-10-CM | POA: Diagnosis not present

## 2018-12-06 DIAGNOSIS — Z9181 History of falling: Secondary | ICD-10-CM | POA: Diagnosis not present

## 2018-12-06 DIAGNOSIS — K219 Gastro-esophageal reflux disease without esophagitis: Secondary | ICD-10-CM | POA: Diagnosis not present

## 2018-12-06 DIAGNOSIS — Z96642 Presence of left artificial hip joint: Secondary | ICD-10-CM | POA: Diagnosis not present

## 2018-12-06 DIAGNOSIS — E119 Type 2 diabetes mellitus without complications: Secondary | ICD-10-CM | POA: Diagnosis not present

## 2018-12-06 DIAGNOSIS — Z7982 Long term (current) use of aspirin: Secondary | ICD-10-CM | POA: Diagnosis not present

## 2018-12-06 DIAGNOSIS — I10 Essential (primary) hypertension: Secondary | ICD-10-CM | POA: Diagnosis not present

## 2018-12-06 DIAGNOSIS — J342 Deviated nasal septum: Secondary | ICD-10-CM | POA: Diagnosis not present

## 2018-12-06 DIAGNOSIS — M1712 Unilateral primary osteoarthritis, left knee: Secondary | ICD-10-CM | POA: Diagnosis not present

## 2018-12-06 DIAGNOSIS — Z7984 Long term (current) use of oral hypoglycemic drugs: Secondary | ICD-10-CM | POA: Diagnosis not present

## 2018-12-06 DIAGNOSIS — Z471 Aftercare following joint replacement surgery: Secondary | ICD-10-CM | POA: Diagnosis not present

## 2018-12-06 MED ORDER — ATORVASTATIN CALCIUM 20 MG PO TABS: 20.0000 mg | ORAL_TABLET | Freq: Every day | ORAL | 0 refills | Status: DC

## 2018-12-06 MED FILL — ATORVASTATIN CALCIUM 20 MG: 20 | 30 days supply | Qty: 30 | Fill #0

## 2018-12-06 NOTE — Telephone Encounter (Signed)
FYI

## 2018-12-06 NOTE — Telephone Encounter (Signed)
Patient has elevated BP 138/92 and HR- 80 Occupational therapy assessment- best contact- [phone]  Patient states she has been rushing around- she is sore from her therapy- she has been using her walker now.Patient does have another person coming tomorrow for therapy and will have it checked again.Patient will call to report her numbers- high or low.Protocol states patient needs to come in in 2 weeks- no appointment available with PCP- triage note sent for PCP review for provider to decide if patient needs appointment now or if she can wait and reports BP tomorrow. Reason for Disposition . [6] Systolic BP  >= 812 OR Diastolic >= 80 AND [7] taking BP medications    Patient has no way to check her BP- she states it was normal yesterday- patient is going to have it checked again tomorrow after resting- she is going call tomorrow  Answer Assessment - Initial Assessment Questions 1. BLOOD PRESSURE: "What is the blood pressure?" "Did you take at least two measurements 5 minutes apart?"     138/92- recheck was same 2. ONSET: "When did you take your blood pressure?"     12:00 3. HOW: "How did you obtain the blood pressure?" (e.g., visiting nurse, automatic home BP monitor)     manual 4. HISTORY: "Do you have a history of high blood pressure?"     History of hypertension 5. MEDICATIONS: "Are you taking any medications for blood pressure?" "Have you missed any doses recently?"     Patient is taking her medications- she is out of her cholesterol medication 6. OTHER SYMPTOMS: "Do you have any symptoms?" (e.g., headache, chest pain, blurred vision, difficulty breathing, weakness)     Patient had no symptoms 7. PREGNANCY: "Is there any chance you are pregnant?" "When was your last menstrual period?"     n/a  Protocols used: HIGH BLOOD PRESSURE-A-AH

## 2018-12-06 NOTE — Telephone Encounter (Signed)
Per pt therapist told her that she will check her BP on tomorrow 12/07/2018. She stated blood pressure on 12/05/18 was 125/78. Pt stated that she thinks that BP 138/92 today was from rushing around, and from (PT) physical therapy. She stated that she had a "faulty" walker, "I got a new one today." pt denies headache, dizziness, light-headedness, chest pain. She also, stated she's "feeling fine", hip replacement surgery was on 11/28/18. She was advised that if she start to have symptoms as discussed to go to ED. She is taking all of her medications as prescribed. She ran out of Atorvastatin 20 mg, sent 30 day supply to [facility]. Patient was advised to ask PT to check BP before exercise and 10 minutes after. She will call to report BP measurements on tomorrow to be advised of what she should do.

## 2018-12-06 NOTE — Telephone Encounter (Signed)
Malorie from [facility] called requesting verbal orders to add one more OT visit.  Please call [phone]

## 2018-12-07 ENCOUNTER — Telehealth: Payer: Self-pay | Admitting: Family Medicine

## 2018-12-07 DIAGNOSIS — J342 Deviated nasal septum: Secondary | ICD-10-CM | POA: Diagnosis not present

## 2018-12-07 DIAGNOSIS — Z7984 Long term (current) use of oral hypoglycemic drugs: Secondary | ICD-10-CM | POA: Diagnosis not present

## 2018-12-07 DIAGNOSIS — Z471 Aftercare following joint replacement surgery: Secondary | ICD-10-CM | POA: Diagnosis not present

## 2018-12-07 DIAGNOSIS — I10 Essential (primary) hypertension: Secondary | ICD-10-CM | POA: Diagnosis not present

## 2018-12-07 DIAGNOSIS — Z7982 Long term (current) use of aspirin: Secondary | ICD-10-CM | POA: Diagnosis not present

## 2018-12-07 DIAGNOSIS — E785 Hyperlipidemia, unspecified: Secondary | ICD-10-CM | POA: Diagnosis not present

## 2018-12-07 DIAGNOSIS — M1712 Unilateral primary osteoarthritis, left knee: Secondary | ICD-10-CM | POA: Diagnosis not present

## 2018-12-07 DIAGNOSIS — Z9181 History of falling: Secondary | ICD-10-CM | POA: Diagnosis not present

## 2018-12-07 DIAGNOSIS — E119 Type 2 diabetes mellitus without complications: Secondary | ICD-10-CM | POA: Diagnosis not present

## 2018-12-07 DIAGNOSIS — K219 Gastro-esophageal reflux disease without esophagitis: Secondary | ICD-10-CM | POA: Diagnosis not present

## 2018-12-07 DIAGNOSIS — Z96642 Presence of left artificial hip joint: Secondary | ICD-10-CM | POA: Diagnosis not present

## 2018-12-07 NOTE — Telephone Encounter (Signed)
Left message to advise Barbara Adkins okay to add one more OT visit

## 2018-12-07 NOTE — Telephone Encounter (Signed)
Copied from [place] [id number]. Topic: General - Other >> Dec 07, 2018 11:45 AM Keene Breath wrote: Reason for CRM: Patient called to inform the doctor of her latest BP readings.  1st reading 130/85, 2nd 138/88.  Please advise if there is a problem.  CB# [phone]

## 2018-12-07 NOTE — Telephone Encounter (Signed)
Please see note below. 

## 2018-12-07 NOTE — Telephone Encounter (Signed)
Ok to do

## 2018-12-07 NOTE — Telephone Encounter (Signed)
Let her know that her levels are in a good range. She should remain off the amlodipine. I would like to see her back in the office in 3 months.

## 2018-12-08 DIAGNOSIS — Z7984 Long term (current) use of oral hypoglycemic drugs: Secondary | ICD-10-CM | POA: Diagnosis not present

## 2018-12-08 DIAGNOSIS — E785 Hyperlipidemia, unspecified: Secondary | ICD-10-CM | POA: Diagnosis not present

## 2018-12-08 DIAGNOSIS — J342 Deviated nasal septum: Secondary | ICD-10-CM | POA: Diagnosis not present

## 2018-12-08 DIAGNOSIS — E119 Type 2 diabetes mellitus without complications: Secondary | ICD-10-CM | POA: Diagnosis not present

## 2018-12-08 DIAGNOSIS — Z7982 Long term (current) use of aspirin: Secondary | ICD-10-CM | POA: Diagnosis not present

## 2018-12-08 DIAGNOSIS — Z9181 History of falling: Secondary | ICD-10-CM | POA: Diagnosis not present

## 2018-12-08 DIAGNOSIS — I10 Essential (primary) hypertension: Secondary | ICD-10-CM | POA: Diagnosis not present

## 2018-12-08 DIAGNOSIS — Z96642 Presence of left artificial hip joint: Secondary | ICD-10-CM | POA: Diagnosis not present

## 2018-12-08 DIAGNOSIS — K219 Gastro-esophageal reflux disease without esophagitis: Secondary | ICD-10-CM | POA: Diagnosis not present

## 2018-12-08 DIAGNOSIS — Z471 Aftercare following joint replacement surgery: Secondary | ICD-10-CM | POA: Diagnosis not present

## 2018-12-08 DIAGNOSIS — M1712 Unilateral primary osteoarthritis, left knee: Secondary | ICD-10-CM | POA: Diagnosis not present

## 2018-12-08 NOTE — Telephone Encounter (Signed)
Pt advised, call transferred to appointment schedulers

## 2018-12-11 DIAGNOSIS — J342 Deviated nasal septum: Secondary | ICD-10-CM | POA: Diagnosis not present

## 2018-12-11 DIAGNOSIS — E785 Hyperlipidemia, unspecified: Secondary | ICD-10-CM | POA: Diagnosis not present

## 2018-12-11 DIAGNOSIS — E119 Type 2 diabetes mellitus without complications: Secondary | ICD-10-CM | POA: Diagnosis not present

## 2018-12-11 DIAGNOSIS — Z7982 Long term (current) use of aspirin: Secondary | ICD-10-CM | POA: Diagnosis not present

## 2018-12-11 DIAGNOSIS — Z7984 Long term (current) use of oral hypoglycemic drugs: Secondary | ICD-10-CM | POA: Diagnosis not present

## 2018-12-11 DIAGNOSIS — Z96642 Presence of left artificial hip joint: Secondary | ICD-10-CM | POA: Diagnosis not present

## 2018-12-11 DIAGNOSIS — Z9181 History of falling: Secondary | ICD-10-CM | POA: Diagnosis not present

## 2018-12-11 DIAGNOSIS — M1712 Unilateral primary osteoarthritis, left knee: Secondary | ICD-10-CM | POA: Diagnosis not present

## 2018-12-11 DIAGNOSIS — Z471 Aftercare following joint replacement surgery: Secondary | ICD-10-CM | POA: Diagnosis not present

## 2018-12-11 DIAGNOSIS — I10 Essential (primary) hypertension: Secondary | ICD-10-CM | POA: Diagnosis not present

## 2018-12-11 DIAGNOSIS — K219 Gastro-esophageal reflux disease without esophagitis: Secondary | ICD-10-CM | POA: Diagnosis not present

## 2018-12-13 ENCOUNTER — Telehealth: Payer: Self-pay | Admitting: Family Medicine

## 2018-12-13 DIAGNOSIS — E119 Type 2 diabetes mellitus without complications: Secondary | ICD-10-CM | POA: Diagnosis not present

## 2018-12-13 DIAGNOSIS — I10 Essential (primary) hypertension: Secondary | ICD-10-CM | POA: Diagnosis not present

## 2018-12-13 DIAGNOSIS — Z9181 History of falling: Secondary | ICD-10-CM | POA: Diagnosis not present

## 2018-12-13 DIAGNOSIS — Z96642 Presence of left artificial hip joint: Secondary | ICD-10-CM | POA: Diagnosis not present

## 2018-12-13 DIAGNOSIS — E785 Hyperlipidemia, unspecified: Secondary | ICD-10-CM | POA: Diagnosis not present

## 2018-12-13 DIAGNOSIS — Z7984 Long term (current) use of oral hypoglycemic drugs: Secondary | ICD-10-CM | POA: Diagnosis not present

## 2018-12-13 DIAGNOSIS — Z7982 Long term (current) use of aspirin: Secondary | ICD-10-CM | POA: Diagnosis not present

## 2018-12-13 DIAGNOSIS — M1712 Unilateral primary osteoarthritis, left knee: Secondary | ICD-10-CM | POA: Diagnosis not present

## 2018-12-13 DIAGNOSIS — K219 Gastro-esophageal reflux disease without esophagitis: Secondary | ICD-10-CM | POA: Diagnosis not present

## 2018-12-13 DIAGNOSIS — Z471 Aftercare following joint replacement surgery: Secondary | ICD-10-CM | POA: Diagnosis not present

## 2018-12-13 DIAGNOSIS — J342 Deviated nasal septum: Secondary | ICD-10-CM | POA: Diagnosis not present

## 2018-12-13 NOTE — Telephone Encounter (Signed)
Copied from [place]. Topic: Quick Communication - Home Health Verbal Orders >> Dec 13, 2018  2:21 PM Carolyn Stare wrote: Ezzard Flax with [facility] cal lto report pt bp  130/96 rest  activity 160/110   Callback Number [phone]

## 2018-12-14 ENCOUNTER — Other Ambulatory Visit (INDEPENDENT_AMBULATORY_CARE_PROVIDER_SITE_OTHER): Payer: Self-pay | Admitting: Orthopaedic Surgery

## 2018-12-14 ENCOUNTER — Encounter (INDEPENDENT_AMBULATORY_CARE_PROVIDER_SITE_OTHER): Payer: Self-pay | Admitting: Orthopaedic Surgery

## 2018-12-14 ENCOUNTER — Ambulatory Visit (INDEPENDENT_AMBULATORY_CARE_PROVIDER_SITE_OTHER): Payer: Self-pay

## 2018-12-14 ENCOUNTER — Ambulatory Visit (INDEPENDENT_AMBULATORY_CARE_PROVIDER_SITE_OTHER): Payer: Medicare Other | Admitting: Orthopaedic Surgery

## 2018-12-14 ENCOUNTER — Telehealth (INDEPENDENT_AMBULATORY_CARE_PROVIDER_SITE_OTHER): Payer: Self-pay | Admitting: Orthopaedic Surgery

## 2018-12-14 DIAGNOSIS — M1612 Unilateral primary osteoarthritis, left hip: Secondary | ICD-10-CM

## 2018-12-14 MED ORDER — OXYCODONE-ACETAMINOPHEN 5-325 MG PO TABS: 1.0000 | ORAL_TABLET | Freq: Three times a day (TID) | ORAL | 0 refills | Status: DC | PRN

## 2018-12-14 MED ORDER — OXYCODONE HCL 5 MG PO CAPS: 5.0000 mg | ORAL_CAPSULE | ORAL | 0 refills | Status: DC | PRN

## 2018-12-14 MED FILL — OXYCODONE-ACETAMINOPHEN 5-3: 5-325 | 10 days supply | Qty: 30 | Fill #0

## 2018-12-14 NOTE — Progress Notes (Signed)
Office Visit Note   Patient: Barbara Adkins           Date of Birth: [birth year]           MRN: [id number] Visit Date: 12/14/2018              Requested by: Forrest Moron, MD [place], [place] [address] PCP: Forrest Moron, MD   Assessment & Plan: Visit Diagnoses:  1. Unilateral primary osteoarthritis, left hip     Plan: 2 weeks status post primary left total hip replacement doing well.  No longer has the preoperative pain.  Independent with a walker and finishing a course of physical therapy.  I would like her to continue with therapy until she is independent with a cane.  Office 2 weeks.  Staples removed and Steri-Strips applied postop films in recovery room several weeks ago reveal excellent position of the components Follow-Up Instructions: Return in about 2 weeks (around 12/28/2018).   Orders:  Orders Placed This Encounter  Procedures  . XR HIP UNILAT W OR W/O PELVIS 2-3 VIEWS LEFT   No orders of the defined types were placed in this encounter.     Procedures: No procedures performed   Clinical Data: No additional findings.   Subjective: No chief complaint on file. Doing well at present time with minimal use of pain meds.  Independent with a walker following physical therapy protocol.  Bearing weight to tolerance.  Progressing toward use of a cane.  No related fever chills or shortness of breath  HPI  Review of Systems   Objective: Vital Signs: There were no vitals taken for this visit.  Physical Exam  Ortho Exam awake alert and oriented x3.  Comfortable sitting.  Not short of breath.  Leg lengths appear to be symmetrical.  Left hip incision healing without problem.  Staples removed and strips applied over benzoin.  Specialty Comments:  No specialty comments available.  Imaging: No results found.   PMFS History: Patient Active Problem List   Diagnosis Date Noted  . Status post THR (total hip replacement) 11/28/2018  . Unilateral  primary osteoarthritis, left hip 11/21/2018  . Weight gain 04/17/2018  . Dyslipidemia 04/17/2018  . Prediabetes 04/17/2018  . Bronchospasm 04/17/2018  . Essential hypertension 03/20/2018  . Class 1 obesity due to excess calories with serious comorbidity and body mass index (BMI) of 34.0 to 34.9 in adult 03/20/2018  . Acute pain of right knee 03/20/2018   Past Medical History:  Diagnosis Date  . Allergy   . Arthritis   . Deviated septum   . Diabetes mellitus without complication ([place])   . GERD (gastroesophageal reflux disease)   . Hypertension     Family History  Problem Relation Age of Onset  . Asthma Other   . Cancer Other   . Hypertension Other   . Diabetes Other   . Colon cancer Maternal Grandmother   . Breast cancer Neg Hx   . Esophageal cancer Neg Hx   . Rectal cancer Neg Hx   . Stomach cancer Neg Hx     Past Surgical History:  Procedure Laterality Date  . ETHMOIDECTOMY Right 05/17/2018   Procedure: RIGHT ETHMOIDECTOMY WITH TISSUE REMOVAL;  Surgeon: Leta Baptist, MD;  Location: [facility];  Service: ENT;  Laterality: Right;  . FRONTAL SINUS EXPLORATION Right 05/17/2018   Procedure: RIGHT FRONTAL RECESS EXPLORATION;  Surgeon: Leta Baptist, MD;  Location: [facility];  Service: ENT;  Laterality:  Right;  Marland Kitchen MAXILLARY ANTROSTOMY Right 05/17/2018   Procedure: RIGHT MAXILLARY ANTROSTOMY WITH TISSUE REMOVAL;  Surgeon: Leta Baptist, MD;  Location: [facility];  Service: ENT;  Laterality: Right;  . NASAL SEPTOPLASTY W/ TURBINOPLASTY Bilateral 05/17/2018   Procedure: NASAL SEPTOPLASTY WITH TURBINATE REDUCTION;  Surgeon: Leta Baptist, MD;  Location: [facility];  Service: ENT;  Laterality: Bilateral;  . SINUS ENDO WITH FUSION N/A 05/17/2018   Procedure: SINUS ENDO WITH FUSION;  Surgeon: Leta Baptist, MD;  Location: [facility];  Service: ENT;  Laterality: N/A;  . SPHENOIDECTOMY Right 05/17/2018   Procedure: RIGHT SPHENOIDECTOMY WITH TISSUE  REMOVAL;  Surgeon: Leta Baptist, MD;  Location: [facility];  Service: ENT;  Laterality: Right;  . 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   Procedure: TOTAL HIP ARTHROPLASTY;  Surgeon: Garald Balding, MD;  Location: [facility];  Service: Orthopedics;  Laterality: Left;  . TUBAL LIGATION     Social History   Occupational History  . Not on file  Tobacco Use  . Smoking status: Never Smoker  . Smokeless tobacco: Never Used  Substance and Sexual Activity  . Alcohol use: No  . Drug use: Never  . Sexual activity: Not on file     Garald Balding, MD   Note - This record has been created using [employer].  Chart creation errors have been sought, but may not always  have been located. Such creation errors do not reflect on  the standard of medical care.

## 2018-12-14 NOTE — Telephone Encounter (Signed)
Please resend

## 2018-12-14 NOTE — Telephone Encounter (Signed)
Patient called stating [facility] doesn't have any Oxycodone capsules only tablets and told her to contact Dr. Durward Fortes so he could write a new prescription.

## 2018-12-14 NOTE — Telephone Encounter (Signed)
done

## 2018-12-15 DIAGNOSIS — Z9181 History of falling: Secondary | ICD-10-CM | POA: Diagnosis not present

## 2018-12-15 DIAGNOSIS — Z96642 Presence of left artificial hip joint: Secondary | ICD-10-CM | POA: Diagnosis not present

## 2018-12-15 DIAGNOSIS — Z471 Aftercare following joint replacement surgery: Secondary | ICD-10-CM | POA: Diagnosis not present

## 2018-12-15 DIAGNOSIS — I10 Essential (primary) hypertension: Secondary | ICD-10-CM | POA: Diagnosis not present

## 2018-12-15 DIAGNOSIS — Z7984 Long term (current) use of oral hypoglycemic drugs: Secondary | ICD-10-CM | POA: Diagnosis not present

## 2018-12-15 DIAGNOSIS — J342 Deviated nasal septum: Secondary | ICD-10-CM | POA: Diagnosis not present

## 2018-12-15 DIAGNOSIS — E785 Hyperlipidemia, unspecified: Secondary | ICD-10-CM | POA: Diagnosis not present

## 2018-12-15 DIAGNOSIS — Z7982 Long term (current) use of aspirin: Secondary | ICD-10-CM | POA: Diagnosis not present

## 2018-12-15 DIAGNOSIS — M1712 Unilateral primary osteoarthritis, left knee: Secondary | ICD-10-CM | POA: Diagnosis not present

## 2018-12-15 DIAGNOSIS — E119 Type 2 diabetes mellitus without complications: Secondary | ICD-10-CM | POA: Diagnosis not present

## 2018-12-15 DIAGNOSIS — K219 Gastro-esophageal reflux disease without esophagitis: Secondary | ICD-10-CM | POA: Diagnosis not present

## 2018-12-15 NOTE — Telephone Encounter (Signed)
Please see note below. 

## 2018-12-15 NOTE — Telephone Encounter (Signed)
Informed pt of Dr. Kaleen Mask recommendations and she verbalized understanding.

## 2018-12-15 NOTE — Telephone Encounter (Signed)
Please let [place] know that as long as the blood pressure continues in the 130/90s at rest she can continue on her current meds. If she gets symptoms of chest pains or dizziness then she should come to the office for blood pressure visit.

## 2018-12-19 ENCOUNTER — Telehealth (INDEPENDENT_AMBULATORY_CARE_PROVIDER_SITE_OTHER): Payer: Self-pay | Admitting: Orthopaedic Surgery

## 2018-12-19 DIAGNOSIS — Z9181 History of falling: Secondary | ICD-10-CM | POA: Diagnosis not present

## 2018-12-19 DIAGNOSIS — M1712 Unilateral primary osteoarthritis, left knee: Secondary | ICD-10-CM | POA: Diagnosis not present

## 2018-12-19 DIAGNOSIS — E119 Type 2 diabetes mellitus without complications: Secondary | ICD-10-CM | POA: Diagnosis not present

## 2018-12-19 DIAGNOSIS — Z96642 Presence of left artificial hip joint: Secondary | ICD-10-CM | POA: Diagnosis not present

## 2018-12-19 DIAGNOSIS — Z7982 Long term (current) use of aspirin: Secondary | ICD-10-CM | POA: Diagnosis not present

## 2018-12-19 DIAGNOSIS — Z7984 Long term (current) use of oral hypoglycemic drugs: Secondary | ICD-10-CM | POA: Diagnosis not present

## 2018-12-19 DIAGNOSIS — J342 Deviated nasal septum: Secondary | ICD-10-CM | POA: Diagnosis not present

## 2018-12-19 DIAGNOSIS — K219 Gastro-esophageal reflux disease without esophagitis: Secondary | ICD-10-CM | POA: Diagnosis not present

## 2018-12-19 DIAGNOSIS — J310 Chronic rhinitis: Secondary | ICD-10-CM | POA: Diagnosis not present

## 2018-12-19 DIAGNOSIS — E785 Hyperlipidemia, unspecified: Secondary | ICD-10-CM | POA: Diagnosis not present

## 2018-12-19 DIAGNOSIS — I10 Essential (primary) hypertension: Secondary | ICD-10-CM | POA: Diagnosis not present

## 2018-12-19 DIAGNOSIS — Z471 Aftercare following joint replacement surgery: Secondary | ICD-10-CM | POA: Diagnosis not present

## 2018-12-19 DIAGNOSIS — J338 Other polyp of sinus: Secondary | ICD-10-CM | POA: Diagnosis not present

## 2018-12-19 MED ORDER — METHOCARBAMOL 500 MG PO TABS: 500.0000 mg | ORAL_TABLET | Freq: Two times a day (BID) | ORAL | 0 refills | Status: DC | PRN

## 2018-12-19 MED FILL — METHOCARBAMOL 500 MG TABS: 500 | 15 days supply | Qty: 30 | Fill #0

## 2018-12-19 NOTE — Telephone Encounter (Signed)
Please advise 

## 2018-12-19 NOTE — Telephone Encounter (Signed)
Prescription sent to pharmacy and patient has been notified.

## 2018-12-19 NOTE — Telephone Encounter (Signed)
Patient called stating she has been doing her exercises with her therapist who recommended a muscle relaxer instead of pain medicine.  Patient requested the prescription be sent to [facility].

## 2018-12-19 NOTE — Telephone Encounter (Signed)
Robaxin 500mg #30 1 tab po bid prn

## 2018-12-21 DIAGNOSIS — I10 Essential (primary) hypertension: Secondary | ICD-10-CM | POA: Diagnosis not present

## 2018-12-21 DIAGNOSIS — Z7982 Long term (current) use of aspirin: Secondary | ICD-10-CM | POA: Diagnosis not present

## 2018-12-21 DIAGNOSIS — Z96642 Presence of left artificial hip joint: Secondary | ICD-10-CM | POA: Diagnosis not present

## 2018-12-21 DIAGNOSIS — Z7984 Long term (current) use of oral hypoglycemic drugs: Secondary | ICD-10-CM | POA: Diagnosis not present

## 2018-12-21 DIAGNOSIS — E119 Type 2 diabetes mellitus without complications: Secondary | ICD-10-CM | POA: Diagnosis not present

## 2018-12-21 DIAGNOSIS — Z471 Aftercare following joint replacement surgery: Secondary | ICD-10-CM | POA: Diagnosis not present

## 2018-12-21 DIAGNOSIS — M1712 Unilateral primary osteoarthritis, left knee: Secondary | ICD-10-CM | POA: Diagnosis not present

## 2018-12-21 DIAGNOSIS — K219 Gastro-esophageal reflux disease without esophagitis: Secondary | ICD-10-CM | POA: Diagnosis not present

## 2018-12-21 DIAGNOSIS — E785 Hyperlipidemia, unspecified: Secondary | ICD-10-CM | POA: Diagnosis not present

## 2018-12-21 DIAGNOSIS — J342 Deviated nasal septum: Secondary | ICD-10-CM | POA: Diagnosis not present

## 2018-12-21 DIAGNOSIS — Z9181 History of falling: Secondary | ICD-10-CM | POA: Diagnosis not present

## 2018-12-26 ENCOUNTER — Ambulatory Visit (INDEPENDENT_AMBULATORY_CARE_PROVIDER_SITE_OTHER): Payer: Medicare Other | Admitting: Orthopaedic Surgery

## 2018-12-26 ENCOUNTER — Ambulatory Visit (INDEPENDENT_AMBULATORY_CARE_PROVIDER_SITE_OTHER): Payer: Self-pay

## 2018-12-26 ENCOUNTER — Encounter (INDEPENDENT_AMBULATORY_CARE_PROVIDER_SITE_OTHER): Payer: Self-pay | Admitting: Orthopaedic Surgery

## 2018-12-26 VITALS — BP 164/98 | HR 70 | Ht 62.5 in | Wt 185.0 lb

## 2018-12-26 DIAGNOSIS — E785 Hyperlipidemia, unspecified: Secondary | ICD-10-CM | POA: Diagnosis not present

## 2018-12-26 DIAGNOSIS — I10 Essential (primary) hypertension: Secondary | ICD-10-CM | POA: Diagnosis not present

## 2018-12-26 DIAGNOSIS — Z7982 Long term (current) use of aspirin: Secondary | ICD-10-CM | POA: Diagnosis not present

## 2018-12-26 DIAGNOSIS — Z96642 Presence of left artificial hip joint: Secondary | ICD-10-CM

## 2018-12-26 DIAGNOSIS — M1712 Unilateral primary osteoarthritis, left knee: Secondary | ICD-10-CM | POA: Diagnosis not present

## 2018-12-26 DIAGNOSIS — J342 Deviated nasal septum: Secondary | ICD-10-CM | POA: Diagnosis not present

## 2018-12-26 DIAGNOSIS — Z7984 Long term (current) use of oral hypoglycemic drugs: Secondary | ICD-10-CM | POA: Diagnosis not present

## 2018-12-26 DIAGNOSIS — M25562 Pain in left knee: Secondary | ICD-10-CM

## 2018-12-26 DIAGNOSIS — Z9181 History of falling: Secondary | ICD-10-CM | POA: Diagnosis not present

## 2018-12-26 DIAGNOSIS — K219 Gastro-esophageal reflux disease without esophagitis: Secondary | ICD-10-CM | POA: Diagnosis not present

## 2018-12-26 DIAGNOSIS — E119 Type 2 diabetes mellitus without complications: Secondary | ICD-10-CM | POA: Diagnosis not present

## 2018-12-26 DIAGNOSIS — Z471 Aftercare following joint replacement surgery: Secondary | ICD-10-CM | POA: Diagnosis not present

## 2018-12-26 MED ORDER — LIDOCAINE HCL 1 % IJ SOLN
2.0000 mL | INTRAMUSCULAR | Status: AC | PRN
  Administered 2018-12-26: 2 mL

## 2018-12-26 MED ORDER — METHYLPREDNISOLONE ACETATE 40 MG/ML IJ SUSP
80.0000 mg | INTRAMUSCULAR | Status: AC | PRN
  Administered 2018-12-26: 80 mg

## 2018-12-26 MED ORDER — BUPIVACAINE HCL 0.5 % IJ SOLN
2.0000 mL | INTRAMUSCULAR | Status: AC | PRN
  Administered 2018-12-26: 2 mL via INTRA_ARTICULAR

## 2018-12-26 NOTE — Progress Notes (Signed)
Office Visit Note   Patient: Barbara Adkins           Date of Birth: [birth year]           MRN: [id number] Visit Date: 12/26/2018              Requested by: Forrest Moron, MD [place], [place] [address] PCP: Forrest Moron, MD   Assessment & Plan: Visit Diagnoses:  1. Acute pain of left knee   2. Status post left hip replacement     Plan: 1 month status post primary left total hip replacement and very happy the results.  Walking with the use of a cane.  Having osteoarthritic symptoms in left knee would like a cortisone injection.  Will inject and reevaluate in 6 weeks.  Continue with a home exercise program for her hip replacement  Follow-Up Instructions: Return in about 6 weeks (around 02/06/2019).   Orders:  Orders Placed This Encounter  Procedures  . XR KNEE 3 VIEW LEFT   No orders of the defined types were placed in this encounter.     Procedures: Large Joint Inj: L knee on 12/26/2018 5:32 PM Indications: pain and diagnostic evaluation Details: 25 G 1.5 in needle, anteromedial approach  Arthrogram: No  Medications: 2 mL lidocaine 1 %; 2 mL bupivacaine 0.5 %; 80 mg methylPREDNISolone acetate 40 MG/ML Procedure, treatment alternatives, risks and benefits explained, specific risks discussed. Consent was given by the patient. Patient was prepped and draped in the usual sterile fashion.       Clinical Data: No additional findings.   Subjective: Chief Complaint  Patient presents with  . Left Hip - Routine Post Op    4 weeks post left total hip arthroplasty. She says it is doing fine, gets a throbbing sensation occasionally otherwise doing good.  . Left Knee - Pain    Twisted it and felt like something popped and had a burning sensation.    HPI  Review of Systems   Objective: Vital Signs: BP (!) 164/98 (BP Location: Left Arm, Patient Position: Sitting)   Pulse 70   Ht 5' 2.5" (1.588 m)   Wt 185 lb (83.9 kg)   BMI 33.30 kg/m    Physical Exam Constitutional:      Appearance: She is well-developed.  Eyes:     Pupils: Pupils are equal, round, and reactive to light.  Pulmonary:     Effort: Pulmonary effort is normal.  Skin:    General: Skin is warm and dry.  Neurological:     Mental Status: She is alert and oriented to person, place, and time.  Psychiatric:        Behavior: Behavior normal.     Ortho Exam painless range of motion left hip.  Mild thigh atrophy.  Palpable osteophytes along the medial compartment of her left knee with small effusion.  Lacks just a few degrees to full extension and flexed over 105 degrees without instability.  Neurologically intact distally.  No edema  Specialty Comments:  No specialty comments available.  Imaging: Xr Knee 3 View Left  Result Date: 12/26/2018 Films of the left knee obtained in 3 projections standing.  There is near complete collapse of the medial compartment with subchondral cyst and sclerosis as well as peripheral osteophytes.  There is about 4 degrees of varus.  Some degenerative changes in the lateral compartment also in the patellofemoral region with considerable lateral patella tilt osteophytes more lateral than medial and decrease in  the joint space laterally.  Films are consistent with advanced osteoarthritis    PMFS History: Patient Active Problem List   Diagnosis Date Noted  . Status post left hip replacement 11/28/2018  . Unilateral primary osteoarthritis, left hip 11/21/2018  . Weight gain 04/17/2018  . Dyslipidemia 04/17/2018  . Prediabetes 04/17/2018  . Bronchospasm 04/17/2018  . Essential hypertension 03/20/2018  . Class 1 obesity due to excess calories with serious comorbidity and body mass index (BMI) of 34.0 to 34.9 in adult 03/20/2018  . Acute pain of left knee 03/20/2018   Past Medical History:  Diagnosis Date  . Allergy   . Arthritis   . Deviated septum   . Diabetes mellitus without complication ([place])   . GERD (gastroesophageal  reflux disease)   . Hypertension     Family History  Problem Relation Age of Onset  . Asthma Other   . Cancer Other   . Hypertension Other   . Diabetes Other   . Colon cancer Maternal Grandmother   . Breast cancer Neg Hx   . Esophageal cancer Neg Hx   . Rectal cancer Neg Hx   . Stomach cancer Neg Hx     Past Surgical History:  Procedure Laterality Date  . ETHMOIDECTOMY Right 05/17/2018   Procedure: RIGHT ETHMOIDECTOMY WITH TISSUE REMOVAL;  Surgeon: Leta Baptist, MD;  Location: [place];  Service: ENT;  Laterality: Right;  . FRONTAL SINUS EXPLORATION Right 05/17/2018   Procedure: RIGHT FRONTAL RECESS EXPLORATION;  Surgeon: Leta Baptist, MD;  Location: [place];  Service: ENT;  Laterality: Right;  . MAXILLARY ANTROSTOMY Right 05/17/2018   Procedure: RIGHT MAXILLARY ANTROSTOMY WITH TISSUE REMOVAL;  Surgeon: Leta Baptist, MD;  Location: [facility];  Service: ENT;  Laterality: Right;  . NASAL SEPTOPLASTY W/ TURBINOPLASTY Bilateral 05/17/2018   Procedure: NASAL SEPTOPLASTY WITH TURBINATE REDUCTION;  Surgeon: Leta Baptist, MD;  Location: [facility];  Service: ENT;  Laterality: Bilateral;  . SINUS ENDO WITH FUSION N/A 05/17/2018   Procedure: SINUS ENDO WITH FUSION;  Surgeon: Leta Baptist, MD;  Location: [place];  Service: ENT;  Laterality: N/A;  . SPHENOIDECTOMY Right 05/17/2018   Procedure: RIGHT SPHENOIDECTOMY WITH TISSUE REMOVAL;  Surgeon: Leta Baptist, MD;  Location: [place];  Service: ENT;  Laterality: Right;  . 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   Procedure: TOTAL HIP ARTHROPLASTY;  Surgeon: Garald Balding, MD;  Location: [facility];  Service: Orthopedics;  Laterality: Left;  . TUBAL LIGATION     Social History   Occupational History  . Not on file  Tobacco Use  . Smoking status: Never Smoker  . Smokeless tobacco: Never Used  Substance and Sexual Activity  . Alcohol use: No  . Drug use: Never  . Sexual  activity: Not on file     Garald Balding, MD   Note - This record has been created using [employer].  Chart creation errors have been sought, but may not always  have been located. Such creation errors do not reflect on  the standard of medical care.

## 2018-12-26 NOTE — Progress Notes (Deleted)
Wound Care Note   Patient: Barbara Adkins           Date of Birth: 03/31/1951           MRN: [id number]             PCP: Forrest Moron, MD Visit Date: 12/26/2018   Assessment & Plan: Visit Diagnoses: No diagnosis found.  Plan: ***   Follow-Up Instructions: No follow-ups on file.  Orders:  No orders of the defined types were placed in this encounter.  No orders of the defined types were placed in this encounter.     Procedures: No notes on file   Clinical Data: No additional findings.   No images are attached to the encounter.   Subjective: Chief Complaint  Patient presents with  . Left Hip - Routine Post Op    4 weeks post left total hip arthroplasty. She says it is doing fine, gets a throbbing sensation occasionally otherwise doing good.  . Left Knee - Pain    Twisted it and felt like something popped and had a burning sensation.    HPI  Review of Systems  Miscellaneous:  -Home Health Care: ***  -Physical Therapy: ***  -Out of Work?: ***  -Worker's Compensation Case?: ***  -Additional Information: ***   Objective: Vital Signs: BP (!) 164/98 (BP Location: Left Arm, Patient Position: Sitting)   Pulse 70   Ht 5' 2.5" (1.588 m)   Wt 185 lb (83.9 kg)   BMI 33.30 kg/m   Physical Exam: ***  Specialty Comments: No specialty comments available.   PMFS History: Patient Active Problem List   Diagnosis Date Noted  . Status post THR (total hip replacement) 11/28/2018  . Unilateral primary osteoarthritis, left hip 11/21/2018  . Weight gain 04/17/2018  . Dyslipidemia 04/17/2018  . Prediabetes 04/17/2018  . Bronchospasm 04/17/2018  . Essential hypertension 03/20/2018  . Class 1 obesity due to excess calories with serious comorbidity and body mass index (BMI) of 34.0 to 34.9 in adult 03/20/2018  . Acute pain of right knee 03/20/2018   Past Medical History:  Diagnosis Date  . Allergy   . Arthritis   . Deviated septum   . Diabetes mellitus  without complication ([place])   . GERD (gastroesophageal reflux disease)   . Hypertension     Family History  Problem Relation Age of Onset  . Asthma Other   . Cancer Other   . Hypertension Other   . Diabetes Other   . Colon cancer Maternal Grandmother   . Breast cancer Neg Hx   . Esophageal cancer Neg Hx   . Rectal cancer Neg Hx   . Stomach cancer Neg Hx    Past Surgical History:  Procedure Laterality Date  . ETHMOIDECTOMY Right 05/17/2018   Procedure: RIGHT ETHMOIDECTOMY WITH TISSUE REMOVAL;  Surgeon: Leta Baptist, MD;  Location: [place];  Service: ENT;  Laterality: Right;  . FRONTAL SINUS EXPLORATION Right 05/17/2018   Procedure: RIGHT FRONTAL RECESS EXPLORATION;  Surgeon: Leta Baptist, MD;  Location: [facility];  Service: ENT;  Laterality: Right;  . MAXILLARY ANTROSTOMY Right 05/17/2018   Procedure: RIGHT MAXILLARY ANTROSTOMY WITH TISSUE REMOVAL;  Surgeon: Leta Baptist, MD;  Location: [place] [facility];  Service: ENT;  Laterality: Right;  . NASAL SEPTOPLASTY W/ TURBINOPLASTY Bilateral 05/17/2018   Procedure: NASAL SEPTOPLASTY WITH TURBINATE REDUCTION;  Surgeon: Leta Baptist, MD;  Location: [place];  Service: ENT;  Laterality: Bilateral;  .  SINUS ENDO WITH FUSION N/A 05/17/2018   Procedure: SINUS ENDO WITH FUSION;  Surgeon: Leta Baptist, MD;  Location: [facility];  Service: ENT;  Laterality: N/A;  . SPHENOIDECTOMY Right 05/17/2018   Procedure: RIGHT SPHENOIDECTOMY WITH TISSUE REMOVAL;  Surgeon: Leta Baptist, MD;  Location: [facility];  Service: ENT;  Laterality: Right;  . 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   Procedure: TOTAL HIP ARTHROPLASTY;  Surgeon: Garald Balding, MD;  Location: [facility];  Service: Orthopedics;  Laterality: Left;  . TUBAL LIGATION     Social History   Occupational History  . Not on file  Tobacco Use  . Smoking status: Never Smoker  . Smokeless tobacco: Never Used  Substance and Sexual Activity  .  Alcohol use: No  . Drug use: Never  . Sexual activity: Not on file

## 2018-12-28 DIAGNOSIS — Z7982 Long term (current) use of aspirin: Secondary | ICD-10-CM | POA: Diagnosis not present

## 2018-12-28 DIAGNOSIS — Z7984 Long term (current) use of oral hypoglycemic drugs: Secondary | ICD-10-CM | POA: Diagnosis not present

## 2018-12-28 DIAGNOSIS — K219 Gastro-esophageal reflux disease without esophagitis: Secondary | ICD-10-CM | POA: Diagnosis not present

## 2018-12-28 DIAGNOSIS — M1712 Unilateral primary osteoarthritis, left knee: Secondary | ICD-10-CM | POA: Diagnosis not present

## 2018-12-28 DIAGNOSIS — Z9181 History of falling: Secondary | ICD-10-CM | POA: Diagnosis not present

## 2018-12-28 DIAGNOSIS — Z96642 Presence of left artificial hip joint: Secondary | ICD-10-CM | POA: Diagnosis not present

## 2018-12-28 DIAGNOSIS — E785 Hyperlipidemia, unspecified: Secondary | ICD-10-CM | POA: Diagnosis not present

## 2018-12-28 DIAGNOSIS — J342 Deviated nasal septum: Secondary | ICD-10-CM | POA: Diagnosis not present

## 2018-12-28 DIAGNOSIS — I10 Essential (primary) hypertension: Secondary | ICD-10-CM | POA: Diagnosis not present

## 2018-12-28 DIAGNOSIS — Z471 Aftercare following joint replacement surgery: Secondary | ICD-10-CM | POA: Diagnosis not present

## 2018-12-28 DIAGNOSIS — E119 Type 2 diabetes mellitus without complications: Secondary | ICD-10-CM | POA: Diagnosis not present

## 2019-01-02 ENCOUNTER — Telehealth: Payer: Self-pay | Admitting: Family Medicine

## 2019-01-02 NOTE — Telephone Encounter (Signed)
Called and spoke with pt regarding her appt scheduled for 6/8 with Dr. Nolon Rod. Because the appt has to be cancelled due to DrMarland Kitchen Nolon Rod being out of the office, we will wait to see what Dr. Nolon Rod says at the 4/20 appt. Pt acknowledged.

## 2019-01-03 ENCOUNTER — Other Ambulatory Visit: Payer: Self-pay | Admitting: Family Medicine

## 2019-01-03 MED FILL — ATORVASTATIN 20 MG TABLET: 20 | 90 days supply | Qty: 90 | Fill #0

## 2019-01-15 ENCOUNTER — Other Ambulatory Visit: Payer: Self-pay | Admitting: Family Medicine

## 2019-01-15 DIAGNOSIS — R7303 Prediabetes: Secondary | ICD-10-CM

## 2019-01-16 MED FILL — metFORMIN HCL 500 MG TABS: 500 | 90 days supply | Qty: 90 | Fill #0

## 2019-01-16 NOTE — Telephone Encounter (Signed)
Requested Prescriptions  Pending Prescriptions Disp Refills  . metFORMIN (GLUCOPHAGE) 500 MG tablet [Pharmacy Med Name: metFORMIN HCL 500 MG TABS 500 TAB] 90 tablet 0    Sig: TAKE 1 TABLET (500 MG TOTAL) BY MOUTH DAILY WITH BREAKFAST.     Endocrinology:  Diabetes - Biguanides Failed - 01/15/2019  1:10 PM      Failed - Cr in normal range and within 360 days    Creatinine, Ser  Date Value Ref Range Status  11/30/2018 1.02 (H) 0.44 - 1.00 mg/dL Final         Passed - HBA1C is between 0 and 7.9 and within 180 days    Hgb A1c MFr Bld  Date Value Ref Range Status  11/28/2018 6.0 (H) 4.8 - 5.6 % Final    Comment:    (NOTE) Pre diabetes:          5.7%-6.4% Diabetes:              >6.4% Glycemic control for   <7.0% adults with diabetes          Passed - eGFR in normal range and within 360 days    GFR calc Af Amer  Date Value Ref Range Status  11/30/2018 >60 >60 mL/min Final   GFR calc non Af Amer  Date Value Ref Range Status  11/30/2018 57 (L) >60 mL/min Final         Passed - Valid encounter within last 6 months    Recent Outpatient Visits          2 months ago Encounter for Medicare annual wellness exam   Primary Care at [facility], Arlie Solomons, MD   4 months ago Essential hypertension   Primary Care at [facility], Arlie Solomons, MD   5 months ago Preoperative clearance   Primary Care at [facility], Arlie Solomons, MD   6 months ago Essential hypertension   Primary Care at [facility], Arlie Solomons, MD   7 months ago Iatrogenic hypotension   Primary Care at [facility], Arlie Solomons, MD      Future Appointments            In 3 weeks Durward Fortes, Vonna Kotyk, MD [place]   In 1 month Forrest Moron, MD Primary Care at [place], [facility]

## 2019-02-06 ENCOUNTER — Ambulatory Visit (INDEPENDENT_AMBULATORY_CARE_PROVIDER_SITE_OTHER): Payer: Medicare Other | Admitting: Orthopaedic Surgery

## 2019-02-06 ENCOUNTER — Encounter (INDEPENDENT_AMBULATORY_CARE_PROVIDER_SITE_OTHER): Payer: Self-pay | Admitting: Orthopaedic Surgery

## 2019-02-06 ENCOUNTER — Other Ambulatory Visit: Payer: Self-pay

## 2019-02-06 VITALS — BP 168/96 | HR 65 | Ht 63.0 in | Wt 185.0 lb

## 2019-02-06 DIAGNOSIS — M1712 Unilateral primary osteoarthritis, left knee: Secondary | ICD-10-CM | POA: Insufficient documentation

## 2019-02-06 DIAGNOSIS — Z96642 Presence of left artificial hip joint: Secondary | ICD-10-CM | POA: Diagnosis not present

## 2019-02-06 NOTE — Progress Notes (Signed)
Office Visit Note   Patient: Barbara Adkins           Date of Birth: 05-Dec-1950           MRN: [id number] Visit Date: 02/06/2019              Requested by: Forrest Moron, MD [place], [place] [address] PCP: Forrest Moron, MD   Assessment & Plan: Visit Diagnoses:  1. Status post left hip replacement   2. Unilateral primary osteoarthritis, left knee     Plan: 10 weeks status post primary left total hip replacement and very happy with the results.  Not taking any pain medicines performing her exercises.  Also has advanced osteoarthritis of the left knee recent cortisone injection made a big difference as well.  Uses a cane for ambulation related to the knee arthritis.  Also has evidence of the same in the right knee.  Long discussion today regarding use of antibiotics for at least 2 years after her hip replacement for any invasive procedure but she may expect over time in regards to the arthritis of both of her knees.  We will plan to see her back in 6 months or sooner if she has an issue with either her left hip or either knee  Follow-Up Instructions: Return in about 6 months (around 08/09/2019).   Orders:  No orders of the defined types were placed in this encounter.  No orders of the defined types were placed in this encounter.     Procedures: No procedures performed   Clinical Data: No additional findings.   Subjective: Chief Complaint  Patient presents with  . Left Knee - Follow-up  . Left Hip - Routine Post Op    Left THA DOS 11/28/18  Patient presents today for a follow up on her left knee pain. She had a cortisone injection 6 weeks ago. She is also now 10 weeks out from a left total hip arthroplasty. Patient states that her knee is doing better, but still a little sore. Her hip is doing well, just some stiffness upon getting up. She is doing home exercises. She takes tylenol and aspirin.  No related fever or chills.  Notes that she does have a limp  referable to her left knee osteoarthritis but presently controlled with Tylenol  HPI  Review of Systems   Objective: Vital Signs: BP (!) 168/96   Pulse 65   Ht 5\' 3"  (1.6 m)   Wt 185 lb (83.9 kg)   BMI 32.77 kg/m   Physical Exam Constitutional:      Appearance: She is well-developed.  Eyes:     Pupils: Pupils are equal, round, and reactive to light.  Pulmonary:     Effort: Pulmonary effort is normal.  Skin:    General: Skin is warm and dry.  Neurological:     Mental Status: She is alert and oriented to person, place, and time.  Psychiatric:        Behavior: Behavior normal.     Ortho Exam awake alert and oriented x3.  Comfortable sitting.  Painless range of motion of left hip with internal and external rotation.  No pain about the hip incision.  Leg lengths appear to be symmetrical both objectively and subjectively.  No effusion left knee but does have a small effusion right knee.  Predominant medial joint pain bilaterally.  Prior films of her left knee demonstrate advanced osteoarthritis.  Full extension at least 120 degrees of flexion  neurologically intact  Specialty Comments:  No specialty comments available.  Imaging: No results found.   PMFS History: Patient Active Problem List   Diagnosis Date Noted  . Unilateral primary osteoarthritis, left knee 02/06/2019  . Status post left hip replacement 11/28/2018  . Unilateral primary osteoarthritis, left hip 11/21/2018  . Weight gain 04/17/2018  . Dyslipidemia 04/17/2018  . Prediabetes 04/17/2018  . Bronchospasm 04/17/2018  . Essential hypertension 03/20/2018  . Class 1 obesity due to excess calories with serious comorbidity and body mass index (BMI) of 34.0 to 34.9 in adult 03/20/2018  . Acute pain of left knee 03/20/2018   Past Medical History:  Diagnosis Date  . Allergy   . Arthritis   . Deviated septum   . Diabetes mellitus without complication ([place])   . GERD (gastroesophageal reflux disease)   .  Hypertension     Family History  Problem Relation Age of Onset  . Asthma Other   . Cancer Other   . Hypertension Other   . Diabetes Other   . Colon cancer Maternal Grandmother   . Breast cancer Neg Hx   . Esophageal cancer Neg Hx   . Rectal cancer Neg Hx   . Stomach cancer Neg Hx     Past Surgical History:  Procedure Laterality Date  . ETHMOIDECTOMY Right 05/17/2018   Procedure: RIGHT ETHMOIDECTOMY WITH TISSUE REMOVAL;  Surgeon: Leta Baptist, MD;  Location: [place];  Service: ENT;  Laterality: Right;  . FRONTAL SINUS EXPLORATION Right 05/17/2018   Procedure: RIGHT FRONTAL RECESS EXPLORATION;  Surgeon: Leta Baptist, MD;  Location: [place];  Service: ENT;  Laterality: Right;  . MAXILLARY ANTROSTOMY Right 05/17/2018   Procedure: RIGHT MAXILLARY ANTROSTOMY WITH TISSUE REMOVAL;  Surgeon: Leta Baptist, MD;  Location: [place];  Service: ENT;  Laterality: Right;  . NASAL SEPTOPLASTY W/ TURBINOPLASTY Bilateral 05/17/2018   Procedure: NASAL SEPTOPLASTY WITH TURBINATE REDUCTION;  Surgeon: Leta Baptist, MD;  Location: [place];  Service: ENT;  Laterality: Bilateral;  . SINUS ENDO WITH FUSION N/A 05/17/2018   Procedure: SINUS ENDO WITH FUSION;  Surgeon: Leta Baptist, MD;  Location: [place];  Service: ENT;  Laterality: N/A;  . SPHENOIDECTOMY Right 05/17/2018   Procedure: RIGHT SPHENOIDECTOMY WITH TISSUE REMOVAL;  Surgeon: Leta Baptist, MD;  Location: [place];  Service: ENT;  Laterality: Right;  . 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   Procedure: TOTAL HIP ARTHROPLASTY;  Surgeon: Garald Balding, MD;  Location: [place];  Service: Orthopedics;  Laterality: Left;  . TUBAL LIGATION     Social History   Occupational History  . Not on file  Tobacco Use  . Smoking status: Never Smoker  . Smokeless tobacco: Never Used  Substance and Sexual Activity  . Alcohol use: No  . Drug use: Never  . Sexual activity: Not on file

## 2019-02-14 MED FILL — OMEPRAZOLE 20 MG CPDR: 20 | 90 days supply | Qty: 90 | Fill #0

## 2019-02-14 MED FILL — LOSARTAN POTASSIUM 50 MG TA: 50 | 90 days supply | Qty: 135 | Fill #0

## 2019-03-05 ENCOUNTER — Telehealth (INDEPENDENT_AMBULATORY_CARE_PROVIDER_SITE_OTHER): Payer: Medicare Other | Admitting: Family Medicine

## 2019-03-05 ENCOUNTER — Other Ambulatory Visit: Payer: Self-pay

## 2019-03-05 DIAGNOSIS — M130 Polyarthritis, unspecified: Secondary | ICD-10-CM | POA: Diagnosis not present

## 2019-03-05 DIAGNOSIS — I10 Essential (primary) hypertension: Secondary | ICD-10-CM | POA: Diagnosis not present

## 2019-03-05 MED ORDER — MELOXICAM 7.5 MG PO TABS: 7.5000 mg | ORAL_TABLET | Freq: Every day | ORAL | 3 refills | Status: DC

## 2019-03-05 MED ORDER — BLOOD PRESSURE KIT DEVI: 1.0000 | 0 refills | Status: AC

## 2019-03-05 MED FILL — MELOXICAM 7.5 MG TABLET: 7.5 | 30 days supply | Qty: 30 | Fill #0

## 2019-03-05 NOTE — Progress Notes (Signed)
CC: 3 month f/u HTN.  Concerns about right knee swelling and left shoulder.  No travel outside the [place] or [place] in the past 3 weeks.

## 2019-03-05 NOTE — Progress Notes (Signed)
Telemedicine Encounter- SOAP NOTE Established Patient  This telephone encounter was conducted with the patient's (or proxy's) verbal consent via audio telecommunications: yes/no: Yes Patient was instructed to have this encounter in a suitably private space; and to only have persons present to whom they give permission to participate. In addition, patient identity was confirmed by use of name plus two identifiers (DOB and address).  I discussed the limitations, risks, security and privacy concerns of performing an evaluation and management service by telephone and the availability of in person appointments. I also discussed with the patient that there may be a patient responsible charge related to this service. The patient expressed understanding and agreed to proceed.  I spent a total of TIME; 0 MIN TO 60 MIN: 20 minutes talking with the patient or their proxy.  CC: arthritis, essential hypertension   Subjective   Barbara U Swaim is a 68 y.o. established patient. Telephone visit today for  HPI  Hypertension Hypertension: Patient here for follow-up of elevated blood pressure. She is not exercising due to arthritis and is adherent to low salt diet.  Blood pressure is well controlled at home. Cardiac symptoms none. Patient denies chest pain, chest pressure/discomfort, claudication and dyspnea.  Cardiovascular risk factors: advanced age (older than 44 for men, 44 for women), family history of premature cardiovascular disease and hypertension. Use of agents associated with hypertension: none. History of target organ damage: none. BP Readings from Last 3 Encounters:  02/06/19 (!) 168/96  12/26/18 (!) 164/98  11/30/18 117/81    Osteoarthritis She reports that her left hip pain has resolved after surgery 3 months ago She states that it has healed really well She states that she has some soreness if she presses on the hip scar on the left.  She states that the right knee knee is  swelling and is stiff in the morning with inflammation She states that the left shoulder pain has been noticeable after using the walker for surgery She states that she was using the bedside commode as a walker because she broke her walker  Patient Active Problem List   Diagnosis Date Noted   Unilateral primary osteoarthritis, left knee 02/06/2019   Status post left hip replacement 11/28/2018   Unilateral primary osteoarthritis, left hip 11/21/2018   Weight gain 04/17/2018   Dyslipidemia 04/17/2018   Prediabetes 04/17/2018   Bronchospasm 04/17/2018   Essential hypertension 03/20/2018   Class 1 obesity due to excess calories with serious comorbidity and body mass index (BMI) of 34.0 to 34.9 in adult 03/20/2018   Acute pain of left knee 03/20/2018    Past Medical History:  Diagnosis Date   Allergy    Arthritis    Deviated septum    Diabetes mellitus without complication (HCC)    GERD (gastroesophageal reflux disease)    Hypertension     Current Outpatient Medications  Medication Sig Dispense Refill   acetaminophen (TYLENOL) 325 MG tablet Take 2 tablets (650 mg total) by mouth every 6 (six) hours.     albuterol (PROVENTIL HFA;VENTOLIN HFA) 108 (90 Base) MCG/ACT inhaler Inhale 2 puffs into the lungs every 6 (six) hours as needed for wheezing or shortness of breath.     atorvastatin (LIPITOR) 20 MG tablet TAKE 1 TABLET BY MOUTH DAILY. 90 tablet 0   cetirizine (ZYRTEC) 10 MG tablet Take 1 tablet (10 mg total) by mouth daily. 30 tablet 11   losartan (COZAAR) 50 MG tablet Take 1.5 tablets (75 mg total) by mouth daily.  135 tablet 3   metFORMIN (GLUCOPHAGE) 500 MG tablet TAKE 1 TABLET (500 MG TOTAL) BY MOUTH DAILY WITH BREAKFAST. 90 tablet 0   methocarbamol (ROBAXIN) 500 MG tablet Take 1 tablet (500 mg total) by mouth 2 (two) times daily as needed for muscle spasms. 30 tablet 0   omeprazole (PRILOSEC) 20 MG capsule TAKE 1 TABLET BY MOUTH DAILY. (Patient taking  differently: Take 20 mg by mouth daily. ) 90 capsule 1   Blood Pressure Monitoring (BLOOD PRESSURE KIT) DEVI 1 Device by Does not apply route 3 (three) times a week. Use to check blood pressure three times a week. ICD10 I10 1 Device 0   meloxicam (MOBIC) 7.5 MG tablet Take 1 tablet (7.5 mg total) by mouth daily. 30 tablet 3   No current facility-administered medications for this visit.     No Known Allergies  Social History   Socioeconomic History   Marital status: Divorced    Spouse name: Not on file   Number of children: Not on file   Years of education: Not on file   Highest education level: Not on file  Occupational History   Not on file  Social Needs   Financial resource strain: Not on file   Food insecurity:    Worry: Not on file    Inability: Not on file   Transportation needs:    Medical: Not on file    Non-medical: Not on file  Tobacco Use   Smoking status: Never Smoker   Smokeless tobacco: Never Used  Substance and Sexual Activity   Alcohol use: No   Drug use: Never   Sexual activity: Not on file  Lifestyle   Physical activity:    Days per week: Not on file    Minutes per session: Not on file   Stress: Not on file  Relationships   Social connections:    Talks on phone: Not on file    Gets together: Not on file    Attends religious service: Not on file    Active member of club or organization: Not on file    Attends meetings of clubs or organizations: Not on file    Relationship status: Not on file   Intimate partner violence:    Fear of current or ex partner: Not on file    Emotionally abused: Not on file    Physically abused: Not on file    Forced sexual activity: Not on file  Other Topics Concern   Not on file  Social History Narrative   Not on file    ROS Review of Systems  Constitutional: Negative for activity change, appetite change, chills and fever.  HENT: Negative for congestion, nosebleeds, trouble swallowing and  voice change.   Respiratory: Negative for cough, shortness of breath and wheezing.   Gastrointestinal: Negative for diarrhea, nausea and vomiting.  Genitourinary: Negative for difficulty urinating, dysuria, flank pain and hematuria.  Musculoskeletal: see hpi Neurological: Negative for dizziness, speech difficulty, light-headedness and numbness.  See HPI. All other review of systems negative.   Objective   Vitals as reported by the patient: There were no vitals filed for this visit.  Diagnoses and all orders for this visit:  Essential hypertension- discussed hypertension  Patient's blood pressure is at goal of 139/89 or less. Condition is stable. Continue current medications and treatment plan. I recommend that you exercise for 30-45 minutes 5 days a week. I also recommend a balanced diet with fruits and vegetables every day, lean meats, and  little fried foods. The DASH diet (you can find this online) is a good example of this.  -     meloxicam (MOBIC) 7.5 MG tablet; Take 1 tablet (7.5 mg total) by mouth daily. -     Blood Pressure Monitoring (BLOOD PRESSURE KIT) DEVI; 1 Device by Does not apply route 3 (three) times a week. Use to check blood pressure three times a week. ICD10 I10 -     Basic metabolic panel; Future  Arthritis of multiple sites- will try meloxicam for pain  Discussed renal function  Will monitor bp as well Discussed pt should come in for labs     I discussed the assessment and treatment plan with the patient. The patient was provided an opportunity to ask questions and all were answered. The patient agreed with the plan and demonstrated an understanding of the instructions.   The patient was advised to call back or seek an in-person evaluation if the symptoms worsen or if the condition fails to improve as anticipated.  I provided 20 minutes of non-face-to-face time during this encounter.  Forrest Moron, MD  Primary Care at [facility]

## 2019-03-05 NOTE — Patient Instructions (Signed)
° ° ° °  If you have lab work done today you will be contacted with your lab results within the next 2 weeks.  If you have not heard from us then please contact us. The fastest way to get your results is to register for My Chart. ° ° °IF you received an x-ray today, you will receive an invoice from [facility]. Please contact [facility] at [phone] with questions or concerns regarding your invoice.  ° °IF you received labwork today, you will receive an invoice from [facility]. Please contact [facility] at [phone] with questions or concerns regarding your invoice.  ° °Our billing staff will not be able to assist you with questions regarding bills from these companies. ° °You will be contacted with the lab results as soon as they are available. The fastest way to get your results is to activate your My Chart account. Instructions are located on the last page of this paperwork. If you have not heard from us regarding the results in 2 weeks, please contact this office. °  ° ° ° °

## 2019-03-08 IMAGING — CT CT MAXILLOFACIAL W/O CM
3 of 5 series · 13 of 47 positions shown, 15 images · non-contrast
Comparison: None.

CLINICAL DATA: Chronic sinusitis

EXAM:
CT MAXILLOFACIAL WITHOUT CONTRAST
TECHNIQUE: Multidetector CT images of the paranasal sinuses were obtained using
the standard protocol without intravenous contrast.

[Series 2: sinus 2.00 hr60 s3 ax · axial · 0.33mm/px · z∈[-619,-491]mm · 7 of 84 slices shown, 9 images]
[im 10/84  brain]
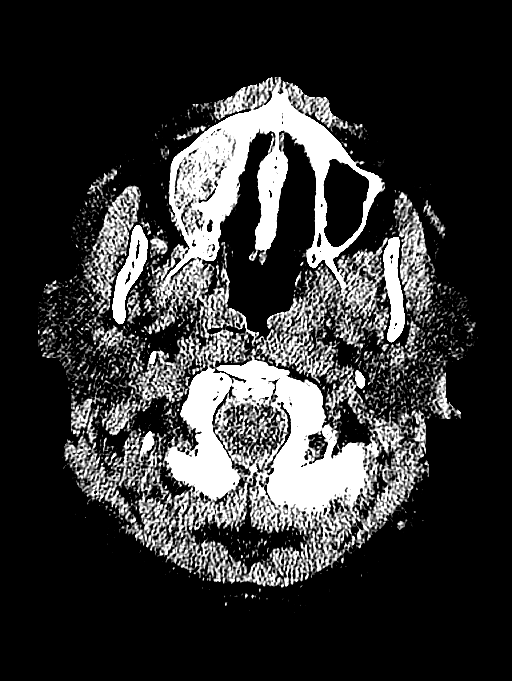
[im 10/84  bone]
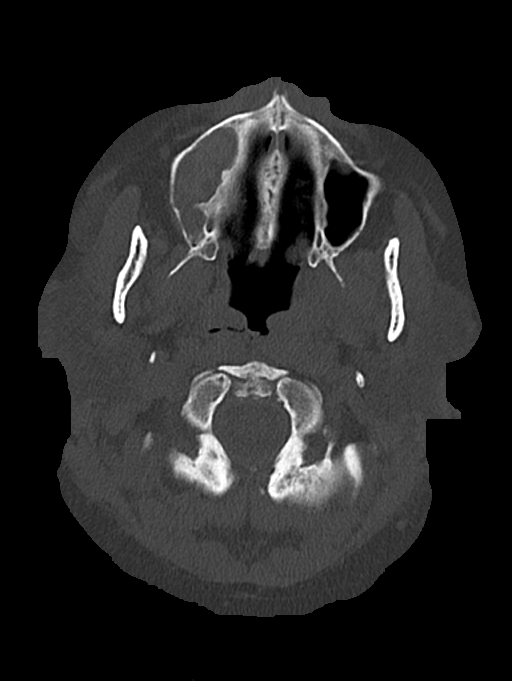
[im 19/84  bone]
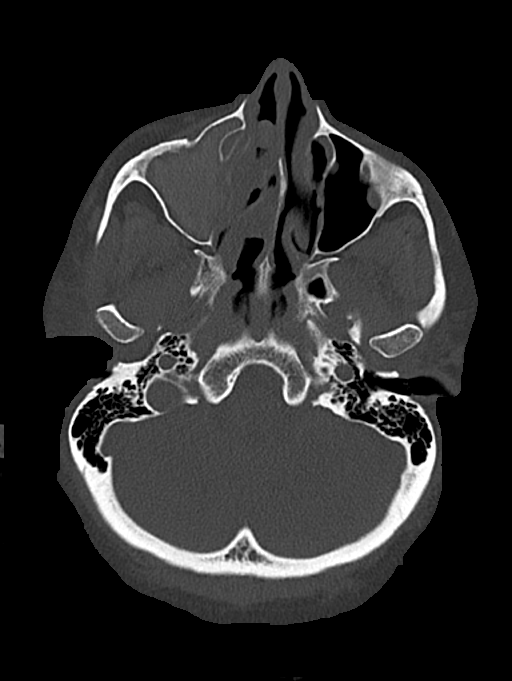
[im 28/84  bone]
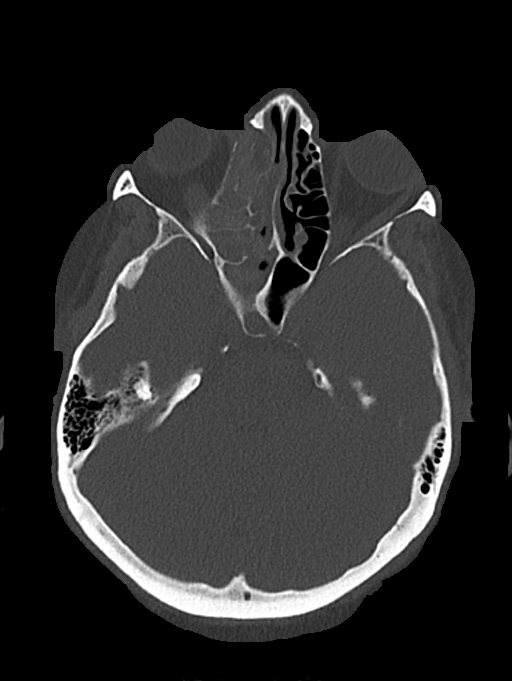
[im 47/84  bone]
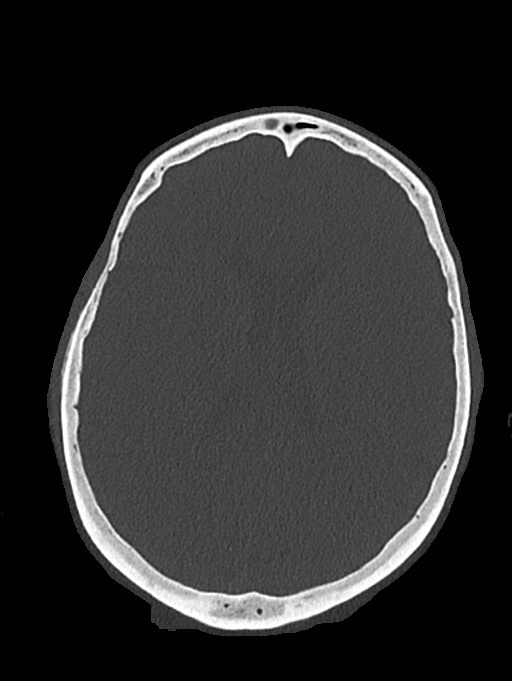
[im 56/84  brain]
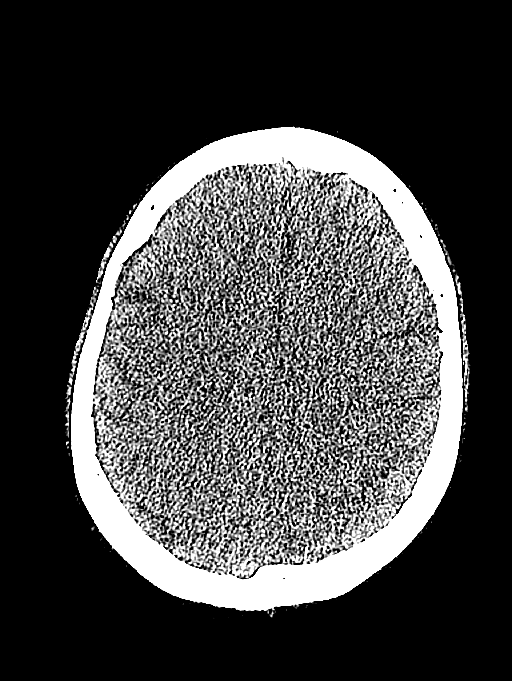
[im 56/84  bone]
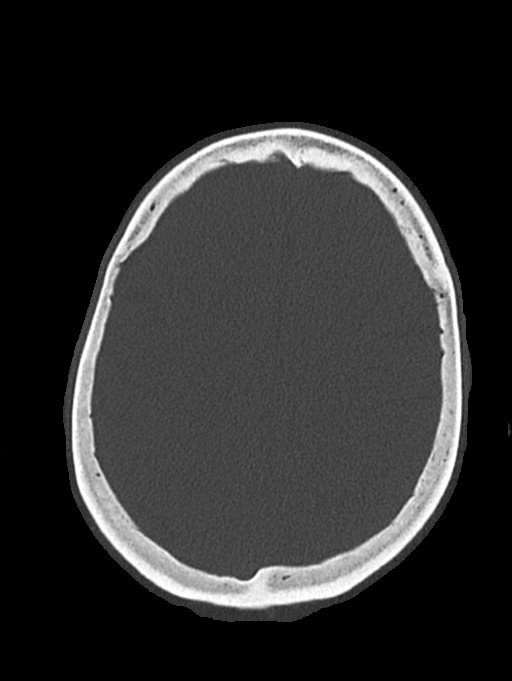
[im 65/84  bone]
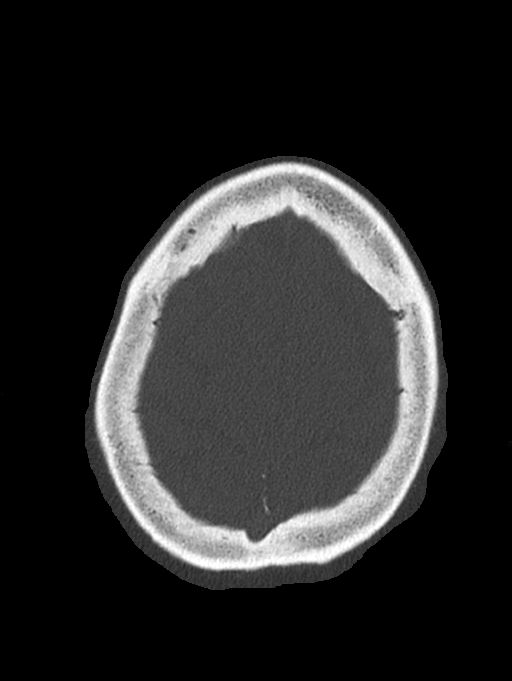
[im 74/84  bone]
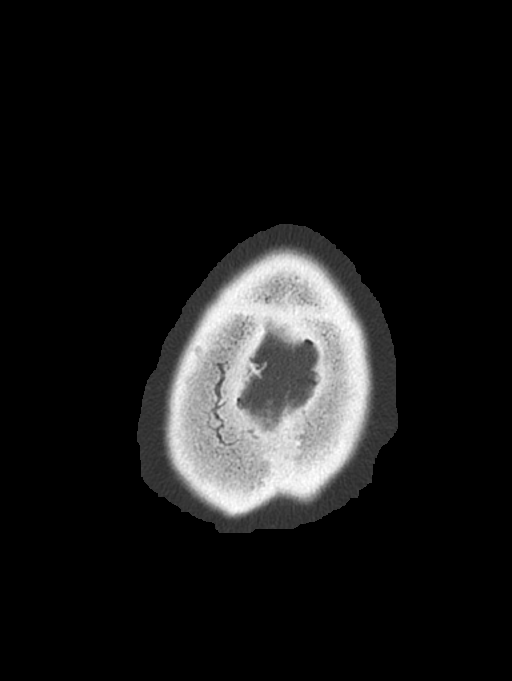

[Series 4: sinus 2.00 hr60 s3 cor · coronal · 0.33mm/px · 3 of 112 slices shown]
[im 38/112  bone]
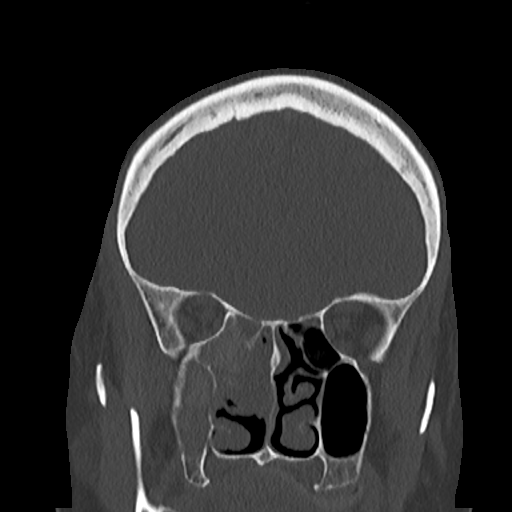
[im 50/112  bone]
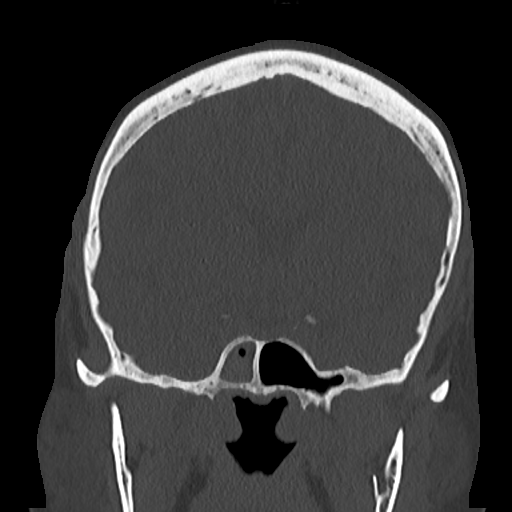
[im 62/112  bone]
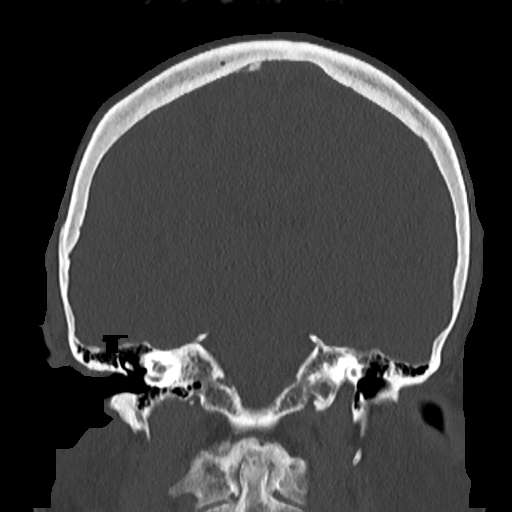

[Series 6: sinus 2.00 hr60 s3 sag · sagittal · 0.33mm/px · 3 of 84 slices shown]
[im 28/84  bone]
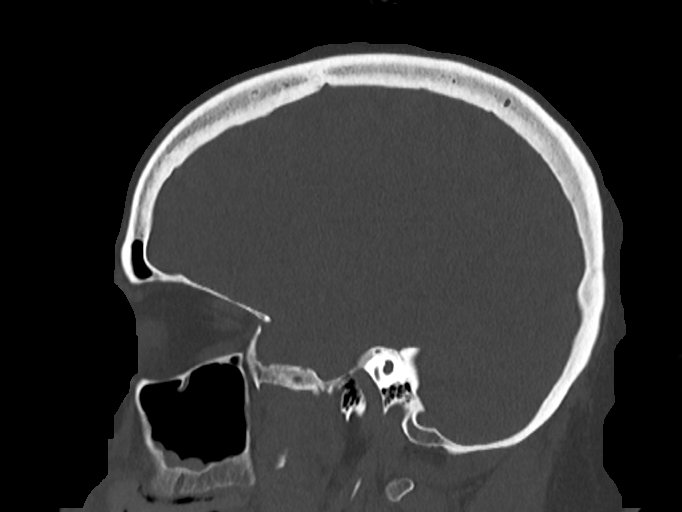
[im 42/84  bone]
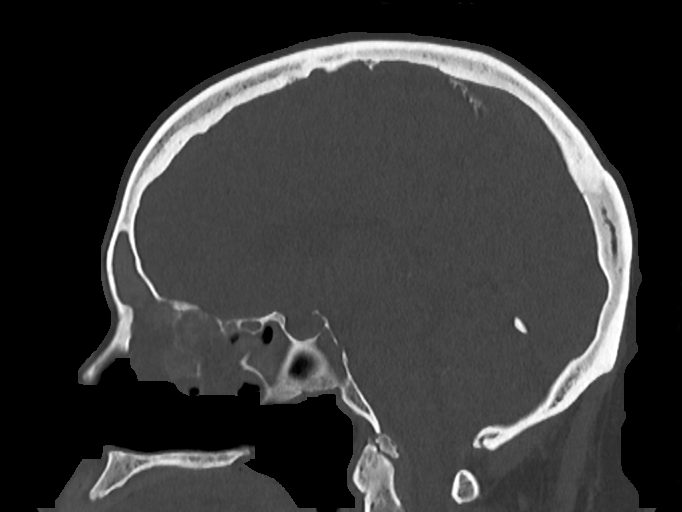
[im 56/84  bone]
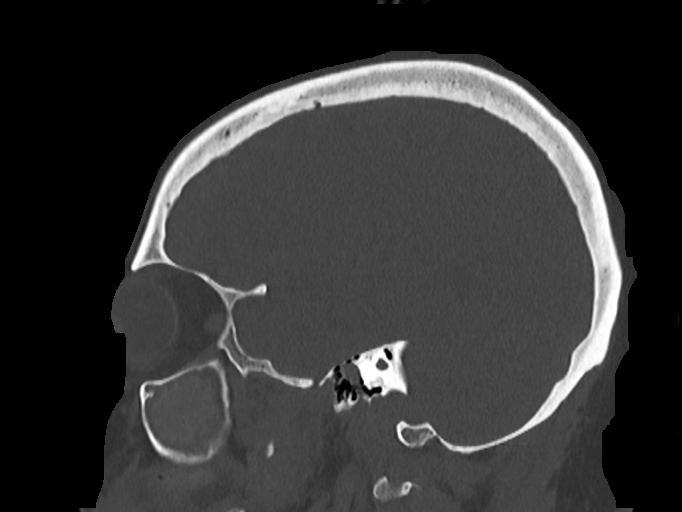

[13 of 47 positions shown; findings below may reference images not displayed]

FINDINGS: Paranasal sinuses:

Frontal: Hyperdense secretions fill the right frontal sinus. Left
frontal sinus clear

Ethmoid: Hyperdense secretions fill most of the right ethmoid sinus.
Left ethmoid sinus has mild mucosal edema

Maxillary: Hyperdense secretions fill the right maxillary sinus.
Minimal mucosal edema left maxillary sinus

Sphenoid: Hyperdense secretions in the right sphenoid sinus. Bony
thickening of the right sphenoid sinus. Left sphenoid sinus clear

Mastoid sinus: Clear bilaterally

Right ostiomeatal unit: Occluded

Left ostiomeatal unit: Patent.

Nasal passages: Right nasal passage is narrowed due to polypoid mass
superiorly. No bony destruction. Left nasal passage widely patent.
Mild septal deviation to the left

Other: Limited intracranial imaging negative. Normal orbit
bilaterally. Normal soft tissues of the face
IMPRESSION: Extensive right-sided sinus disease with hyperdense secretions in
the right frontal, ethmoid, maxillary, and sphenoid sinus suggesting
chronic secretions possibly with fungal infection. Polypoid mass in
the right superior nasal cavity likely causing obstruction of sinus
drainage on the right. Direct visualization and possible biopsy
suggested.

## 2019-03-28 ENCOUNTER — Other Ambulatory Visit: Payer: Self-pay | Admitting: Family Medicine

## 2019-03-28 MED FILL — MELOXICAM 7.5 MG TABLET: 7.5 | 30 days supply | Qty: 30 | Fill #1

## 2019-03-28 MED FILL — ATORVASTATIN 20 MG TABLET: 20 | 90 days supply | Qty: 90 | Fill #0

## 2019-03-28 MED FILL — PROAIR HFA 90 MCG INHALER: 108 (90 BAS | 17 days supply | Qty: 9 | Fill #0

## 2019-04-16 ENCOUNTER — Other Ambulatory Visit: Payer: Self-pay | Admitting: Family Medicine

## 2019-04-16 DIAGNOSIS — R7303 Prediabetes: Secondary | ICD-10-CM

## 2019-04-16 NOTE — Telephone Encounter (Signed)
Routing to Metformin medication refill to PCP - Last A1c 11/28/18 Hosp Enc 6.0.

## 2019-04-17 MED FILL — metFORMIN HCL 500 MG TABS: 500 | 90 days supply | Qty: 90 | Fill #0

## 2019-04-23 ENCOUNTER — Ambulatory Visit: Payer: Medicare Other | Admitting: Family Medicine

## 2019-04-25 IMAGING — MG DIGITAL SCREENING BILATERAL MAMMOGRAM WITH TOMO AND CAD
8 series · 8 of 24 positions shown · non-contrast
Comparison: None.

ACR Breast Density Category a: The breast tissue is almost entirely
fatty.

CLINICAL DATA: Screening.

EXAM:
DIGITAL SCREENING BILATERAL MAMMOGRAM WITH TOMO AND CAD

[R CC synth-2D]
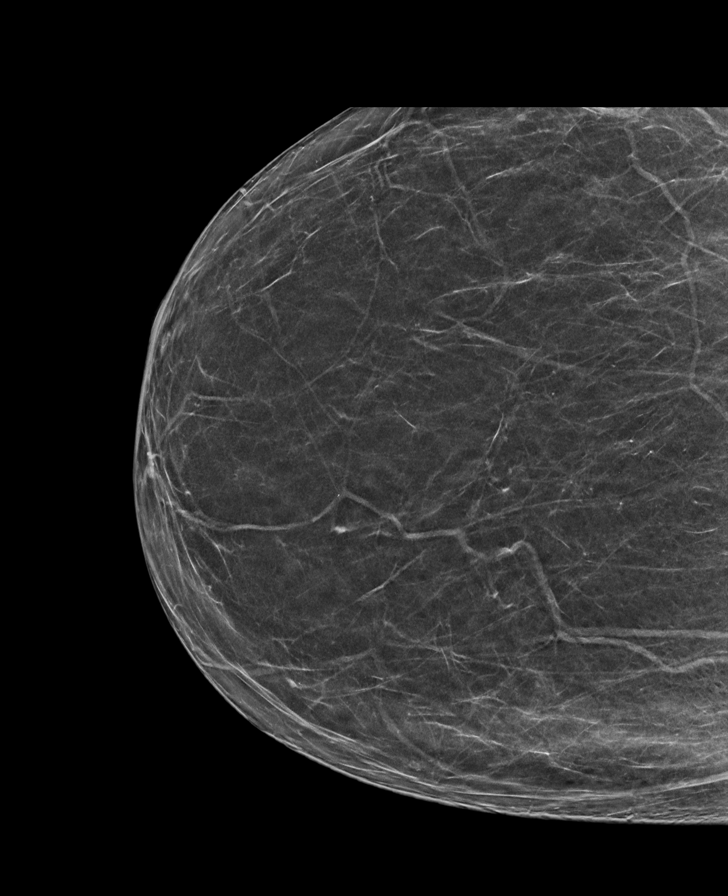

[L CC synth-2D]
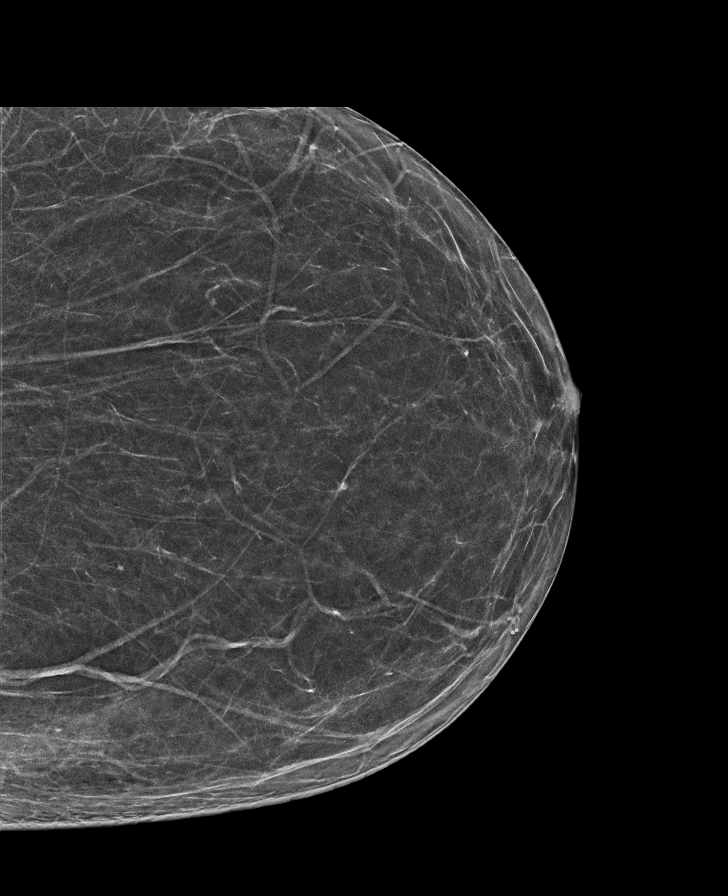

[R MLO synth-2D]
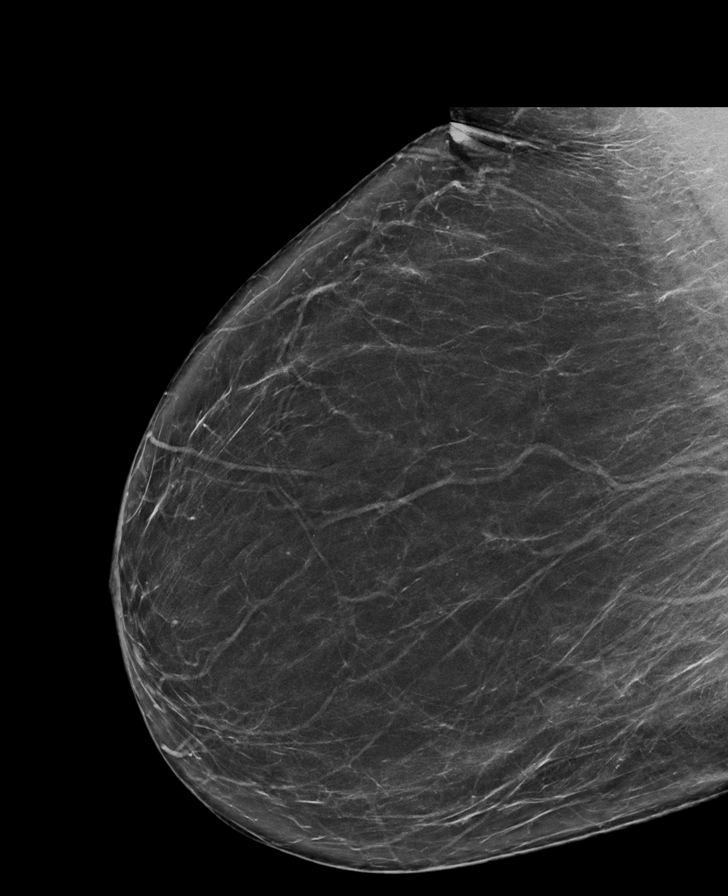

[L MLO synth-2D]
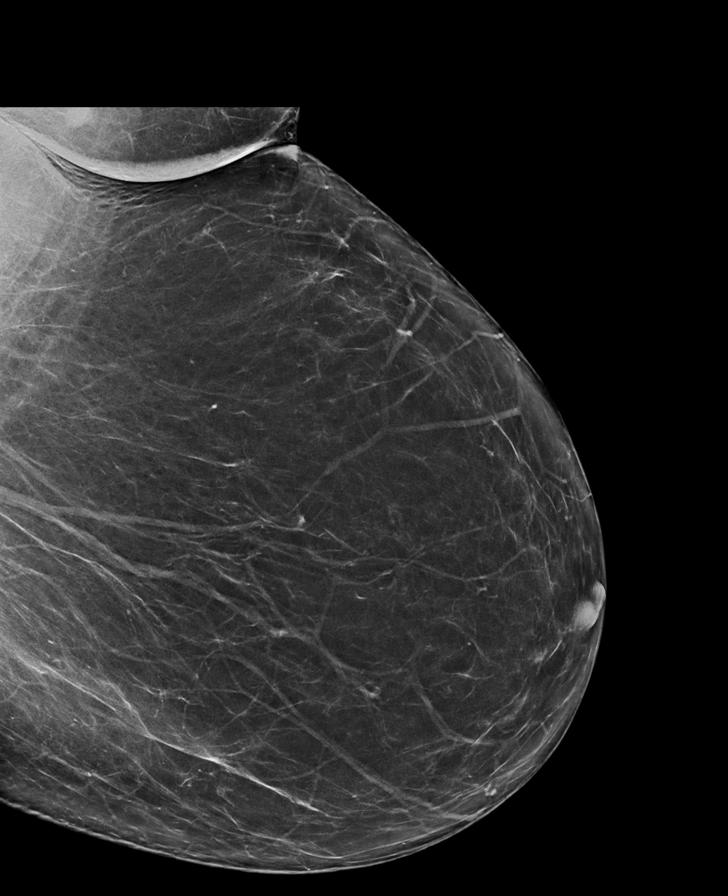

[L MLO tomo · tomo slice 43/85.0]
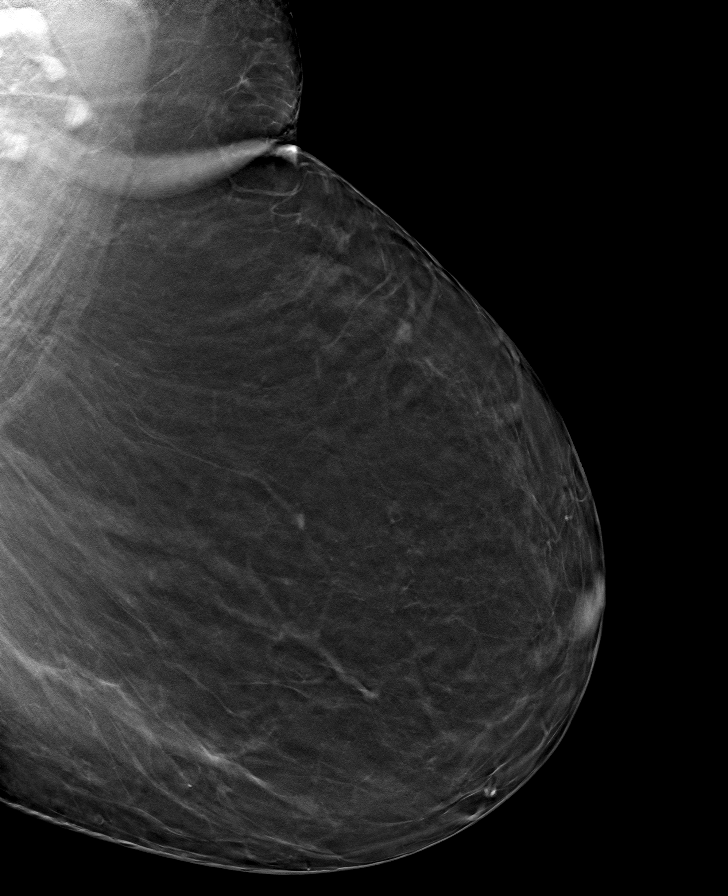

[L CC tomo · tomo slice 34/67.0]
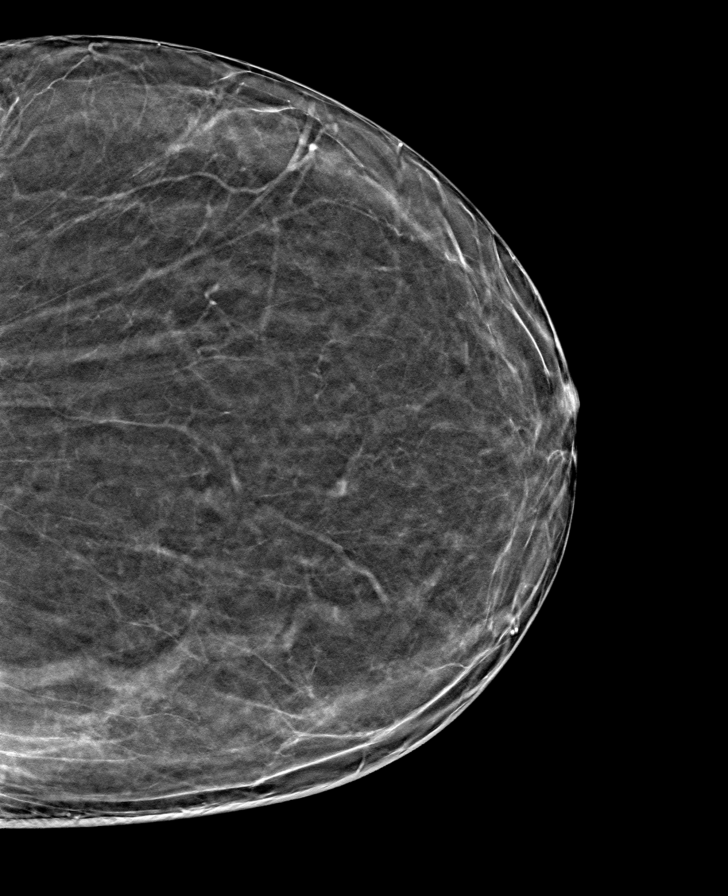

[R MLO tomo · tomo slice 44/87.0]
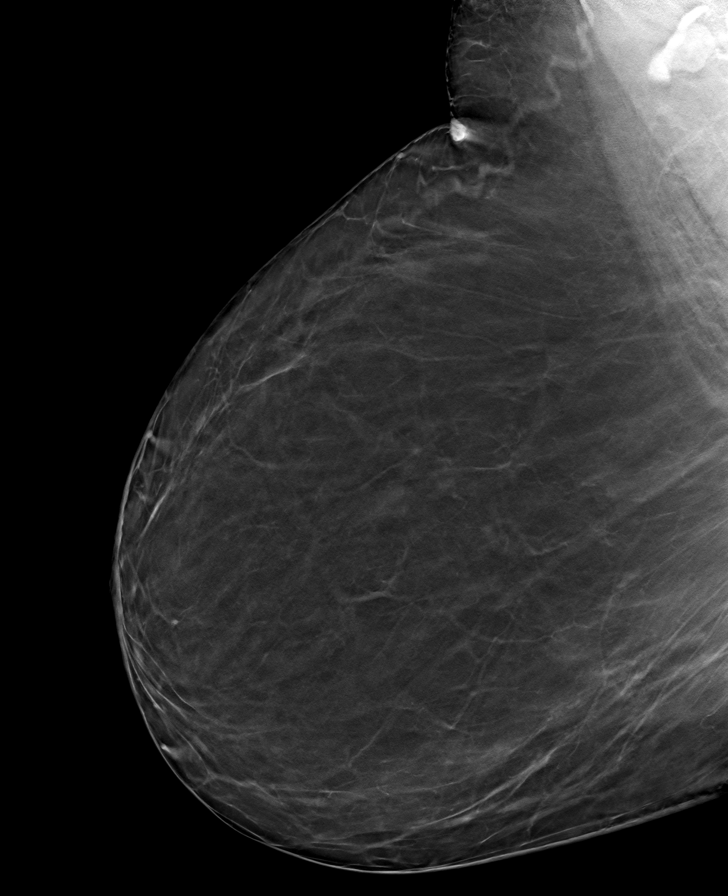

[R CC tomo · tomo slice 35/70.0]
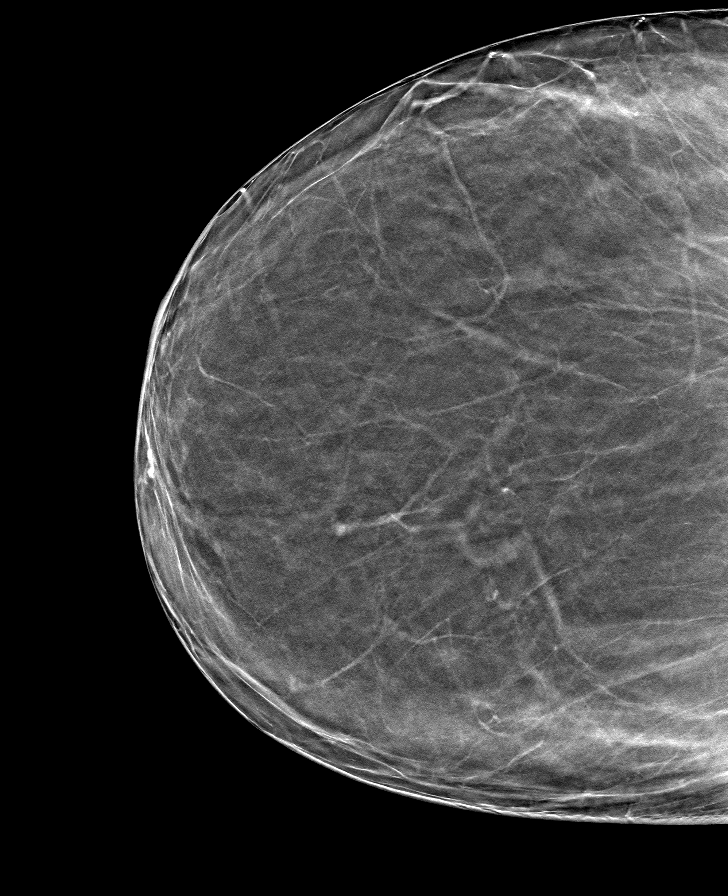

[8 of 24 positions shown; findings below may reference images not displayed]

FINDINGS: There are no findings suspicious for malignancy. Images were
processed with CAD.
IMPRESSION: No mammographic evidence of malignancy. A result letter of this
screening mammogram will be mailed directly to the patient.

RECOMMENDATION:
Screening mammogram in one year. (Code:[id number])

BI-RADS CATEGORY  1: Negative.

## 2019-04-26 MED FILL — MELOXICAM 7.5 MG TABLET: 7.5 | 30 days supply | Qty: 30 | Fill #0

## 2019-05-05 ENCOUNTER — Ambulatory Visit: Payer: Self-pay

## 2019-05-07 ENCOUNTER — Ambulatory Visit (INDEPENDENT_AMBULATORY_CARE_PROVIDER_SITE_OTHER): Payer: Medicare Other | Admitting: Family Medicine

## 2019-05-07 ENCOUNTER — Other Ambulatory Visit: Payer: Self-pay

## 2019-05-07 DIAGNOSIS — I10 Essential (primary) hypertension: Secondary | ICD-10-CM | POA: Diagnosis not present

## 2019-05-07 DIAGNOSIS — H33322 Round hole, left eye: Secondary | ICD-10-CM | POA: Insufficient documentation

## 2019-05-07 DIAGNOSIS — H04123 Dry eye syndrome of bilateral lacrimal glands: Secondary | ICD-10-CM | POA: Insufficient documentation

## 2019-05-07 DIAGNOSIS — H524 Presbyopia: Secondary | ICD-10-CM | POA: Diagnosis not present

## 2019-05-07 DIAGNOSIS — H52203 Unspecified astigmatism, bilateral: Secondary | ICD-10-CM | POA: Diagnosis not present

## 2019-05-07 DIAGNOSIS — H2513 Age-related nuclear cataract, bilateral: Secondary | ICD-10-CM | POA: Diagnosis not present

## 2019-05-07 DIAGNOSIS — H5203 Hypermetropia, bilateral: Secondary | ICD-10-CM | POA: Diagnosis not present

## 2019-05-08 LAB — BASIC METABOLIC PANEL
BUN/Creatinine Ratio: 16 (ref 12–28)
BUN: 16 mg/dL (ref 8–27)
CO2: 24 mmol/L (ref 20–29)
Calcium: 9.5 mg/dL (ref 8.7–10.3)
Chloride: 104 mmol/L (ref 96–106)
Creatinine, Ser: 1 mg/dL (ref 0.57–1.00)
GFR calc Af Amer: 67 mL/min/{1.73_m2} (ref >59)
GFR calc non Af Amer: 58 mL/min/{1.73_m2} — ABNORMAL LOW (ref >59)
Glucose: 88 mg/dL (ref 65–99)
Potassium: 4.2 mmol/L (ref 3.5–5.2)
Sodium: 141 mmol/L (ref 134–144)

## 2019-05-14 ENCOUNTER — Other Ambulatory Visit: Payer: Self-pay | Admitting: Family Medicine

## 2019-05-14 DIAGNOSIS — K30 Functional dyspepsia: Secondary | ICD-10-CM

## 2019-05-14 MED FILL — OMEPRAZOLE 20 MG CPDR: 20 | 90 days supply | Qty: 90 | Fill #0

## 2019-05-28 ENCOUNTER — Other Ambulatory Visit: Payer: Self-pay | Admitting: Family Medicine

## 2019-05-28 MED FILL — LOSARTAN POTASSIUM 50 MG TA: 50 | 90 days supply | Qty: 135 | Fill #0

## 2019-05-28 NOTE — Telephone Encounter (Signed)
Please advise is Dr. Nolon Rod would like to prescribe

## 2019-05-30 MED FILL — PROAIR HFA 90 MCG INHALER: 108 (90 BAS | 17 days supply | Qty: 9 | Fill #0

## 2019-06-19 DIAGNOSIS — J310 Chronic rhinitis: Secondary | ICD-10-CM | POA: Diagnosis not present

## 2019-06-19 DIAGNOSIS — J338 Other polyp of sinus: Secondary | ICD-10-CM | POA: Diagnosis not present

## 2019-07-09 ENCOUNTER — Other Ambulatory Visit: Payer: Self-pay | Admitting: Family Medicine

## 2019-07-09 MED FILL — ATORVASTATIN 20 MG TABLET: 20 | 90 days supply | Qty: 90 | Fill #0

## 2019-07-09 MED FILL — metFORMIN HCL 500 MG TABS: 500 | 90 days supply | Qty: 90 | Fill #1

## 2019-07-17 ENCOUNTER — Encounter: Payer: Self-pay | Admitting: Orthopaedic Surgery

## 2019-07-17 ENCOUNTER — Ambulatory Visit: Payer: Self-pay

## 2019-07-17 ENCOUNTER — Ambulatory Visit (INDEPENDENT_AMBULATORY_CARE_PROVIDER_SITE_OTHER): Payer: Medicare Other | Admitting: Orthopaedic Surgery

## 2019-07-17 ENCOUNTER — Other Ambulatory Visit: Payer: Self-pay

## 2019-07-17 VITALS — BP 176/113 | HR 67 | Ht 63.0 in | Wt 185.0 lb

## 2019-07-17 DIAGNOSIS — M1612 Unilateral primary osteoarthritis, left hip: Secondary | ICD-10-CM | POA: Diagnosis not present

## 2019-07-17 DIAGNOSIS — M25561 Pain in right knee: Secondary | ICD-10-CM | POA: Diagnosis not present

## 2019-07-17 DIAGNOSIS — M170 Bilateral primary osteoarthritis of knee: Secondary | ICD-10-CM

## 2019-07-17 DIAGNOSIS — G8929 Other chronic pain: Secondary | ICD-10-CM | POA: Diagnosis not present

## 2019-07-17 MED ORDER — LIDOCAINE HCL 1 % IJ SOLN
2.0000 mL | INTRAMUSCULAR | Status: AC | PRN
  Administered 2019-07-17: 2 mL

## 2019-07-17 MED ORDER — METHYLPREDNISOLONE ACETATE 40 MG/ML IJ SUSP
80.0000 mg | INTRAMUSCULAR | Status: AC | PRN
  Administered 2019-07-17: 10:00:00 80 mg via INTRA_ARTICULAR

## 2019-07-17 MED ORDER — BUPIVACAINE HCL 0.5 % IJ SOLN
2.0000 mL | INTRAMUSCULAR | Status: AC | PRN
  Administered 2019-07-17: 2 mL via INTRA_ARTICULAR

## 2019-07-17 NOTE — Progress Notes (Signed)
Office Visit Note   Patient: Barbara Adkins           Date of Birth: Jul 17, 1951           MRN: [id number] Visit Date: 07/17/2019              Requested by: Forrest Moron, MD [place],  [place] [id number] PCP: Forrest Moron, MD   Assessment & Plan: Visit Diagnoses:  1. Chronic pain of right knee   2. Bilateral primary osteoarthritis of knee   3. Unilateral primary osteoarthritis, left hip     Plan: 7 months status post primary left total hip replacement doing very well without any significant issues.  Also has evidence of osteoarthritis of both knees.  She is about 6 months status post cortisone injection the left knee doing well and recently had onset of right knee pain films consistent with arthritis.  Will inject her right knee with cortisone today.  Discussed total hip precautions, need for antibiotics with any invasive procedure and will see again in 6 months or as needed.  Office visit over 30 minutes 50% of the time in counseling  Follow-Up Instructions: Return if symptoms worsen or fail to improve.   Orders:  Orders Placed This Encounter  Procedures  . Large Joint Inj: R knee  . XR KNEE 3 VIEW RIGHT   No orders of the defined types were placed in this encounter.     Procedures: Large Joint Inj: R knee on 07/17/2019 9:39 AM Indications: pain and diagnostic evaluation Details: 25 G 1.5 in needle, medial approach  Arthrogram: No  Medications: 2 mL lidocaine 1 %; 2 mL bupivacaine 0.5 %; 80 mg methylPREDNISolone acetate 40 MG/ML Aspirate: 10 mL clear and yellow Outcome: tolerated well, no immediate complications Procedure, treatment alternatives, risks and benefits explained, specific risks discussed. Consent was given by the patient. Immediately prior to procedure a time out was called to verify the correct patient, procedure, equipment, support staff and site/side marked as required. Patient was prepped and draped in the usual sterile fashion.        Clinical Data: No additional findings.   Subjective: Chief Complaint  Patient presents with  . Left Hip - Follow-up    Left THA DOS 11/28/2018  Patient presents today for a 6 month follow up on her left hip. She had a left total hip arthroplasty on 11/28/2018. Patient states that she is doing great. Her only complaint is her right knee. She received a cortisone injection in her left knee in February, but now the pain has switched to the right side. Her pain is located medially and swells. Her right knee has been hurting for two weeks.  Has occasional swelling.  Does not use any ambulatory aid  HPI  Review of Systems  Constitutional: Negative for fatigue.  HENT: Negative for ear pain.   Eyes: Negative for pain.  Respiratory: Negative for shortness of breath.   Cardiovascular: Negative for leg swelling.  Gastrointestinal: Negative for constipation and diarrhea.  Endocrine: Negative for cold intolerance and heat intolerance.  Genitourinary: Negative for difficulty urinating.  Musculoskeletal: Positive for joint swelling.  Skin: Negative for rash.  Allergic/Immunologic: Negative for food allergies.  Neurological: Negative for weakness.  Hematological: Does not bruise/bleed easily.  Psychiatric/Behavioral: Negative for sleep disturbance.     Objective: Vital Signs: BP (!) 176/113   Pulse 67   Ht 5\' 3"  (1.6 m)   Wt 185 lb (83.9 kg)   BMI  32.77 kg/m   Physical Exam Constitutional:      Appearance: She is well-developed.  Eyes:     Pupils: Pupils are equal, round, and reactive to light.  Pulmonary:     Effort: Pulmonary effort is normal.  Skin:    General: Skin is warm and dry.  Neurological:     Mental Status: She is alert and oriented to person, place, and time.  Psychiatric:        Behavior: Behavior normal.     Ortho Exam awake alert and oriented x3.  Has a very minimal limp in reference to her right knee.  No pain about her left hip and painless range of motion.   Leg lengths appear to be symmetrical.  Right knee with small effusion.  Predominately medial joint pain but some patellar crepitation.  No instability.  No distal edema.  Neurologically intact.  No back pain.  Straight leg raise negative  Specialty Comments:  No specialty comments available.  Imaging: Xr Knee 3 View Right  Result Date: 07/17/2019 Films of the right knee were obtained in 3 projections standing.  There is evidence of degenerative change in all 3 compartments but predominate in the medial compartment where there was some joint space narrowing, and small areas of subchondral sclerosis.  Alignment was neutral.  No ectopic calcification.  Films are consistent with moderate osteoarthritis    PMFS History: Patient Active Problem List   Diagnosis Date Noted  . Bilateral primary osteoarthritis of knee 07/17/2019  . Unilateral primary osteoarthritis, left knee 02/06/2019  . Status post left hip replacement 11/28/2018  . Unilateral primary osteoarthritis, left hip 11/21/2018  . Weight gain 04/17/2018  . Dyslipidemia 04/17/2018  . Prediabetes 04/17/2018  . Bronchospasm 04/17/2018  . Essential hypertension 03/20/2018  . Class 1 obesity due to excess calories with serious comorbidity and body mass index (BMI) of 34.0 to 34.9 in adult 03/20/2018  . Acute pain of left knee 03/20/2018   Past Medical History:  Diagnosis Date  . Allergy   . Arthritis   . Deviated septum   . Diabetes mellitus without complication ([facility])   . GERD (gastroesophageal reflux disease)   . Hypertension     Family History  Problem Relation Age of Onset  . Asthma Other   . Cancer Other   . Hypertension Other   . Diabetes Other   . Colon cancer Maternal Grandmother   . Breast cancer Neg Hx   . Esophageal cancer Neg Hx   . Rectal cancer Neg Hx   . Stomach cancer Neg Hx     Past Surgical History:  Procedure Laterality Date  . ETHMOIDECTOMY Right 05/17/2018   Procedure: RIGHT ETHMOIDECTOMY WITH TISSUE  REMOVAL;  Surgeon: Leta Baptist, MD;  Location: [facility];  Service: ENT;  Laterality: Right;  . FRONTAL SINUS EXPLORATION Right 05/17/2018   Procedure: RIGHT FRONTAL RECESS EXPLORATION;  Surgeon: Leta Baptist, MD;  Location: [facility];  Service: ENT;  Laterality: Right;  . MAXILLARY ANTROSTOMY Right 05/17/2018   Procedure: RIGHT MAXILLARY ANTROSTOMY WITH TISSUE REMOVAL;  Surgeon: Leta Baptist, MD;  Location: [facility];  Service: ENT;  Laterality: Right;  . NASAL SEPTOPLASTY W/ TURBINOPLASTY Bilateral 05/17/2018   Procedure: NASAL SEPTOPLASTY WITH TURBINATE REDUCTION;  Surgeon: Leta Baptist, MD;  Location: [facility];  Service: ENT;  Laterality: Bilateral;  . SINUS ENDO WITH FUSION N/A 05/17/2018   Procedure: SINUS ENDO WITH FUSION;  Surgeon: Leta Baptist, MD;  Location:  [facility];  Service: ENT;  Laterality: N/A;  . SPHENOIDECTOMY Right 05/17/2018   Procedure: RIGHT SPHENOIDECTOMY WITH TISSUE REMOVAL;  Surgeon: Leta Baptist, MD;  Location: [facility];  Service: ENT;  Laterality: Right;  . 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   Procedure: TOTAL HIP ARTHROPLASTY;  Surgeon: Garald Balding, MD;  Location: [facility];  Service: Orthopedics;  Laterality: Left;  . TUBAL LIGATION     Social History   Occupational History  . Not on file  Tobacco Use  . Smoking status: Never Smoker  . Smokeless tobacco: Never Used  Substance and Sexual Activity  . Alcohol use: No  . Drug use: Never  . Sexual activity: Not on file

## 2019-08-01 ENCOUNTER — Ambulatory Visit: Payer: Medicare Other | Admitting: Orthopaedic Surgery

## 2019-08-06 ENCOUNTER — Ambulatory Visit: Payer: Self-pay | Admitting: Orthopaedic Surgery

## 2019-08-21 ENCOUNTER — Ambulatory Visit (INDEPENDENT_AMBULATORY_CARE_PROVIDER_SITE_OTHER): Payer: Medicare Other | Admitting: Family Medicine

## 2019-08-21 ENCOUNTER — Other Ambulatory Visit: Payer: Self-pay

## 2019-08-21 DIAGNOSIS — Z23 Encounter for immunization: Secondary | ICD-10-CM | POA: Diagnosis not present

## 2019-08-24 ENCOUNTER — Other Ambulatory Visit: Payer: Self-pay | Admitting: Family Medicine

## 2019-08-24 DIAGNOSIS — K30 Functional dyspepsia: Secondary | ICD-10-CM

## 2019-08-24 MED FILL — OMEPRAZOLE 20 MG CAPSULE DR: 20 | 90 days supply | Qty: 90 | Fill #0

## 2019-09-06 ENCOUNTER — Other Ambulatory Visit: Payer: Self-pay | Admitting: Family Medicine

## 2019-09-06 DIAGNOSIS — I10 Essential (primary) hypertension: Secondary | ICD-10-CM

## 2019-09-06 DIAGNOSIS — R7303 Prediabetes: Secondary | ICD-10-CM

## 2019-09-06 NOTE — Telephone Encounter (Signed)
Requested medication (s) are due for refill today: yes  Requested medication (s) are on the active medication list: yes  Last refill:  05/28/2019  Future visit scheduled: yes  Notes to clinic:  Review for refill   Requested Prescriptions  Pending Prescriptions Disp Refills   losartan (COZAAR) 50 MG tablet [Pharmacy Med Name: LOSARTAN POTASSIUM 50 MG TA 50 TAB] 135 tablet 0    Sig: TAKE 1 & 1/2 TABLETS (75 MG TOTAL) BY MOUTH DAILY.     Cardiovascular:  Angiotensin Receptor Blockers Failed - 09/06/2019  9:47 AM      Failed - Last BP in normal range    BP Readings from Last 1 Encounters:  07/17/19 (!) 176/113         Failed - Valid encounter within last 6 months    Recent Outpatient Visits          2 weeks ago Need for prophylactic vaccination and inoculation against influenza   Primary Care at [facility], Arlie Solomons, MD   4 months ago Essential hypertension   Primary Care at [facility], Arlie Solomons, MD   6 months ago Essential hypertension   Primary Care at [facility], Arlie Solomons, MD   10 months ago Encounter for [employer] annual wellness exam   Primary Care at [facility], [place] A, MD   1 year ago Essential hypertension   Primary Care at [place], MD      Future Appointments            In 2 weeks Forrest Moron, MD Primary Care at [place], [place] in normal range and within 180 days    Creatinine, Ser  Date Value Ref Range Status  05/07/2019 1.00 0.57 - 1.00 mg/dL Final         Passed - K in normal range and within 180 days    Potassium  Date Value Ref Range Status  05/07/2019 4.2 3.5 - 5.2 mmol/L Final         Passed - Patient is not pregnant       metFORMIN (GLUCOPHAGE) 500 MG tablet [Pharmacy Med Name: metFORMIN HCL 500 MG TABS 500 TAB] 90 tablet 1    Sig: TAKE 1 TABLET (500 MG TOTAL) BY MOUTH DAILY WITH BREAKFAST.     Endocrinology:  Diabetes - Biguanides Failed - 09/06/2019  9:47 AM      Failed - HBA1C is  between 0 and 7.9 and within 180 days    Hgb A1c MFr Bld  Date Value Ref Range Status  11/28/2018 6.0 (H) 4.8 - 5.6 % Final    Comment:    (NOTE) Pre diabetes:          5.7%-6.4% Diabetes:              >6.4% Glycemic control for   <7.0% adults with diabetes          Failed - Valid encounter within last 6 months    Recent Outpatient Visits          2 weeks ago Need for prophylactic vaccination and inoculation against influenza   Primary Care at [place], MD   4 months ago Essential hypertension   Primary Care at [place], MD   6 months ago Essential hypertension   Primary Care at [facility], Arlie Solomons, MD   10 months ago Encounter for [employer] annual wellness  exam   Primary Care at [facility], Arlie Solomons, MD   1 year ago Essential hypertension   Primary Care at [place], [place]      Future Appointments            In 2 weeks Forrest Moron, MD Primary Care at [place], [place] in normal range and within 360 days    Creatinine, Ser  Date Value Ref Range Status  05/07/2019 1.00 0.57 - 1.00 mg/dL Final         Passed - eGFR in normal range and within 360 days    GFR calc Af Amer  Date Value Ref Range Status  05/07/2019 67 >59 mL/min/1.73 Final   GFR calc non Af Amer  Date Value Ref Range Status  05/07/2019 58 (L) >59 mL/min/1.73 Final          atorvastatin (LIPITOR) 20 MG tablet [Pharmacy Med Name: ATORVASTATIN 20 MG TABLET 20 TAB] 90 tablet 0    Sig: TAKE 1 TABLET BY MOUTH ONCE A DAY     Cardiovascular:  Antilipid - Statins Passed - 09/06/2019  9:47 AM      Passed - Total Cholesterol in normal range and within 360 days    Cholesterol, Total  Date Value Ref Range Status  10/23/2018 189 100 - 199 mg/dL Final         Passed - LDL in normal range and within 360 days    LDL Calculated  Date Value Ref Range Status  10/23/2018 96 0 - 99 mg/dL Final         Passed - HDL in normal range and within  360 days    HDL  Date Value Ref Range Status  10/23/2018 75 >39 mg/dL Final         Passed - Triglycerides in normal range and within 360 days    Triglycerides  Date Value Ref Range Status  10/23/2018 91 0 - 149 mg/dL Final         Passed - Patient is not pregnant      Passed - Valid encounter within last 12 months    Recent Outpatient Visits          2 weeks ago Need for prophylactic vaccination and inoculation against influenza   Primary Care at [facility], Arlie Solomons, MD   4 months ago Essential hypertension   Primary Care at [facility], Arlie Solomons, MD   6 months ago Essential hypertension   Primary Care at [facility], Arlie Solomons, MD   10 months ago Encounter for Medicare annual wellness exam   Primary Care at [facility], Arlie Solomons, MD   1 year ago Essential hypertension   Primary Care at [facility], Arlie Solomons, MD      Future Appointments            In 2 weeks Forrest Moron, MD Primary Care at [place], [facility]

## 2019-09-07 MED FILL — LOSARTAN POTASSIUM 50 MG TA: 50 | 90 days supply | Qty: 135 | Fill #0

## 2019-09-25 ENCOUNTER — Encounter: Payer: Self-pay | Admitting: Family Medicine

## 2019-09-25 ENCOUNTER — Ambulatory Visit (INDEPENDENT_AMBULATORY_CARE_PROVIDER_SITE_OTHER): Payer: Medicare Other | Admitting: Family Medicine

## 2019-09-25 ENCOUNTER — Other Ambulatory Visit: Payer: Self-pay

## 2019-09-25 VITALS — BP 135/81 | HR 78 | Temp 98.3°F | Wt 181.8 lb

## 2019-09-25 DIAGNOSIS — Z23 Encounter for immunization: Secondary | ICD-10-CM | POA: Diagnosis not present

## 2019-09-25 DIAGNOSIS — Z0001 Encounter for general adult medical examination with abnormal findings: Secondary | ICD-10-CM | POA: Diagnosis not present

## 2019-09-25 DIAGNOSIS — I10 Essential (primary) hypertension: Secondary | ICD-10-CM

## 2019-09-25 DIAGNOSIS — E785 Hyperlipidemia, unspecified: Secondary | ICD-10-CM

## 2019-09-25 DIAGNOSIS — R7303 Prediabetes: Secondary | ICD-10-CM | POA: Diagnosis not present

## 2019-09-25 DIAGNOSIS — Z0000 Encounter for general adult medical examination without abnormal findings: Secondary | ICD-10-CM

## 2019-09-25 MED ORDER — MELOXICAM 7.5 MG PO TABS: 7.5000 mg | ORAL_TABLET | Freq: Every day | ORAL | 3 refills | Status: DC

## 2019-09-25 MED FILL — MELOXICAM 7.5 MG TABLET: 7.5 | 30 days supply | Qty: 30 | Fill #0

## 2019-09-25 NOTE — Patient Instructions (Addendum)
We recommend that you schedule a mammogram for breast cancer screening. Typically, you do not need a referral to do this. Please contact a local imaging center to schedule your mammogram.   [facility] ([place]) - [phone] or [phone]    If you have lab work done today you will be contacted with your lab results within the next 2 weeks.  If you have not heard from [place] then please contact us. The fastest way to get your results is to register for My Chart.   IF you received an x-ray today, you will receive an invoice from [facility]. Please contact [facility] at [phone] with questions or concerns regarding your invoice.   IF you received labwork today, you will receive an invoice from [facility]. Please contact [facility] at [phone] with questions or concerns regarding your invoice.   Our billing staff will not be able to assist you with questions regarding bills from these companies.  You will be contacted with the lab results as soon as they are available. The fastest way to get your results is to activate your My Chart account. Instructions are located on the last page of this paperwork. If you have not heard from [place] regarding the results in 2 weeks, please contact this office.      Health Maintenance After Age 2 After age 12, you are at a higher risk for certain long-term diseases and infections as well as injuries from falls. Falls are a major cause of broken bones and head injuries in people who are older than age 52. Getting regular preventive care can help to keep you healthy and well. Preventive care includes getting regular testing and making lifestyle changes as recommended by your health care provider. Talk with your health care provider about:  Which screenings and tests you should have. A screening is a test that checks for a disease when you have no symptoms.  A diet and exercise plan that is right for you. What should  I know about screenings and tests to prevent falls? Screening and testing are the best ways to find a health problem early. Early diagnosis and treatment give you the best chance of managing medical conditions that are common after age 30. Certain conditions and lifestyle choices may make you more likely to have a fall. Your health care provider may recommend:  Regular vision checks. Poor vision and conditions such as cataracts can make you more likely to have a fall. If you wear glasses, make sure to get your prescription updated if your vision changes.  Medicine review. Work with your health care provider to regularly review all of the medicines you are taking, including over-the-counter medicines. Ask your health care provider about any side effects that may make you more likely to have a fall. Tell your health care provider if any medicines that you take make you feel dizzy or sleepy.  Osteoporosis screening. Osteoporosis is a condition that causes the bones to get weaker. This can make the bones weak and cause them to break more easily.  Blood pressure screening. Blood pressure changes and medicines to control blood pressure can make you feel dizzy.  Strength and balance checks. Your health care provider may recommend certain tests to check your strength and balance while standing, walking, or changing positions.  Foot health exam. Foot pain and numbness, as well as not wearing proper footwear, can make you more likely to have a fall.  Depression screening. You may be more  likely to have a fall if you have a fear of falling, feel emotionally low, or feel unable to do activities that you used to do.  Alcohol use screening. Using too much alcohol can affect your balance and may make you more likely to have a fall. What actions can I take to lower my risk of falls? General instructions  Talk with your health care provider about your risks for falling. Tell your health care provider if: ? You  fall. Be sure to tell your health care provider about all falls, even ones that seem minor. ? You feel dizzy, sleepy, or off-balance.  Take over-the-counter and prescription medicines only as told by your health care provider. These include any supplements.  Eat a healthy diet and maintain a healthy weight. A healthy diet includes low-fat dairy products, low-fat (lean) meats, and fiber from whole grains, beans, and lots of fruits and vegetables. Home safety  Remove any tripping hazards, such as rugs, cords, and clutter.  Install safety equipment such as grab bars in bathrooms and safety rails on stairs.  Keep rooms and walkways well-lit. Activity   Follow a regular exercise program to stay fit. This will help you maintain your balance. Ask your health care provider what types of exercise are appropriate for you.  If you need a cane or walker, use it as recommended by your health care provider.  Wear supportive shoes that have nonskid soles. Lifestyle  Do not drink alcohol if your health care provider tells you not to drink.  If you drink alcohol, limit how much you have: ? 0-1 drink a day for women. ? 0-2 drinks a day for men.  Be aware of how much alcohol is in your drink. In the [place] one drink equals one typical bottle of beer (12 oz), one-half glass of wine (5 oz), or one shot of hard liquor (1 oz).  Do not use any products that contain nicotine or tobacco, such as cigarettes and e-cigarettes. If you need help quitting, ask your health care provider. Summary  Having a healthy lifestyle and getting preventive care can help to protect your health and wellness after age 48.  Screening and testing are the best way to find a health problem early and help you avoid having a fall. Early diagnosis and treatment give you the best chance for managing medical conditions that are more common for people who are older than age 68.  Falls are a major cause of broken bones and head  injuries in people who are older than age 45. Take precautions to prevent a fall at home.  Work with your health care provider to learn what changes you can make to improve your health and wellness and to prevent falls. This information is not intended to replace advice given to you by your health care provider. Make sure you discuss any questions you have with your health care provider. Document Released: 09/14/2017 Document Revised: 02/22/2019 Document Reviewed: 09/14/2017 Elsevier Patient Education  2020 [employer].

## 2019-09-25 NOTE — Progress Notes (Signed)
QUICK REFERENCE INFORMATION: The ABCs of Providing the Annual Wellness Visit  CMS.gov Medicare Learning Network  [employer] Annual Wellness Visit  Subjective:   Barbara Adkins is a 68 y.o. Female who presents for an Annual Wellness Visit.  Patient Active Problem List   Diagnosis Date Noted  . Bilateral primary osteoarthritis of knee 07/17/2019  . Unilateral primary osteoarthritis, left knee 02/06/2019  . Status post left hip replacement 11/28/2018  . Unilateral primary osteoarthritis, left hip 11/21/2018  . Weight gain 04/17/2018  . Dyslipidemia 04/17/2018  . Prediabetes 04/17/2018  . Bronchospasm 04/17/2018  . Essential hypertension 03/20/2018  . Class 1 obesity due to excess calories with serious comorbidity and body mass index (BMI) of 34.0 to 34.9 in adult 03/20/2018  . Acute pain of left knee 03/20/2018    Past Medical History:  Diagnosis Date  . Allergy   . Arthritis   . Deviated septum   . Diabetes mellitus without complication ([place])   . GERD (gastroesophageal reflux disease)   . Hypertension      Past Surgical History:  Procedure Laterality Date  . ETHMOIDECTOMY Right 05/17/2018   Procedure: RIGHT ETHMOIDECTOMY WITH TISSUE REMOVAL;  Surgeon: Leta Baptist, MD;  Location: [place];  Service: ENT;  Laterality: Right;  . FRONTAL SINUS EXPLORATION Right 05/17/2018   Procedure: RIGHT FRONTAL RECESS EXPLORATION;  Surgeon: Leta Baptist, MD;  Location: [place];  Service: ENT;  Laterality: Right;  . MAXILLARY ANTROSTOMY Right 05/17/2018   Procedure: RIGHT MAXILLARY ANTROSTOMY WITH TISSUE REMOVAL;  Surgeon: Leta Baptist, MD;  Location: [place];  Service: ENT;  Laterality: Right;  . NASAL SEPTOPLASTY W/ TURBINOPLASTY Bilateral 05/17/2018   Procedure: NASAL SEPTOPLASTY WITH TURBINATE REDUCTION;  Surgeon: Leta Baptist, MD;  Location: [place];  Service: ENT;  Laterality: Bilateral;  . SINUS ENDO WITH FUSION N/A 05/17/2018   Procedure: SINUS ENDO WITH FUSION;  Surgeon: Leta Baptist, MD;  Location: [place];  Service: ENT;  Laterality: N/A;  . SPHENOIDECTOMY Right 05/17/2018   Procedure: RIGHT SPHENOIDECTOMY WITH TISSUE REMOVAL;  Surgeon: Leta Baptist, MD;  Location: [place];  Service: ENT;  Laterality: Right;  . 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   Procedure: TOTAL HIP ARTHROPLASTY;  Surgeon: Garald Balding, MD;  Location: [place];  Service: Orthopedics;  Laterality: Left;  . TUBAL LIGATION       Outpatient Medications Prior to Visit  Medication Sig Dispense Refill  . acetaminophen (TYLENOL) 325 MG tablet Take 2 tablets (650 mg total) by mouth every 6 (six) hours.    Marland Kitchen atorvastatin (LIPITOR) 20 MG tablet TAKE 1 TABLET BY MOUTH ONCE A DAY 90 tablet 0  . Blood Pressure Monitoring (BLOOD PRESSURE KIT) DEVI 1 Device by Does not apply route 3 (three) times a week. Use to check blood pressure three times a week. ICD10 I10 1 Device 0  . cetirizine (ZYRTEC) 10 MG tablet Take 1 tablet (10 mg total) by mouth daily. 30 tablet 11  . losartan (COZAAR) 50 MG tablet TAKE 1 & 1/2 TABLETS (75 MG TOTAL) BY MOUTH DAILY. 135 tablet 0  . metFORMIN (GLUCOPHAGE) 500 MG tablet TAKE 1 TABLET (500 MG TOTAL) BY MOUTH DAILY WITH BREAKFAST. 90 tablet 1  . omeprazole (PRILOSEC) 20 MG capsule TAKE 1 CAPSULE BY MOUTH DAILY 90 capsule 0  . PROAIR HFA 108 (90 Base) MCG/ACT inhaler INHALE 2 PUFFS BY MOUTH INTO THE LUNGS EVERY 4 HOURS AS NEEDED FOR WHEEZING, COUGH,  OR SHORTNESS OF BREATH 8.5 g 2  . meloxicam (MOBIC) 7.5 MG tablet Take 1 tablet (7.5 mg total) by mouth daily. 30 tablet 3  . methocarbamol (ROBAXIN) 500 MG tablet Take 1 tablet (500 mg total) by mouth 2 (two) times daily as needed for muscle spasms. 30 tablet 0   No facility-administered medications prior to visit.     No Known Allergies   Family History  Problem Relation Age of Onset  . Asthma Other   . Cancer Other   . Hypertension Other   .  Diabetes Other   . Colon cancer Maternal Grandmother   . Breast cancer Neg Hx   . Esophageal cancer Neg Hx   . Rectal cancer Neg Hx   . Stomach cancer Neg Hx      Social History   Socioeconomic History  . Marital status: Divorced    Spouse name: Not on file  . Number of children: Not on file  . Years of education: Not on file  . Highest education level: Not on file  Occupational History  . Not on file  Social Needs  . Financial resource strain: Not on file  . Food insecurity    Worry: Not on file    Inability: Not on file  . Transportation needs    Medical: Not on file    Non-medical: Not on file  Tobacco Use  . Smoking status: Never Smoker  . Smokeless tobacco: Never Used  Substance and Sexual Activity  . Alcohol use: No  . Drug use: Never  . Sexual activity: Not on file  Lifestyle  . Physical activity    Days per week: Not on file    Minutes per session: Not on file  . Stress: Not on file  Relationships  . Social Herbalist on phone: Not on file    Gets together: Not on file    Attends religious service: Not on file    Active member of club or organization: Not on file    Attends meetings of clubs or organizations: Not on file    Relationship status: Not on file  Other Topics Concern  . Not on file  Social History Narrative  . Not on file      Recent Hospitalizations? No  Health Habits: Current exercise activities include: walking Exercise: 2 times/week. Diet: in general, a "healthy" diet    Alcohol intake:   Health Risk Assessment: The patient has completed a Health Risk Assessment. This has been reveiwed with them and has been scanned into the [facility] system as an attached document.  Current Medical Providers and Suppliers: [facility] Patient Care Team: Forrest Moron, MD as PCP - General (Internal Medicine) Garald Balding, MD as Consulting Physician (Orthopedic Surgery) Future Appointments  Date Time Provider [place]   03/25/2020  9:00 AM Forrest Moron, MD PCP-PCP [facility]     Age-appropriate Screening Schedule: The list below includes current immunization status and future screening recommendations based on patient's age. Orders for these recommended tests are listed in the plan section. The patient has been provided with a written plan. Immunization History  Administered Date(s) Administered  . Fluad Quad(high Dose 65+) 08/21/2019  . Influenza, High Dose Seasonal PF 08/12/2018  . Influenza,inj,Quad PF,6+ Mos 11/01/2017  . Pneumococcal Conjugate-13 09/25/2019  . Pneumococcal Polysaccharide-23 08/28/2018  . Tdap 08/30/2018, 08/30/2018    Health Maintenance reviewed -  patient asked to schedule her pap smear, patient asked to schedule her mammogram  Depression Screen-PHQ2/9 completed today  Depression screen [facility] 2/9 09/25/2019 10/23/2018 08/28/2018 08/12/2018 06/21/2018  Decreased Interest 0 0 0 0 0  Down, Depressed, Hopeless 0 0 0 0 0  PHQ - 2 Score 0 0 0 0 0       Depression Severity and Treatment Recommendations:  0-4= None  5-9= Mild / Treatment: Support, educate to call if worse; return in one month  10-14= Moderate / Treatment: Support, watchful waiting; Antidepressant or Psycotherapy  15-19= Moderately severe / Treatment: Antidepressant OR Psychotherapy  >= 20 = Major depression, severe / Antidepressant AND Psychotherapy  Functional Status Survey:   Is the patient deaf or have difficulty hearing?: No Does the patient have difficulty seeing, even when wearing glasses/contacts?: No Does the patient have difficulty concentrating, remembering, or making decisions?: No Does the patient have difficulty walking or climbing stairs?: Yes(inflammation on the knee at the moment) Does the patient have difficulty dressing or bathing?: No Does the patient have difficulty doing errands alone such as visiting a doctor's office or shopping?: No  Hearing Evaluation: 1. Do you have trouble hearing the  television when others do not?   No 2. Do you have to strain to hear/understand conversations? No   Advanced Care Planning: 1. Patient has executed an Advance Directive: No 2. If no, patient was given the opportunity to execute an Advance Directive today? No  Cognitive Assessment: Does the patient have evidence of cognitive impairment? No The patient does not have any evidence of any cognitive problems and denies any  change in mood/affect, appearance, speech, memory or motor skills.  Identification of Risk Factors: Risk factors include: hypertension  ROS Review of Systems  Constitutional: Negative for activity change, appetite change, chills and fever.  HENT: Negative for congestion, nosebleeds, trouble swallowing and voice change.   Respiratory: Negative for cough, shortness of breath and wheezing.   Gastrointestinal: Negative for diarrhea, nausea and vomiting.  Genitourinary: Negative for difficulty urinating, dysuria, flank pain and hematuria.  Musculoskeletal: Negative for back pain, joint swelling and neck pain.  Neurological: Negative for dizziness, speech difficulty, light-headedness and numbness.  See HPI. All other review of systems negative.    Objective:   Vitals:   09/25/19 1004 09/25/19 1008  BP: (!) 152/85 135/81  Pulse: 78   Temp: 98.3 F (36.8 C)   TempSrc: Oral   SpO2: 97%   Weight: 181 lb 12.8 oz (82.5 kg)    Wt Readings from Last 3 Encounters:  09/25/19 181 lb 12.8 oz (82.5 kg)  07/17/19 185 lb (83.9 kg)  02/06/19 185 lb (83.9 kg)    Body mass index is 32.2 kg/m.  BP 135/81 (BP Location: Right Arm, Patient Position: Sitting, Cuff Size: Large)   Pulse 78   Temp 98.3 F (36.8 C) (Oral)   Wt 181 lb 12.8 oz (82.5 kg)   SpO2 97%   BMI 32.20 kg/m   General Appearance:    Alert, cooperative, no distress, appears stated age  Head:    Normocephalic, without obvious abnormality, atraumatic  Eyes:    PERRL, conjunctiva/corneas clear, EOM's intact,  fundi    benign, both eyes  Ears:    Normal TM's and external ear canals, both ears  Nose:   Nares normal, septum midline, mucosa normal, no drainage    or sinus tenderness  Throat:   Lips, mucosa, and tongue normal; teeth and gums normal  Neck:   Supple, symmetrical, trachea midline, no adenopathy;    thyroid:  no enlargement/tenderness/nodules; no  carotid   bruit or JVD  Back:     Symmetric, no curvature, ROM normal, no CVA tenderness  Lungs:     Clear to auscultation bilaterally, respirations unlabored  Chest Wall:    No tenderness or deformity   Heart:    Regular rate and rhythm, S1 and S2 normal, no murmur, rub   or gallop  Breast Exam:    No tenderness, masses, or nipple abnormality  Abdomen:     Soft, non-tender, bowel sounds active all four quadrants,    no masses, no organomegaly  Extremities:   Extremities normal, atraumatic, no cyanosis or edema  Pulses:   2+ and symmetric all extremities  Skin:   Skin color, texture, turgor normal, no rashes or lesions  Lymph nodes:   Cervical, supraclavicular, and axillary nodes normal  Neurologic:   CNII-XII intact, normal strength, sensation and reflexes    throughout      Assessment/Plan:   Patient Self-Management and Personalized Health Advice The patient has been provided with information about:  reduce salt in diet and cooking  During the course of the visit the patient was educated and counseled about appropriate screening and preventive services including:   lab testing as noted in orders section     Body mass index is 32.2 kg/m. Discussed the patient's BMI with her. The BMI BMI is not in the acceptable range; BMI management plan is completed  Hillary was seen today for annual exam.  Diagnoses and all orders for this visit:  Encounter for Medicare annual wellness exam- Women's Health Maintenance Plan Advised monthly breast exam and annual mammogram Advised dental exam every six months Discussed stress management  Discussed pap smear screening guidelines  Need for prophylactic vaccination against Streptococcus pneumoniae (pneumococcus) -     Pneumococcal conjugate vaccine 13-valent  Essential hypertension- bp normally well controlled, on recheck bp improved Continue current meds -     Comprehensive metabolic panel -     meloxicam (MOBIC) 7.5 MG tablet; Take 1 tablet (7.5 mg total) by mouth daily.  Dyslipidemia- screening -     Comprehensive metabolic panel -     Lipid Panel  Prediabetes- likely improved since pt has been very aware -     Hemoglobin A1c      Return in about 6 months (around 03/24/2020).  Future Appointments  Date Time Provider [place]  03/25/2020  9:00 AM Forrest Moron, MD PCP-PCP [facility]    Patient Instructions    We recommend that you schedule a mammogram for breast cancer screening. Typically, you do not need a referral to do this. Please contact a local imaging center to schedule your mammogram.   [facility] - [phone] or [phone]    If you have lab work done today you will be contacted with your lab results within the next 2 weeks.  If you have not heard from [place] then please contact us. The fastest way to get your results is to register for My Chart.   IF you received an x-ray today, you will receive an invoice from [facility]. Please contact [facility] at [phone] with questions or concerns regarding your invoice.   IF you received labwork today, you will receive an invoice from [place]. Please contact [facility] at [phone] with questions or concerns regarding your invoice.   Our billing staff will not be able to assist you with questions regarding bills from these companies.  You will be contacted with the lab  results as soon as they are available. The fastest way to get your results is to activate your My Chart account. Instructions are located on the last page of this paperwork.  If you have not heard from [place] regarding the results in 2 weeks, please contact this office.      Health Maintenance After Age 67 After age 54, you are at a higher risk for certain long-term diseases and infections as well as injuries from falls. Falls are a major cause of broken bones and head injuries in people who are older than age 71. Getting regular preventive care can help to keep you healthy and well. Preventive care includes getting regular testing and making lifestyle changes as recommended by your health care provider. Talk with your health care provider about:  Which screenings and tests you should have. A screening is a test that checks for a disease when you have no symptoms.  A diet and exercise plan that is right for you. What should I know about screenings and tests to prevent falls? Screening and testing are the best ways to find a health problem early. Early diagnosis and treatment give you the best chance of managing medical conditions that are common after age 66. Certain conditions and lifestyle choices may make you more likely to have a fall. Your health care provider may recommend:  Regular vision checks. Poor vision and conditions such as cataracts can make you more likely to have a fall. If you wear glasses, make sure to get your prescription updated if your vision changes.  Medicine review. Work with your health care provider to regularly review all of the medicines you are taking, including over-the-counter medicines. Ask your health care provider about any side effects that may make you more likely to have a fall. Tell your health care provider if any medicines that you take make you feel dizzy or sleepy.  Osteoporosis screening. Osteoporosis is a condition that causes the bones to get weaker. This can make the bones weak and cause them to break more easily.  Blood pressure screening. Blood pressure changes and medicines to control blood pressure can make you feel  dizzy.  Strength and balance checks. Your health care provider may recommend certain tests to check your strength and balance while standing, walking, or changing positions.  Foot health exam. Foot pain and numbness, as well as not wearing proper footwear, can make you more likely to have a fall.  Depression screening. You may be more likely to have a fall if you have a fear of falling, feel emotionally low, or feel unable to do activities that you used to do.  Alcohol use screening. Using too much alcohol can affect your balance and may make you more likely to have a fall. What actions can I take to lower my risk of falls? General instructions  Talk with your health care provider about your risks for falling. Tell your health care provider if: ? You fall. Be sure to tell your health care provider about all falls, even ones that seem minor. ? You feel dizzy, sleepy, or off-balance.  Take over-the-counter and prescription medicines only as told by your health care provider. These include any supplements.  Eat a healthy diet and maintain a healthy weight. A healthy diet includes low-fat dairy products, low-fat (lean) meats, and fiber from whole grains, beans, and lots of fruits and vegetables. Home safety  Remove any tripping hazards, such as rugs, cords, and clutter.  Install safety equipment such  as grab bars in bathrooms and safety rails on stairs.  Keep rooms and walkways well-lit. Activity   Follow a regular exercise program to stay fit. This will help you maintain your balance. Ask your health care provider what types of exercise are appropriate for you.  If you need a cane or walker, use it as recommended by your health care provider.  Wear supportive shoes that have nonskid soles. Lifestyle  Do not drink alcohol if your health care provider tells you not to drink.  If you drink alcohol, limit how much you have: ? 0-1 drink a day for women. ? 0-2 drinks a day for men.   Be aware of how much alcohol is in your drink. In the [place] one drink equals one typical bottle of beer (12 oz), one-half glass of wine (5 oz), or one shot of hard liquor (1 oz).  Do not use any products that contain nicotine or tobacco, such as cigarettes and e-cigarettes. If you need help quitting, ask your health care provider. Summary  Having a healthy lifestyle and getting preventive care can help to protect your health and wellness after age 39.  Screening and testing are the best way to find a health problem early and help you avoid having a fall. Early diagnosis and treatment give you the best chance for managing medical conditions that are more common for people who are older than age 35.  Falls are a major cause of broken bones and head injuries in people who are older than age 81. Take precautions to prevent a fall at home.  Work with your health care provider to learn what changes you can make to improve your health and wellness and to prevent falls. This information is not intended to replace advice given to you by your health care provider. Make sure you discuss any questions you have with your health care provider. Document Released: 09/14/2017 Document Revised: 02/22/2019 Document Reviewed: 09/14/2017 Elsevier Patient Education  2020 [employer].    An after visit summary with all of these plans was given to the patient.

## 2019-09-26 LAB — HEMOGLOBIN A1C
Est. average glucose Bld gHb Est-mCnc: 126 mg/dL
Hgb A1c MFr Bld: 6 % — ABNORMAL HIGH (ref 4.8–5.6)

## 2019-09-26 LAB — COMPREHENSIVE METABOLIC PANEL
ALT: 8 IU/L (ref 0–32)
AST: 21 IU/L (ref 0–40)
Albumin/Globulin Ratio: 1.2 (ref 1.2–2.2)
Albumin: 4.3 g/dL (ref 3.8–4.8)
Alkaline Phosphatase: 91 IU/L (ref 39–117)
BUN/Creatinine Ratio: 17 (ref 12–28)
BUN: 18 mg/dL (ref 8–27)
Bilirubin Total: 0.6 mg/dL (ref 0.0–1.2)
CO2: 26 mmol/L (ref 20–29)
Calcium: 9.3 mg/dL (ref 8.7–10.3)
Chloride: 101 mmol/L (ref 96–106)
Creatinine, Ser: 1.07 mg/dL — ABNORMAL HIGH (ref 0.57–1.00)
GFR calc Af Amer: 62 mL/min/{1.73_m2} (ref >59)
GFR calc non Af Amer: 53 mL/min/{1.73_m2} — ABNORMAL LOW (ref >59)
Globulin, Total: 3.5 g/dL (ref 1.5–4.5)
Glucose: 84 mg/dL (ref 65–99)
Potassium: 4.3 mmol/L (ref 3.5–5.2)
Sodium: 141 mmol/L (ref 134–144)
Total Protein: 7.8 g/dL (ref 6.0–8.5)

## 2019-09-26 LAB — LIPID PANEL
Chol/HDL Ratio: 2.5 ratio (ref 0.0–4.4)
Cholesterol, Total: 200 mg/dL — ABNORMAL HIGH (ref 100–199)
HDL: 79 mg/dL (ref >39)
LDL Chol Calc (NIH): 105 mg/dL — ABNORMAL HIGH (ref 0–99)
Triglycerides: 92 mg/dL (ref 0–149)
VLDL Cholesterol Cal: 16 mg/dL (ref 5–40)

## 2019-10-15 MED FILL — ALBUTEROL SULFATE HFA 108 (: 108 (90 BAS | 17 days supply | Qty: 9 | Fill #1

## 2019-10-15 MED FILL — metFORMIN HCL 500 MG TABS: 500 | 90 days supply | Qty: 90 | Fill #0

## 2019-10-15 MED FILL — ATORVASTATIN 20 MG TABLET: 20 | 90 days supply | Qty: 90 | Fill #0

## 2019-10-29 MED FILL — MELOXICAM 7.5 MG TABLET: 7.5 | 30 days supply | Qty: 30 | Fill #1

## 2019-10-31 IMAGING — CR DG CHEST 2V
2 series · 2 of 2 positions shown · non-contrast
Comparison: 10/07/2012

CLINICAL DATA: 67-year-old female with a history of preoperative
testing for hip arthroplasty

EXAM:
CHEST - 2 VIEW

[w chest pa]
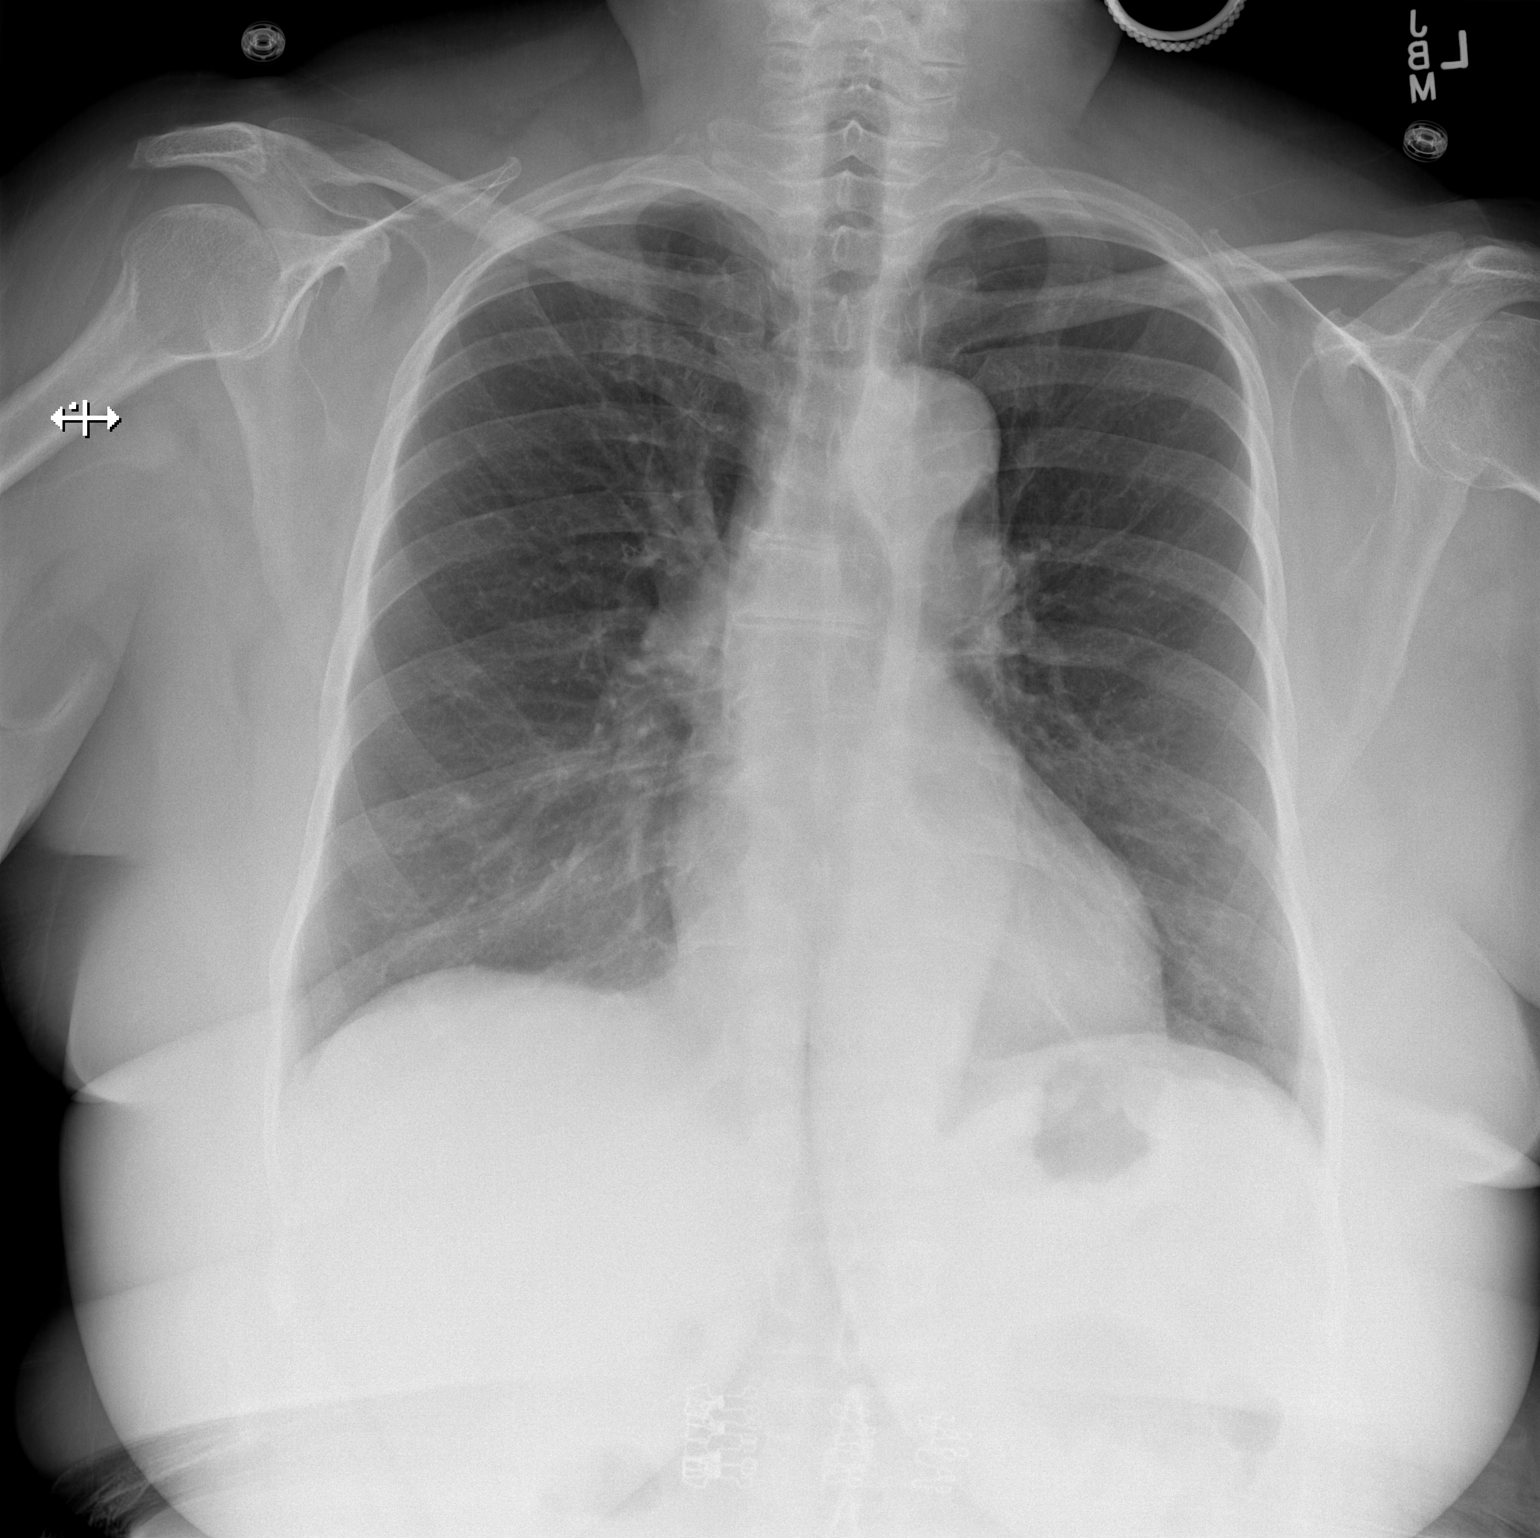

[w chest lat]
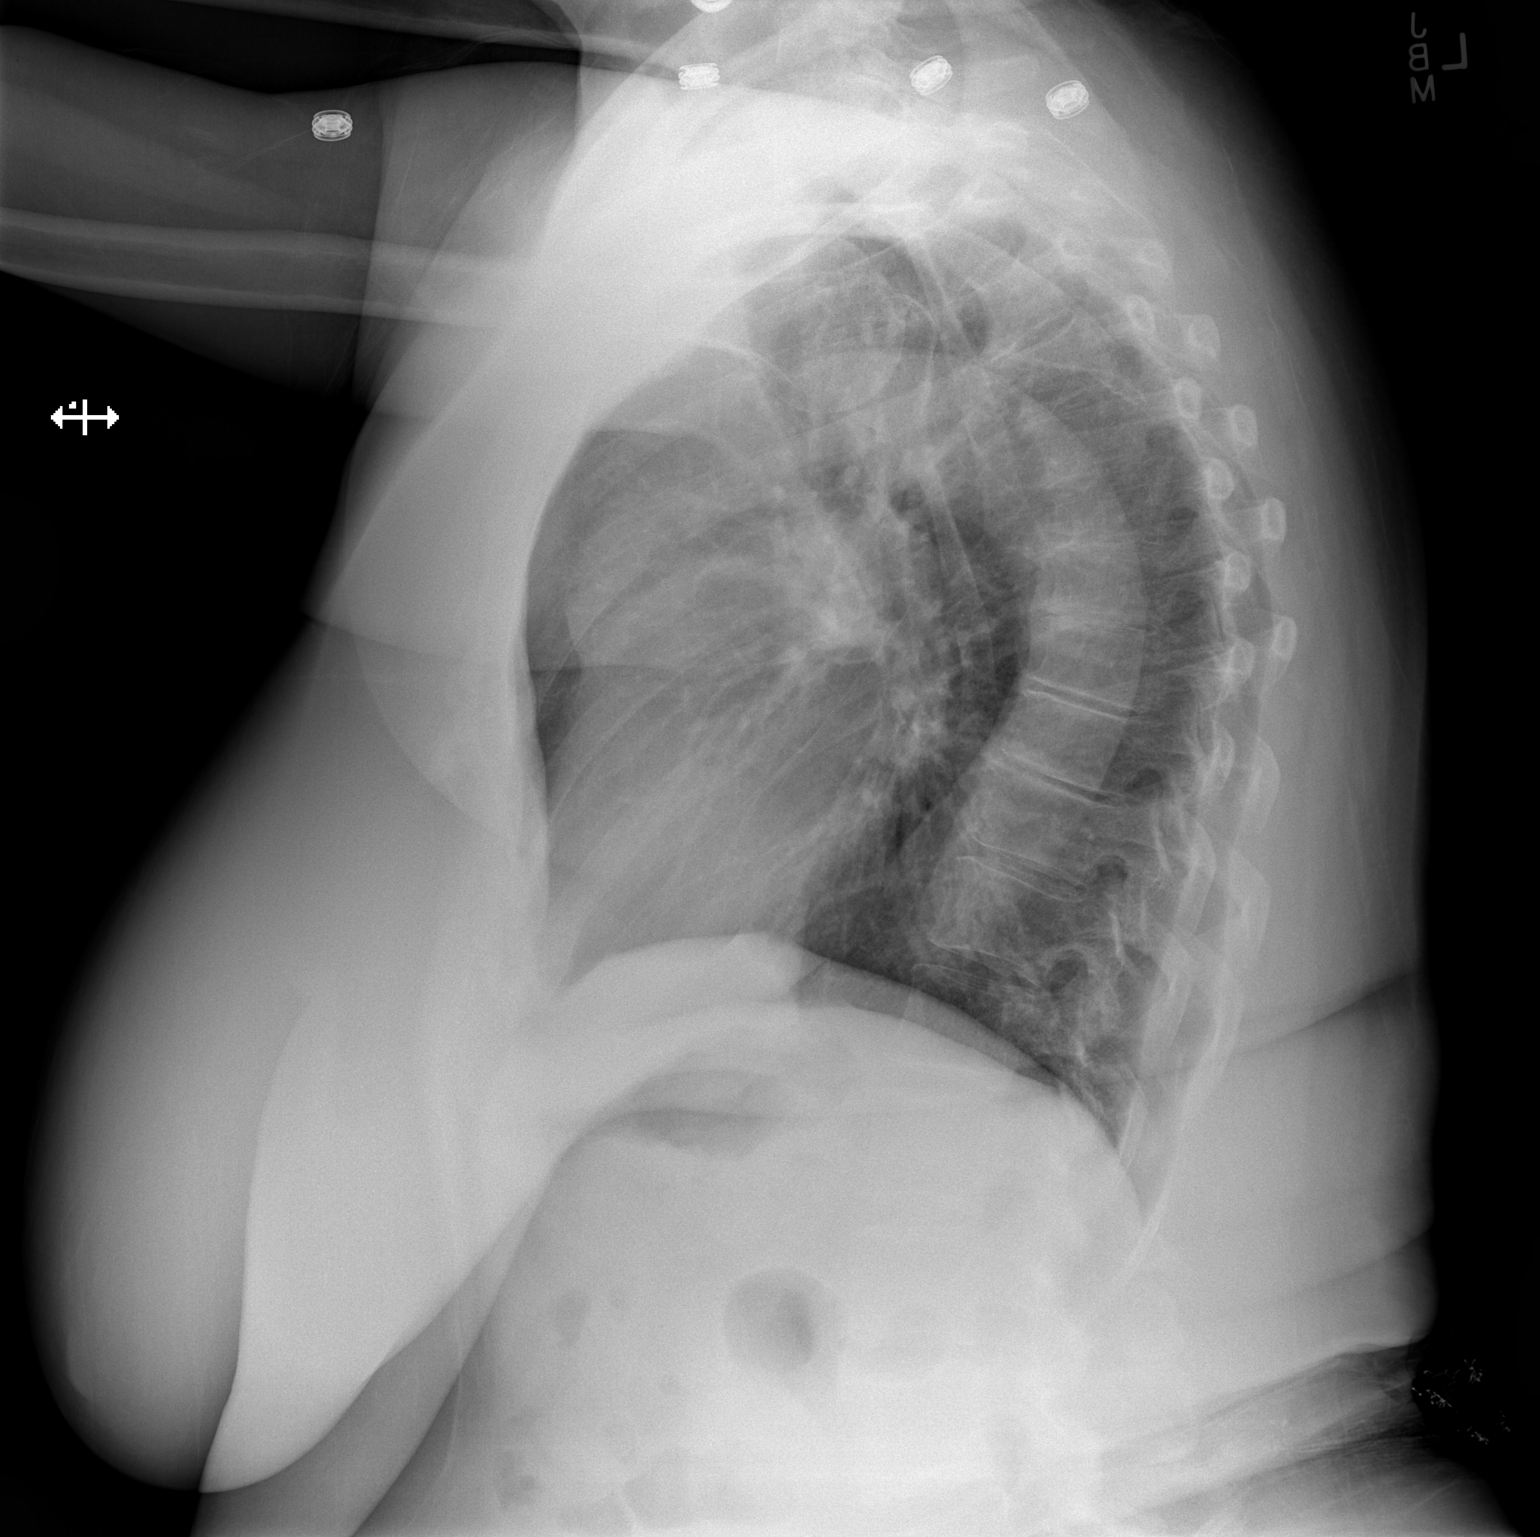

[2 of 2 positions shown; findings below may reference images not displayed]

FINDINGS: The heart size and mediastinal contours are within normal limits.
Both lungs are clear. Mild scoliotic curvature of the thoracolumbar
spine.
IMPRESSION: Negative for acute cardiopulmonary disease.

## 2019-12-04 ENCOUNTER — Other Ambulatory Visit: Payer: Self-pay | Admitting: Family Medicine

## 2019-12-04 DIAGNOSIS — K30 Functional dyspepsia: Secondary | ICD-10-CM

## 2019-12-04 DIAGNOSIS — I10 Essential (primary) hypertension: Secondary | ICD-10-CM

## 2019-12-04 MED FILL — OMEPRAZOLE DR 20 MG CAPSULE: 20 | 90 days supply | Qty: 90 | Fill #0

## 2019-12-04 MED FILL — LOSARTAN POTASSIUM 50 MG TA: 50 | 90 days supply | Qty: 135 | Fill #0

## 2019-12-04 MED FILL — SHINGRIX 50 MCG SUS: 50 | 1 days supply | Qty: 1 | Fill #0

## 2019-12-04 NOTE — Telephone Encounter (Signed)
Requested Prescriptions  Pending Prescriptions Disp Refills  . omeprazole (PRILOSEC) 20 MG capsule [Pharmacy Med Name: OMEPRAZOLE 20 MG CAPSULE DR 20 Capsule] 90 capsule 0    Sig: TAKE 1 CAPSULE BY MOUTH DAILY     Gastroenterology: Proton Pump Inhibitors Passed - 12/04/2019  3:25 PM      Passed - Valid encounter within last 12 months    Recent Outpatient Visits          2 months ago Encounter for Medicare annual wellness exam   Primary Care at [facility], [place] A, MD   3 months ago Need for prophylactic vaccination and inoculation against influenza   Primary Care at [facility], Arlie Solomons, MD   7 months ago Essential hypertension   Primary Care at [facility], Arlie Solomons, MD   9 months ago Essential hypertension   Primary Care at [facility], Arlie Solomons, MD   1 year ago Encounter for Medicare annual wellness exam   Primary Care at [place], [place]      Future Appointments            In 3 months Forrest Moron, MD Primary Care at [place], [facility]

## 2019-12-04 NOTE — Telephone Encounter (Signed)
Labs reviewed by PCP and noted normal- continue prescribed medication. Next appointment 03/2020

## 2020-01-25 ENCOUNTER — Other Ambulatory Visit: Payer: Self-pay | Admitting: Family Medicine

## 2020-01-25 MED FILL — ATORVASTATIN 20 MG TABLET: 20 | 90 days supply | Qty: 90 | Fill #0

## 2020-01-25 MED FILL — ALBUTEROL SULFATE HFA 108 (: 108 (90 BAS | 16 days supply | Qty: 18 | Fill #0

## 2020-01-25 NOTE — Telephone Encounter (Signed)
Requested Prescriptions  Pending Prescriptions Disp Refills  . atorvastatin (LIPITOR) 20 MG tablet [Pharmacy Med Name: ATORVASTATIN 20 MG TABLET 20 Tablet] 90 tablet 0    Sig: TAKE 1 TABLET BY MOUTH ONCE A DAY     Cardiovascular:  Antilipid - Statins Failed - 01/25/2020 12:52 PM      Failed - Total Cholesterol in normal range and within 360 days    Cholesterol, Total  Date Value Ref Range Status  09/25/2019 200 (H) 100 - 199 mg/dL Final         Failed - LDL in normal range and within 360 days    LDL Chol Calc (NIH)  Date Value Ref Range Status  09/25/2019 105 (H) 0 - 99 mg/dL Final         Passed - HDL in normal range and within 360 days    HDL  Date Value Ref Range Status  09/25/2019 79 >39 mg/dL Final         Passed - Triglycerides in normal range and within 360 days    Triglycerides  Date Value Ref Range Status  09/25/2019 92 0 - 149 mg/dL Final         Passed - Patient is not pregnant      Passed - Valid encounter within last 12 months    Recent Outpatient Visits          4 months ago Encounter for [employer] annual wellness exam   Primary Care at [facility], [place] A, MD   5 months ago Need for prophylactic vaccination and inoculation against influenza   Primary Care at [facility], Arlie Solomons, MD   8 months ago Essential hypertension   Primary Care at [facility], Arlie Solomons, MD   10 months ago Essential hypertension   Primary Care at [facility], Arlie Solomons, MD   1 year ago Encounter for Medicare annual wellness exam   Primary Care at [place], MD      Future Appointments            In 2 months Forrest Moron, MD Primary Care at [place], [facility]           . albuterol (VENTOLIN HFA) 108 (90 Base) MCG/ACT inhaler [Pharmacy Med Name: ALBUTEROL SULFATE HFA 108 ( 108 (90 BAS Aerosol] 8.5 g 2    Sig: INHALE 2 PUFFS BY MOUTH INTO THE LUNGS EVERY 4 HOURS AS NEEDED FOR WHEEZING, COUGH, OR SHORTNESS OF BREATH     Pulmonology:  Beta Agonists Failed -  01/25/2020 12:52 PM      Failed - One inhaler should last at least one month. If the patient is requesting refills earlier, contact the patient to check for uncontrolled symptoms.      Passed - Valid encounter within last 12 months    Recent Outpatient Visits          4 months ago Encounter for Medicare annual wellness exam   Primary Care at [facility], [place] A, MD   5 months ago Need for prophylactic vaccination and inoculation against influenza   Primary Care at [facility], Arlie Solomons, MD   8 months ago Essential hypertension   Primary Care at [facility], Arlie Solomons, MD   10 months ago Essential hypertension   Primary Care at [facility], Arlie Solomons, MD   1 year ago Encounter for Medicare annual wellness exam   Primary Care at [place], [place]      Future  Appointments            In 2 months Forrest Moron, MD Primary Care at [place], [facility]

## 2020-01-28 MED FILL — FLUTICASONE PROP 50 MCG SPR: 50 | 30 days supply | Qty: 16 | Fill #0

## 2020-03-04 ENCOUNTER — Other Ambulatory Visit: Payer: Self-pay | Admitting: Family Medicine

## 2020-03-04 DIAGNOSIS — I10 Essential (primary) hypertension: Secondary | ICD-10-CM

## 2020-03-04 DIAGNOSIS — K30 Functional dyspepsia: Secondary | ICD-10-CM

## 2020-03-04 MED FILL — MELOXICAM 7.5 MG TABLET: 7.5 | 30 days supply | Qty: 30 | Fill #2

## 2020-03-04 MED FILL — LOSARTAN POTASSIUM 50 MG TA: 50 | 90 days supply | Qty: 135 | Fill #0

## 2020-03-04 MED FILL — OMEPRAZOLE DR 20 MG CAPSULE: 20 | 90 days supply | Qty: 90 | Fill #0

## 2020-03-04 NOTE — Telephone Encounter (Signed)
Requested Prescriptions  Pending Prescriptions Disp Refills  . omeprazole (PRILOSEC) 20 MG capsule [Pharmacy Med Name: OMEPRAZOLE DR 20 MG CAPSULE 20 Capsule] 90 capsule 0    Sig: TAKE 1 CAPSULE BY MOUTH DAILY     Gastroenterology: Proton Pump Inhibitors Passed - 03/04/2020  8:58 AM      Passed - Valid encounter within last 12 months    Recent Outpatient Visits          5 months ago Encounter for Medicare annual wellness exam   Primary Care at [facility], [place] A, MD   6 months ago Need for prophylactic vaccination and inoculation against influenza   Primary Care at [facility], Arlie Solomons, MD   10 months ago Essential hypertension   Primary Care at [facility], [place], MD   1 year ago Essential hypertension   Primary Care at [facility], [place], MD   1 year ago Encounter for Medicare annual wellness exam   Primary Care at [place], MD      Future Appointments            In 3 weeks Forrest Moron, MD Primary Care at [place], [place]           . losartan (COZAAR) 50 MG tablet [Pharmacy Med Name: LOSARTAN POTASSIUM 50 MG TA 50 Tablet] 135 tablet 0    Sig: TAKE 1 & 1/2 TABLETS BY MOUTH ONCE A DAY     Cardiovascular:  Angiotensin Receptor Blockers Failed - 03/04/2020  8:58 AM      Failed - Cr in normal range and within 180 days    Creatinine, Ser  Date Value Ref Range Status  09/25/2019 1.07 (H) 0.57 - 1.00 mg/dL Final         Passed - K in normal range and within 180 days    Potassium  Date Value Ref Range Status  09/25/2019 4.3 3.5 - 5.2 mmol/L Final         Passed - Patient is not pregnant      Passed - Last BP in normal range    BP Readings from Last 1 Encounters:  09/25/19 135/81         Passed - Valid encounter within last 6 months    Recent Outpatient Visits          5 months ago Encounter for Medicare annual wellness exam   Primary Care at [facility], [place] A, MD   6 months ago Need for prophylactic vaccination and inoculation  against influenza   Primary Care at [facility], Arlie Solomons, MD   10 months ago Essential hypertension   Primary Care at [facility], Arlie Solomons, MD   1 year ago Essential hypertension   Primary Care at [facility], Arlie Solomons, MD   1 year ago Encounter for Medicare annual wellness exam   Primary Care at [place], MD      Future Appointments            In 3 weeks Forrest Moron, MD Primary Care at [place], [facility]

## 2020-03-05 MED FILL — FLUTICASONE PROP 50 MCG SPR: 50 | 30 days supply | Qty: 16 | Fill #1

## 2020-03-25 ENCOUNTER — Telehealth (INDEPENDENT_AMBULATORY_CARE_PROVIDER_SITE_OTHER): Payer: Medicare Other | Admitting: Family Medicine

## 2020-03-25 ENCOUNTER — Other Ambulatory Visit: Payer: Self-pay | Admitting: Family Medicine

## 2020-03-25 ENCOUNTER — Encounter: Payer: Self-pay | Admitting: Family Medicine

## 2020-03-25 ENCOUNTER — Other Ambulatory Visit: Payer: Self-pay

## 2020-03-25 DIAGNOSIS — K30 Functional dyspepsia: Secondary | ICD-10-CM

## 2020-03-25 DIAGNOSIS — I10 Essential (primary) hypertension: Secondary | ICD-10-CM

## 2020-03-25 MED ORDER — OMEPRAZOLE 20 MG PO CPDR: 20.0000 mg | DELAYED_RELEASE_CAPSULE | Freq: Every day | ORAL | 3 refills | Status: DC

## 2020-03-25 NOTE — Patient Instructions (Signed)
° ° ° °  If you have lab work done today you will be contacted with your lab results within the next 2 weeks.  If you have not heard from us then please contact us. The fastest way to get your results is to register for My Chart. ° ° °IF you received an x-ray today, you will receive an invoice from [facility]. Please contact [facility] at [phone] with questions or concerns regarding your invoice.  ° °IF you received labwork today, you will receive an invoice from [facility]. Please contact [facility] at [phone] with questions or concerns regarding your invoice.  ° °Our billing staff will not be able to assist you with questions regarding bills from these companies. ° °You will be contacted with the lab results as soon as they are available. The fastest way to get your results is to activate your My Chart account. Instructions are located on the last page of this paperwork. If you have not heard from us regarding the results in 2 weeks, please contact this office. °  ° ° ° °

## 2020-03-25 NOTE — Progress Notes (Signed)
Telemedicine Encounter- SOAP NOTE Established Patient  This telephone encounter was conducted with the patient's (or proxy's) verbal consent via audio telecommunications: yes/no: Yes Patient was instructed to have this encounter in a suitably private space; and to only have persons present to whom they give permission to participate. In addition, patient identity was confirmed by use of name plus two identifiers (DOB and address).  I discussed the limitations, risks, security and privacy concerns of performing an evaluation and management service by telephone and the availability of in person appointments. I also discussed with the patient that there may be a patient responsible charge related to this service. The patient expressed understanding and agreed to proceed.  I spent a total of TIME; 0 MIN TO 60 MIN: 15 minutes talking with the patient or their proxy.  Chief Complaint  Patient presents with  . Medical Management of Chronic Issues    6 month f/u.  Per pt bp's have been a little elevated due to upcoming gender reveal event for her niece and finding out dr Nolon Rod was leaving and not knowing if she would be able to pick a new doc at [facility] or not.   . Medication Refill    prilosec    Subjective   Barbara Adkins is a 69 y.o. established patient. Telephone visit today for  HPI  Patient reports that she has not had time to sit down and rest.  She states that she has not checked her bp.  She takes her medications and denies any issues with chest pains, shortness of breath or palpitations.  She has a blood pressure monitor at home.    She states that her knees have arthritis and is taking meloxicam in the morning which helps.   She needs a refill of her medication prilosec.  Patient Active Problem List   Diagnosis Date Noted  . Bilateral primary osteoarthritis of knee 07/17/2019  . Unilateral primary osteoarthritis, left knee 02/06/2019  . Status post left hip  replacement 11/28/2018  . Unilateral primary osteoarthritis, left hip 11/21/2018  . Weight gain 04/17/2018  . Dyslipidemia 04/17/2018  . Prediabetes 04/17/2018  . Bronchospasm 04/17/2018  . Essential hypertension 03/20/2018  . Class 1 obesity due to excess calories with serious comorbidity and body mass index (BMI) of 34.0 to 34.9 in adult 03/20/2018  . Acute pain of left knee 03/20/2018    Past Medical History:  Diagnosis Date  . Allergy   . Arthritis   . Deviated septum   . Diabetes mellitus without complication ([place])   . GERD (gastroesophageal reflux disease)   . Hypertension     Current Outpatient Medications  Medication Sig Dispense Refill  . albuterol (VENTOLIN HFA) 108 (90 Base) MCG/ACT inhaler INHALE 2 PUFFS BY MOUTH INTO THE LUNGS EVERY 4 HOURS AS NEEDED FOR WHEEZING, COUGH, OR SHORTNESS OF BREATH 8.5 g 2  . atorvastatin (LIPITOR) 20 MG tablet TAKE 1 TABLET BY MOUTH ONCE A DAY 90 tablet 0  . Blood Pressure Monitoring (BLOOD PRESSURE KIT) DEVI 1 Device by Does not apply route 3 (three) times a week. Use to check blood pressure three times a week. ICD10 I10 1 Device 0  . losartan (COZAAR) 50 MG tablet TAKE 1 & 1/2 TABLETS BY MOUTH ONCE A DAY 135 tablet 0  . meloxicam (MOBIC) 7.5 MG tablet Take 1 tablet (7.5 mg total) by mouth daily. 30 tablet 3  . metFORMIN (GLUCOPHAGE) 500 MG tablet TAKE 1 TABLET (500 MG TOTAL) BY MOUTH  DAILY WITH BREAKFAST. 90 tablet 1  . acetaminophen (TYLENOL) 325 MG tablet Take 2 tablets (650 mg total) by mouth every 6 (six) hours. (Patient not taking: Reported on 03/25/2020)    . cetirizine (ZYRTEC) 10 MG tablet Take 1 tablet (10 mg total) by mouth daily. (Patient not taking: Reported on 03/25/2020) 30 tablet 11  . omeprazole (PRILOSEC) 20 MG capsule Take 1 capsule (20 mg total) by mouth daily. 90 capsule 3   No current facility-administered medications for this visit.    No Known Allergies  Social History   Socioeconomic History  . Marital status:  Divorced    Spouse name: Not on file  . Number of children: Not on file  . Years of education: Not on file  . Highest education level: Not on file  Occupational History  . Not on file  Tobacco Use  . Smoking status: Never Smoker  . Smokeless tobacco: Never Used  Substance and Sexual Activity  . Alcohol use: No  . Drug use: Never  . Sexual activity: Not on file  Other Topics Concern  . Not on file  Social History Narrative  . Not on file   Social Determinants of Health   Financial Resource Strain:   . Difficulty of Paying Living Expenses:   Food Insecurity:   . Worried About Charity fundraiser in the Last Year:   . Arboriculturist in the Last Year:   Transportation Needs:   . Film/video editor (Medical):   Marland Kitchen Lack of Transportation (Non-Medical):   Physical Activity:   . Days of Exercise per Week:   . Minutes of Exercise per Session:   Stress:   . Feeling of Stress :   Social Connections:   . Frequency of Communication with Friends and Family:   . Frequency of Social Gatherings with Friends and Family:   . Attends Religious Services:   . Active Member of Clubs or Organizations:   . Attends Archivist Meetings:   Marland Kitchen Marital Status:   Intimate Partner Violence:   . Fear of Current or Ex-Partner:   . Emotionally Abused:   Marland Kitchen Physically Abused:   . Sexually Abused:     ROS Review of Systems  Constitutional: Negative for activity change, appetite change, chills and fever.  HENT: Negative for congestion, nosebleeds, trouble swallowing and voice change.   Respiratory: Negative for cough, shortness of breath and wheezing.   Gastrointestinal: Negative for diarrhea, nausea and vomiting.  Genitourinary: Negative for difficulty urinating, dysuria, flank pain and hematuria.  Musculoskeletal: Negative for back pain, joint swelling and neck pain.  Neurological: Negative for dizziness, speech difficulty, light-headedness and numbness.  See HPI. All other review of  systems negative.   Objective   Vitals as reported by the patient:  No vitals recorded, pt can't find her bp cuff  There were no vitals filed for this visit.  Barbara Adkins was seen today for medical management of chronic issues and medication refill.  Diagnoses and all orders for this visit:  Essential hypertension  Indigestion -     omeprazole (PRILOSEC) 20 MG capsule; Take 1 capsule (20 mg total) by mouth daily.   Will check blood pressure in person Continue with omeprazole prn Patient is on NSAIDs for joint pains Will continue to monitor renal function  I discussed the assessment and treatment plan with the patient. The patient was provided an opportunity to ask questions and all were answered. The patient agreed with the plan and  demonstrated an understanding of the instructions.   The patient was advised to call back or seek an in-person evaluation if the symptoms worsen or if the condition fails to improve as anticipated.  I provided 15 minutes of non-face-to-face time during this encounter.  Forrest Moron, MD  Primary Care at [facility]

## 2020-04-15 ENCOUNTER — Other Ambulatory Visit (HOSPITAL_COMMUNITY): Payer: Self-pay | Admitting: Family Medicine

## 2020-04-15 DIAGNOSIS — E785 Hyperlipidemia, unspecified: Secondary | ICD-10-CM | POA: Diagnosis not present

## 2020-04-15 DIAGNOSIS — J4521 Mild intermittent asthma with (acute) exacerbation: Secondary | ICD-10-CM | POA: Diagnosis not present

## 2020-04-15 DIAGNOSIS — I10 Essential (primary) hypertension: Secondary | ICD-10-CM | POA: Diagnosis not present

## 2020-04-15 MED FILL — MELOXICAM 7.5 MG TABLET: 7.5 | 30 days supply | Qty: 30 | Fill #3

## 2020-04-15 MED FILL — ATORVASTATIN 20 MG TABLET: 20 | 90 days supply | Qty: 90 | Fill #0

## 2020-04-16 MED FILL — PROAIR HFA 90 MCG INHALER: 108 (90 BAS | 17 days supply | Qty: 9 | Fill #1

## 2020-04-16 MED FILL — MONTELUKAST SOD 10 MG TAB: 10 | 90 days supply | Qty: 90 | Fill #0

## 2020-04-17 MED FILL — FLUTICASONE PROP 50 MCG SPR: 50 | 30 days supply | Qty: 16 | Fill #2

## 2020-04-18 DIAGNOSIS — J4540 Moderate persistent asthma, uncomplicated: Secondary | ICD-10-CM | POA: Diagnosis not present

## 2020-04-18 DIAGNOSIS — I10 Essential (primary) hypertension: Secondary | ICD-10-CM | POA: Diagnosis not present

## 2020-04-18 MED FILL — AMLODIPINE BESYLATE 5 MG TA: 5 | 30 days supply | Qty: 30 | Fill #0

## 2020-04-18 MED FILL — SYMBICORT 160-4.5 MCG INH: 160-4.5 | 30 days supply | Qty: 10 | Fill #0

## 2020-04-18 MED FILL — predniSONE 50 MG TABS: 50 | 5 days supply | Qty: 5 | Fill #0

## 2020-05-12 MED FILL — SYMBICORT 160-4.5 MCG INH: 160-4.5 | 30 days supply | Qty: 10 | Fill #1

## 2020-05-12 MED FILL — AMLODIPINE BESYLATE 5 MG TA: 5 | 30 days supply | Qty: 30 | Fill #1

## 2020-05-12 MED FILL — FLUTICASONE PROP 50 MCG SPR: 50 | 30 days supply | Qty: 16 | Fill #3

## 2020-05-13 MED FILL — MELOXICAM 7.5 MG TABLET: 7.5 | 90 days supply | Qty: 90 | Fill #0

## 2020-06-03 MED FILL — METFORMIN HCL 500 MG TABS: 500 | 90 days supply | Qty: 90 | Fill #1

## 2020-06-04 ENCOUNTER — Other Ambulatory Visit: Payer: Self-pay | Admitting: Family Medicine

## 2020-06-04 DIAGNOSIS — Z1231 Encounter for screening mammogram for malignant neoplasm of breast: Secondary | ICD-10-CM

## 2020-06-09 MED FILL — AMLODIPINE BESYLATE 5 MG TA: 5 | 30 days supply | Qty: 30 | Fill #2

## 2020-06-09 MED FILL — OMEPRAZOLE DR 20 MG CAPSULE: 20 | 90 days supply | Qty: 90 | Fill #0

## 2020-06-10 ENCOUNTER — Other Ambulatory Visit: Payer: Self-pay

## 2020-06-10 DIAGNOSIS — I10 Essential (primary) hypertension: Secondary | ICD-10-CM

## 2020-06-10 MED ORDER — LOSARTAN POTASSIUM 50 MG PO TABS: ORAL_TABLET | ORAL | 0 refills | Status: DC

## 2020-06-10 MED FILL — LOSARTAN POTASSIUM 50 MG TA: 50 | 45 days supply | Qty: 60 | Fill #0

## 2020-06-13 ENCOUNTER — Emergency Department (HOSPITAL_COMMUNITY)
Admission: EM | Admit: 2020-06-13 | Discharge: 2020-06-14 | Disposition: A | Discharge status: Home / Self Care | Payer: Medicare Other | Attending: Emergency Medicine | Admitting: Emergency Medicine

## 2020-06-13 ENCOUNTER — Other Ambulatory Visit: Payer: Self-pay

## 2020-06-13 ENCOUNTER — Encounter (HOSPITAL_COMMUNITY): Payer: Self-pay | Admitting: Emergency Medicine

## 2020-06-13 DIAGNOSIS — H5789 Other specified disorders of eye and adnexa: Secondary | ICD-10-CM | POA: Diagnosis not present

## 2020-06-13 DIAGNOSIS — W2210XA Striking against or struck by unspecified automobile airbag, initial encounter: Secondary | ICD-10-CM | POA: Insufficient documentation

## 2020-06-13 DIAGNOSIS — Y999 Unspecified external cause status: Secondary | ICD-10-CM | POA: Insufficient documentation

## 2020-06-13 DIAGNOSIS — Z5321 Procedure and treatment not carried out due to patient leaving prior to being seen by health care provider: Secondary | ICD-10-CM | POA: Insufficient documentation

## 2020-06-13 DIAGNOSIS — Y92410 Unspecified street and highway as the place of occurrence of the external cause: Secondary | ICD-10-CM | POA: Insufficient documentation

## 2020-06-13 DIAGNOSIS — Y939 Activity, unspecified: Secondary | ICD-10-CM | POA: Diagnosis not present
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
## 2020-06-14 NOTE — ED Notes (Signed)
Pt called x 3  No answer. 

## 2020-06-18 MED FILL — FLUTICASONE PROP 50 MCG SPR: 50 | 30 days supply | Qty: 16 | Fill #4

## 2020-06-18 MED FILL — LOSARTAN POTASSIUM 100 MG T: 100 | 90 days supply | Qty: 90 | Fill #0

## 2020-06-23 MED FILL — SYMBICORT 160-4.5 MCG INH: 160-4.5 | 30 days supply | Qty: 10 | Fill #0

## 2020-07-04 ENCOUNTER — Other Ambulatory Visit: Payer: Self-pay

## 2020-07-04 ENCOUNTER — Ambulatory Visit
Admission: RE | Admit: 2020-07-04 | Discharge: 2020-07-04 | Disposition: A | Discharge status: Home / Self Care | Payer: Medicare Other | Source: Ambulatory Visit | Attending: Family Medicine | Admitting: Family Medicine

## 2020-07-04 DIAGNOSIS — Z1231 Encounter for screening mammogram for malignant neoplasm of breast: Secondary | ICD-10-CM

## 2020-07-08 ENCOUNTER — Other Ambulatory Visit: Payer: Self-pay | Admitting: Family Medicine

## 2020-07-08 DIAGNOSIS — R928 Other abnormal and inconclusive findings on diagnostic imaging of breast: Secondary | ICD-10-CM

## 2020-07-10 ENCOUNTER — Ambulatory Visit (HOSPITAL_COMMUNITY)
Admission: EM | Admit: 2020-07-10 | Discharge: 2020-07-10 | Disposition: A | Discharge status: Home / Self Care | Payer: Medicare Other | Attending: Family Medicine | Admitting: Family Medicine

## 2020-07-10 ENCOUNTER — Other Ambulatory Visit: Payer: Self-pay

## 2020-07-10 ENCOUNTER — Other Ambulatory Visit: Payer: Self-pay | Admitting: Internal Medicine

## 2020-07-10 DIAGNOSIS — Z20822 Contact with and (suspected) exposure to covid-19: Secondary | ICD-10-CM | POA: Diagnosis not present

## 2020-07-10 DIAGNOSIS — Z0189 Encounter for other specified special examinations: Secondary | ICD-10-CM | POA: Diagnosis not present

## 2020-07-10 LAB — SARS CORONAVIRUS 2 (TAT 6-24 HRS): SARS Coronavirus 2: NEGATIVE

## 2020-07-10 NOTE — ED Triage Notes (Signed)
Pt requests COVID testing. Denies exposure or any URI symptoms, fever, chills, n/v/d.

## 2020-07-14 ENCOUNTER — Other Ambulatory Visit: Payer: Self-pay

## 2020-07-14 ENCOUNTER — Other Ambulatory Visit: Payer: Self-pay | Admitting: Family Medicine

## 2020-07-14 ENCOUNTER — Ambulatory Visit
Admission: RE | Admit: 2020-07-14 | Discharge: 2020-07-14 | Disposition: A | Discharge status: Home / Self Care | Payer: Medicare Other | Source: Ambulatory Visit | Attending: Family Medicine | Admitting: Family Medicine

## 2020-07-14 DIAGNOSIS — R928 Other abnormal and inconclusive findings on diagnostic imaging of breast: Secondary | ICD-10-CM

## 2020-07-14 MED FILL — AMLODIPINE BESYLATE 5 MG TA: 5 | 30 days supply | Qty: 30 | Fill #3

## 2020-07-14 MED FILL — MONTELUKAST SOD 10 MG TAB: 10 | 90 days supply | Qty: 90 | Fill #1

## 2020-07-22 MED FILL — SYMBICORT 160-4.5 MCG INH: 160-4.5 | 30 days supply | Qty: 10 | Fill #0

## 2020-07-24 MED FILL — FLUAD QUADRIVALENT 0.5 ML P: 0.5 | 1 days supply | Qty: 1 | Fill #0

## 2020-08-08 MED FILL — MELOXICAM 7.5 MG TABLET: 7.5 | 90 days supply | Qty: 90 | Fill #1

## 2020-08-12 MED FILL — ATORVASTATIN 20 MG TABLET: 20 | 90 days supply | Qty: 90 | Fill #1

## 2020-08-12 MED FILL — FLUTICASONE PROP 50 MCG SPR: 50 | 30 days supply | Qty: 16 | Fill #5

## 2020-08-19 ENCOUNTER — Other Ambulatory Visit (HOSPITAL_COMMUNITY): Payer: Self-pay | Admitting: Family Medicine

## 2020-08-19 MED FILL — METFORMIN HCL 500 MG TABS: 500 | 90 days supply | Qty: 90 | Fill #0

## 2020-08-19 MED FILL — AMLODIPINE BESYLATE 5 MG TA: 5 | 90 days supply | Qty: 90 | Fill #0

## 2020-08-19 MED FILL — SYMBICORT 160-4.5 MCG INH: 160-4.5 | 30 days supply | Qty: 10 | Fill #0

## 2020-09-08 MED FILL — OMEPRAZOLE DR 20 MG CAPSULE: 20 | 90 days supply | Qty: 90 | Fill #1

## 2020-09-08 MED FILL — LOSARTAN POTASSIUM 100 MG T: 100 | 90 days supply | Qty: 90 | Fill #0

## 2020-10-10 MED FILL — FLUTICASONE PROP 50 MCG SPR: 50 | 30 days supply | Qty: 16 | Fill #6

## 2020-10-10 MED FILL — SYMBICORT 160-4.5 MCG INH: 160-4.5 | 30 days supply | Qty: 10 | Fill #1

## 2020-10-28 MED FILL — MONTELUKAST SOD 10 MG TAB: 10 | 90 days supply | Qty: 90 | Fill #2

## 2020-11-10 ENCOUNTER — Other Ambulatory Visit (HOSPITAL_COMMUNITY): Payer: Self-pay | Admitting: Family Medicine

## 2020-11-10 MED FILL — SYMBICORT 160-4.5 MCG INH: 160-4.5 | 30 days supply | Qty: 10 | Fill #2

## 2020-11-10 MED FILL — MELOXICAM 7.5 MG TABLET: 7.5 | 30 days supply | Qty: 30 | Fill #0

## 2020-11-10 MED FILL — ATORVASTATIN CALCIUM 20 MG: 20 | 90 days supply | Qty: 90 | Fill #2

## 2020-11-10 MED FILL — AMLODIPINE BESYLATE 5 MG TA: 5 | 90 days supply | Qty: 90 | Fill #1

## 2020-11-24 MED FILL — FLUTICASONE PROP 50 MCG SPR: 50 | 30 days supply | Qty: 16 | Fill #7

## 2020-11-24 MED FILL — METFORMIN HCL 500 MG TABS: 500 | 90 days supply | Qty: 90 | Fill #1

## 2020-12-05 MED FILL — OMEPRAZOLE DR 20 MG CAPSULE: 20 | 90 days supply | Qty: 90 | Fill #2

## 2020-12-05 MED FILL — LOSARTAN POTASSIUM 100 MG T: 100 | 30 days supply | Qty: 30 | Fill #1

## 2020-12-05 MED FILL — MELOXICAM 7.5 MG TABLET: 7.5 | 30 days supply | Qty: 30 | Fill #1

## 2021-01-08 MED FILL — SYMBICORT 160-4.5 MCG INH: 160-4.5 | 30 days supply | Qty: 10 | Fill #3

## 2021-01-08 MED FILL — MELOXICAM 7.5 MG TABLET: 7.5 | 30 days supply | Qty: 30 | Fill #2

## 2021-01-08 MED FILL — LOSARTAN POTASSIUM 100 MG T: 100 | 30 days supply | Qty: 30 | Fill #2

## 2021-01-12 ENCOUNTER — Other Ambulatory Visit (HOSPITAL_COMMUNITY): Payer: Self-pay | Admitting: Otolaryngology

## 2021-01-12 MED FILL — FLUTICASONE PROP 50 MCG SPR: 50 | 30 days supply | Qty: 16 | Fill #0

## 2021-01-13 ENCOUNTER — Other Ambulatory Visit: Payer: Self-pay

## 2021-01-13 ENCOUNTER — Ambulatory Visit: Payer: Medicare Other

## 2021-01-13 ENCOUNTER — Ambulatory Visit
Admission: RE | Admit: 2021-01-13 | Discharge: 2021-01-13 | Disposition: A | Discharge status: Home / Self Care | Payer: Medicare Other | Source: Ambulatory Visit | Attending: Family Medicine | Admitting: Family Medicine

## 2021-01-13 DIAGNOSIS — R928 Other abnormal and inconclusive findings on diagnostic imaging of breast: Secondary | ICD-10-CM

## 2021-02-10 MED FILL — MELOXICAM 7.5 MG TABLET: 7.5 | 30 days supply | Qty: 30 | Fill #3

## 2021-02-10 MED FILL — MONTELUKAST SOD 10 MG TAB: 10 | 90 days supply | Qty: 90 | Fill #3

## 2021-02-10 MED FILL — LOSARTAN POTASSIUM 100 MG T: 100 | 30 days supply | Qty: 30 | Fill #3

## 2021-02-10 MED FILL — AMLODIPINE BESYLATE 5 MG TA: 5 | 90 days supply | Qty: 90 | Fill #2

## 2021-02-19 ENCOUNTER — Other Ambulatory Visit (HOSPITAL_COMMUNITY): Payer: Self-pay

## 2021-02-19 MED FILL — Budesonide-Formoterol Fumarate Dihyd Aerosol 160-4.5 MCG/ACT: RESPIRATORY_TRACT | 30 days supply | Qty: 10.2 | Fill #0 | Status: AC

## 2021-02-19 MED FILL — Fluticasone Propionate Nasal Susp 50 MCG/ACT: NASAL | 30 days supply | Qty: 16 | Fill #0 | Status: AC

## 2021-03-06 ENCOUNTER — Other Ambulatory Visit: Payer: Self-pay | Admitting: Family Medicine

## 2021-03-06 ENCOUNTER — Other Ambulatory Visit: Payer: Self-pay | Admitting: Otolaryngology

## 2021-03-06 ENCOUNTER — Other Ambulatory Visit (HOSPITAL_COMMUNITY): Payer: Self-pay

## 2021-03-06 MED FILL — Metformin HCl Tab 500 MG: ORAL | 90 days supply | Qty: 90 | Fill #0 | Status: AC

## 2021-03-06 MED FILL — Omeprazole Cap Delayed Release 20 MG: ORAL | 90 days supply | Qty: 90 | Fill #0 | Status: AC

## 2021-03-09 ENCOUNTER — Other Ambulatory Visit (HOSPITAL_COMMUNITY): Payer: Self-pay

## 2021-03-10 ENCOUNTER — Other Ambulatory Visit (HOSPITAL_COMMUNITY): Payer: Self-pay

## 2021-03-13 ENCOUNTER — Other Ambulatory Visit: Payer: Self-pay | Admitting: Otolaryngology

## 2021-03-13 ENCOUNTER — Other Ambulatory Visit (HOSPITAL_COMMUNITY): Payer: Self-pay

## 2021-03-13 MED ORDER — MELOXICAM 7.5 MG PO TABS
7.5000 mg | ORAL_TABLET | Freq: Every day | ORAL | 1 refills | Status: DC
  Filled 2021-03-13: qty 30, 30d supply, fill #0

## 2021-03-13 MED ORDER — LOSARTAN POTASSIUM 100 MG PO TABS
1.0000 | ORAL_TABLET | Freq: Every day | ORAL | 1 refills | Status: DC
  Filled 2021-03-13: qty 90, 90d supply, fill #0
  Filled 2021-06-12: qty 90, 90d supply, fill #1

## 2021-03-17 ENCOUNTER — Other Ambulatory Visit (HOSPITAL_COMMUNITY): Payer: Self-pay

## 2021-03-18 ENCOUNTER — Other Ambulatory Visit: Payer: Self-pay | Admitting: Otolaryngology

## 2021-03-18 ENCOUNTER — Other Ambulatory Visit (HOSPITAL_COMMUNITY): Payer: Self-pay

## 2021-04-03 ENCOUNTER — Other Ambulatory Visit (HOSPITAL_COMMUNITY): Payer: Self-pay

## 2021-04-03 MED FILL — Fluticasone Propionate Nasal Susp 50 MCG/ACT: NASAL | 30 days supply | Qty: 16 | Fill #1 | Status: AC

## 2021-04-03 MED FILL — Budesonide-Formoterol Fumarate Dihyd Aerosol 160-4.5 MCG/ACT: RESPIRATORY_TRACT | 30 days supply | Qty: 10.2 | Fill #1 | Status: AC

## 2021-04-20 ENCOUNTER — Other Ambulatory Visit (HOSPITAL_COMMUNITY): Payer: Self-pay

## 2021-04-20 MED ORDER — BUDESONIDE-FORMOTEROL FUMARATE 160-4.5 MCG/ACT IN AERO
2.0000 | INHALATION_SPRAY | Freq: Two times a day (BID) | RESPIRATORY_TRACT | 3 refills | Status: DC
  Filled 2021-04-20 – 2021-05-20 (×2): qty 10.2, 30d supply, fill #0
  Filled 2021-06-12: qty 10.2, 30d supply, fill #1
  Filled 2021-09-21: qty 10.2, 30d supply, fill #2
  Filled 2021-12-29: qty 10.2, 30d supply, fill #3

## 2021-04-20 MED ORDER — OMEPRAZOLE 20 MG PO CPDR
1.0000 | DELAYED_RELEASE_CAPSULE | Freq: Every day | ORAL | 1 refills | Status: DC
  Filled 2021-04-20 – 2021-06-12 (×2): qty 90, 90d supply, fill #0
  Filled 2021-09-21: qty 90, 90d supply, fill #1

## 2021-04-20 MED ORDER — MONTELUKAST SODIUM 10 MG PO TABS
1.0000 | ORAL_TABLET | Freq: Every day | ORAL | 1 refills | Status: DC
  Filled 2021-04-20 – 2021-06-25 (×2): qty 90, 90d supply, fill #0

## 2021-04-20 MED ORDER — MELOXICAM 7.5 MG PO TABS
7.5000 mg | ORAL_TABLET | Freq: Every day | ORAL | 1 refills | Status: DC
  Filled 2021-04-20: qty 30, 30d supply, fill #0
  Filled 2021-08-05: qty 30, 30d supply, fill #1

## 2021-04-20 MED ORDER — AMLODIPINE BESYLATE 5 MG PO TABS
1.0000 | ORAL_TABLET | Freq: Every day | ORAL | 1 refills | Status: DC
  Filled 2021-04-20 – 2021-05-20 (×2): qty 90, 90d supply, fill #0
  Filled 2021-08-26: qty 90, 90d supply, fill #1

## 2021-04-20 MED ORDER — ATORVASTATIN CALCIUM 20 MG PO TABS
1.0000 | ORAL_TABLET | Freq: Every day | ORAL | 1 refills | Status: DC
  Filled 2021-04-20: qty 90, 90d supply, fill #0
  Filled 2021-08-03: qty 90, 90d supply, fill #1

## 2021-04-20 MED ORDER — ALBUTEROL SULFATE HFA 108 (90 BASE) MCG/ACT IN AERS
2.0000 | INHALATION_SPRAY | RESPIRATORY_TRACT | 3 refills | Status: DC
  Filled 2021-04-20: qty 8.5, 17d supply, fill #0

## 2021-04-20 MED ORDER — FLUTICASONE PROPIONATE 50 MCG/ACT NA SUSP
2.0000 | Freq: Every day | NASAL | 3 refills | Status: DC
  Filled 2021-04-20 – 2021-08-05 (×2): qty 16, 30d supply, fill #0
  Filled 2021-09-21: qty 16, 30d supply, fill #1
  Filled 2021-11-24: qty 16, 30d supply, fill #2
  Filled 2022-02-01: qty 16, 30d supply, fill #3

## 2021-04-20 MED ORDER — LOSARTAN POTASSIUM 100 MG PO TABS
1.0000 | ORAL_TABLET | Freq: Every day | ORAL | 1 refills | Status: DC
  Filled 2021-04-20 – 2021-09-21 (×2): qty 90, 90d supply, fill #0
  Filled 2021-11-24 – 2021-12-11 (×2): qty 90, 90d supply, fill #1

## 2021-04-20 MED ORDER — METFORMIN HCL 500 MG PO TABS
500.0000 mg | ORAL_TABLET | Freq: Every day | ORAL | 1 refills | Status: DC
  Filled 2021-04-20 – 2021-11-26 (×2): qty 90, 90d supply, fill #0

## 2021-05-20 ENCOUNTER — Other Ambulatory Visit (HOSPITAL_COMMUNITY): Payer: Self-pay

## 2021-05-20 MED FILL — Fluticasone Propionate Nasal Susp 50 MCG/ACT: NASAL | 30 days supply | Qty: 16 | Fill #2 | Status: AC

## 2021-05-21 ENCOUNTER — Other Ambulatory Visit (HOSPITAL_COMMUNITY): Payer: Self-pay

## 2021-05-21 DIAGNOSIS — H401111 Primary open-angle glaucoma, right eye, mild stage: Secondary | ICD-10-CM | POA: Insufficient documentation

## 2021-05-21 DIAGNOSIS — H40022 Open angle with borderline findings, high risk, left eye: Secondary | ICD-10-CM | POA: Insufficient documentation

## 2021-05-21 DIAGNOSIS — H2512 Age-related nuclear cataract, left eye: Secondary | ICD-10-CM | POA: Insufficient documentation

## 2021-05-21 MED ORDER — LATANOPROST 0.005 % OP SOLN
1.0000 [drp] | Freq: Every day | OPHTHALMIC | 3 refills | Status: AC
  Filled 2021-05-21: qty 7.5, 75d supply, fill #0
  Filled 2021-08-26: qty 7.5, 75d supply, fill #1
  Filled 2021-11-24: qty 7.5, 75d supply, fill #2

## 2021-05-24 DIAGNOSIS — Z83511 Family history of glaucoma: Secondary | ICD-10-CM | POA: Insufficient documentation

## 2021-05-24 DIAGNOSIS — H18513 Endothelial corneal dystrophy, bilateral: Secondary | ICD-10-CM | POA: Insufficient documentation

## 2021-05-28 ENCOUNTER — Other Ambulatory Visit: Payer: Self-pay | Admitting: Family Medicine

## 2021-05-28 DIAGNOSIS — Z1231 Encounter for screening mammogram for malignant neoplasm of breast: Secondary | ICD-10-CM

## 2021-06-12 ENCOUNTER — Other Ambulatory Visit (HOSPITAL_COMMUNITY): Payer: Self-pay

## 2021-06-24 ENCOUNTER — Other Ambulatory Visit (HOSPITAL_COMMUNITY): Payer: Self-pay

## 2021-06-25 ENCOUNTER — Other Ambulatory Visit (HOSPITAL_COMMUNITY): Payer: Self-pay

## 2021-06-25 MED FILL — Metformin HCl Tab 500 MG: ORAL | 90 days supply | Qty: 90 | Fill #1 | Status: AC

## 2021-06-25 MED FILL — Fluticasone Propionate Nasal Susp 50 MCG/ACT: NASAL | 30 days supply | Qty: 16 | Fill #3 | Status: AC

## 2021-06-29 ENCOUNTER — Other Ambulatory Visit (HOSPITAL_COMMUNITY): Payer: Self-pay

## 2021-07-23 ENCOUNTER — Ambulatory Visit
Admission: RE | Admit: 2021-07-23 | Discharge: 2021-07-23 | Disposition: A | Discharge status: Home / Self Care | Payer: Medicare Other | Source: Ambulatory Visit | Attending: Family Medicine | Admitting: Family Medicine

## 2021-07-23 ENCOUNTER — Other Ambulatory Visit: Payer: Self-pay

## 2021-07-23 DIAGNOSIS — Z1231 Encounter for screening mammogram for malignant neoplasm of breast: Secondary | ICD-10-CM

## 2021-07-30 ENCOUNTER — Other Ambulatory Visit (HOSPITAL_COMMUNITY): Payer: Self-pay

## 2021-08-03 ENCOUNTER — Other Ambulatory Visit (HOSPITAL_COMMUNITY): Payer: Self-pay

## 2021-08-04 ENCOUNTER — Other Ambulatory Visit (HOSPITAL_COMMUNITY): Payer: Self-pay

## 2021-08-04 MED ORDER — PREDNISOLONE SODIUM PHOSPHATE 1 % OP SOLN
1.0000 [drp] | Freq: Four times a day (QID) | OPHTHALMIC | 0 refills | Status: DC
  Filled 2021-08-04: qty 10, 50d supply, fill #0

## 2021-08-05 ENCOUNTER — Other Ambulatory Visit (HOSPITAL_COMMUNITY): Payer: Self-pay

## 2021-08-05 MED ORDER — INFLUENZA VAC A&B SA ADJ QUAD 0.5 ML IM PRSY
0.5000 mL | PREFILLED_SYRINGE | INTRAMUSCULAR | 0 refills | Status: DC
  Filled 2021-08-05: qty 0.5, 1d supply, fill #0

## 2021-08-06 ENCOUNTER — Other Ambulatory Visit (HOSPITAL_COMMUNITY): Payer: Self-pay

## 2021-08-26 ENCOUNTER — Other Ambulatory Visit (HOSPITAL_COMMUNITY): Payer: Self-pay

## 2021-09-21 ENCOUNTER — Other Ambulatory Visit (HOSPITAL_COMMUNITY): Payer: Self-pay

## 2021-09-22 ENCOUNTER — Other Ambulatory Visit (HOSPITAL_COMMUNITY): Payer: Self-pay

## 2021-09-22 MED ORDER — PREDNISOLONE SODIUM PHOSPHATE 1 % OP SOLN
1.0000 [drp] | Freq: Four times a day (QID) | OPHTHALMIC | 0 refills | Status: DC
  Filled 2021-09-22: qty 10, 50d supply, fill #0

## 2021-10-05 ENCOUNTER — Other Ambulatory Visit (HOSPITAL_COMMUNITY): Payer: Self-pay

## 2021-10-15 ENCOUNTER — Other Ambulatory Visit (HOSPITAL_COMMUNITY): Payer: Self-pay

## 2021-10-15 MED ORDER — PREDNISOLONE ACETATE 1 % OP SUSP
1.0000 [drp] | Freq: Four times a day (QID) | OPHTHALMIC | 1 refills | Status: DC
  Filled 2021-10-15: qty 15, 75d supply, fill #0
  Filled 2021-10-23: qty 15, 30d supply, fill #0

## 2021-10-23 ENCOUNTER — Other Ambulatory Visit (HOSPITAL_COMMUNITY): Payer: Self-pay

## 2021-11-05 ENCOUNTER — Other Ambulatory Visit (HOSPITAL_COMMUNITY): Payer: Self-pay

## 2021-11-24 ENCOUNTER — Other Ambulatory Visit: Payer: Self-pay | Admitting: Otolaryngology

## 2021-11-24 ENCOUNTER — Other Ambulatory Visit (HOSPITAL_COMMUNITY): Payer: Self-pay

## 2021-11-25 ENCOUNTER — Other Ambulatory Visit (HOSPITAL_COMMUNITY): Payer: Self-pay

## 2021-11-26 ENCOUNTER — Other Ambulatory Visit (HOSPITAL_COMMUNITY): Payer: Self-pay

## 2021-11-27 ENCOUNTER — Other Ambulatory Visit (HOSPITAL_COMMUNITY): Payer: Self-pay

## 2021-11-27 ENCOUNTER — Other Ambulatory Visit: Payer: Self-pay | Admitting: Family Medicine

## 2021-11-27 NOTE — Telephone Encounter (Signed)
Call to patient-scheduled NP appointment. Advised UC for medication RF bridge. Requested Prescriptions  Pending Prescriptions Disp Refills   amLODipine (NORVASC) 5 MG tablet 90 tablet 1    Sig: TAKE 1 TABLET BY MOUTH ONCE DAILY.     Cardiovascular:  Calcium Channel Blockers Failed - 11/27/2021  1:24 PM      Failed - Last BP in normal range    BP Readings from Last 1 Encounters:  07/10/20 (!) 160/102         Failed - Valid encounter within last 6 months    Recent Outpatient Visits          1 year ago Essential hypertension   Primary Care at [place], [place], MD   2 years ago Encounter for Medicare annual wellness exam   Primary Care at [facility], [place] A, MD   2 years ago Need for prophylactic vaccination and inoculation against influenza   Primary Care at [facility], Arlie Solomons, MD   2 years ago Essential hypertension   Primary Care at [facility], [place], MD   2 years ago Essential hypertension   Primary Care at [place], MD      Future Appointments            In 2 months Charlott Rakes, MD [place]            atorvastatin (LIPITOR) 20 MG tablet 90 tablet 1    Sig: TAKE 1 TABLET BY MOUTH DAILY     Cardiovascular:  Antilipid - Statins Failed - 11/27/2021  1:24 PM      Failed - Total Cholesterol in normal range and within 360 days    Cholesterol, Total  Date Value Ref Range Status  09/25/2019 200 (H) 100 - 199 mg/dL Final         Failed - LDL in normal range and within 360 days    LDL Chol Calc (NIH)  Date Value Ref Range Status  09/25/2019 105 (H) 0 - 99 mg/dL Final         Failed - HDL in normal range and within 360 days    HDL  Date Value Ref Range Status  09/25/2019 79 >39 mg/dL Final         Failed - Triglycerides in normal range and within 360 days    Triglycerides  Date Value Ref Range Status  09/25/2019 92 0 - 149 mg/dL Final         Failed - Valid encounter within last 12 months     Recent Outpatient Visits          1 year ago Essential hypertension   Primary Care at [facility], Arlie Solomons, MD   2 years ago Encounter for Medicare annual wellness exam   Primary Care at [facility], [place] A, MD   2 years ago Need for prophylactic vaccination and inoculation against influenza   Primary Care at [facility], Arlie Solomons, MD   2 years ago Essential hypertension   Primary Care at [facility], Arlie Solomons, MD   2 years ago Essential hypertension   Primary Care at [facility], Arlie Solomons, MD      Future Appointments            In 2 months Charlott Rakes, MD [place] - Patient is not pregnant

## 2021-12-11 ENCOUNTER — Other Ambulatory Visit (HOSPITAL_COMMUNITY): Payer: Self-pay

## 2021-12-17 ENCOUNTER — Ambulatory Visit: Payer: Medicare Other | Admitting: Physician Assistant

## 2021-12-25 ENCOUNTER — Other Ambulatory Visit (HOSPITAL_COMMUNITY): Payer: Self-pay

## 2021-12-29 ENCOUNTER — Other Ambulatory Visit (HOSPITAL_COMMUNITY): Payer: Self-pay

## 2022-01-14 ENCOUNTER — Ambulatory Visit
Admission: EM | Admit: 2022-01-14 | Discharge: 2022-01-14 | Disposition: A | Discharge status: Home / Self Care | Payer: Medicare Other | Attending: Internal Medicine | Admitting: Internal Medicine

## 2022-01-14 ENCOUNTER — Other Ambulatory Visit: Payer: Self-pay

## 2022-01-14 ENCOUNTER — Encounter: Payer: Self-pay | Admitting: Emergency Medicine

## 2022-01-14 ENCOUNTER — Other Ambulatory Visit (HOSPITAL_COMMUNITY): Payer: Self-pay

## 2022-01-14 DIAGNOSIS — J4520 Mild intermittent asthma, uncomplicated: Secondary | ICD-10-CM | POA: Diagnosis not present

## 2022-01-14 DIAGNOSIS — I10 Essential (primary) hypertension: Secondary | ICD-10-CM

## 2022-01-14 DIAGNOSIS — Z760 Encounter for issue of repeat prescription: Secondary | ICD-10-CM

## 2022-01-14 MED ORDER — BUDESONIDE-FORMOTEROL FUMARATE 160-4.5 MCG/ACT IN AERO
2.0000 | INHALATION_SPRAY | Freq: Two times a day (BID) | RESPIRATORY_TRACT | 0 refills | Status: DC
  Filled 2022-01-14 – 2022-02-01 (×2): qty 10.2, 30d supply, fill #0

## 2022-01-14 MED ORDER — OMEPRAZOLE 20 MG PO CPDR
20.0000 mg | DELAYED_RELEASE_CAPSULE | Freq: Every day | ORAL | 0 refills | Status: DC
  Filled 2022-01-14: qty 30, 30d supply, fill #0

## 2022-01-14 MED ORDER — AMLODIPINE BESYLATE 5 MG PO TABS
5.0000 mg | ORAL_TABLET | Freq: Every day | ORAL | 0 refills | Status: DC
  Filled 2022-01-14: qty 30, 30d supply, fill #0

## 2022-01-14 MED ORDER — MELOXICAM 7.5 MG PO TABS
7.5000 mg | ORAL_TABLET | Freq: Every day | ORAL | 0 refills | Status: DC
  Filled 2022-01-14: qty 30, 30d supply, fill #0

## 2022-01-14 MED ORDER — MONTELUKAST SODIUM 10 MG PO TABS
10.0000 mg | ORAL_TABLET | Freq: Every day | ORAL | 0 refills | Status: DC
  Filled 2022-01-14: qty 30, 30d supply, fill #0

## 2022-01-14 NOTE — ED Provider Notes (Signed)
[facility]    CSN: [id number] Arrival date & time: 01/14/22  1000      History   Chief Complaint Chief Complaint  Patient presents with   Medication Refill    HPI Barbara Adkins is a 71 y.o. female.   Patient presents for medication refill on Singulair, amlodipine, omeprazole, meloxicam, Symbicort.  Patient reports that she has been out of these medications for approximately 1 week.  Last saw primary care doctor approximately 1 year ago but has not been able to get in due to her medical provider being off on leave.  She does have an appointment at the end of this month with a different healthcare provider.  She has been seeing a medical provider from her insurance company that has been coming to her house to refill her medications.  Medications were refilled in October or November 2022.  Has been taking these medications for multiple years and has been tolerating well.  Denies any recent headache, dizziness, blurred vision, nausea, vomiting, chest pain, shortness of breath.  Pertinent medical history includes essential hypertension, asthma, indigestion, arthritis.   Medication Refill  Past Medical History:  Diagnosis Date   Allergy    Arthritis    Deviated septum    Diabetes mellitus without complication (HCC)    GERD (gastroesophageal reflux disease)    Hypertension     Patient Active Problem List   Diagnosis Date Noted   Bilateral primary osteoarthritis of knee 07/17/2019   Unilateral primary osteoarthritis, left knee 02/06/2019   Status post left hip replacement 11/28/2018   Unilateral primary osteoarthritis, left hip 11/21/2018   Weight gain 04/17/2018   Dyslipidemia 04/17/2018   Prediabetes 04/17/2018   Bronchospasm 04/17/2018   Essential hypertension 03/20/2018   Class 1 obesity due to excess calories with serious comorbidity and body mass index (BMI) of 34.0 to 34.9 in adult 03/20/2018   Acute pain of left knee 03/20/2018    Past Surgical  History:  Procedure Laterality Date   ETHMOIDECTOMY Right 05/17/2018   Procedure: RIGHT ETHMOIDECTOMY WITH TISSUE REMOVAL;  Surgeon: Leta Baptist, MD;  Location: [place];  Service: ENT;  Laterality: Right;   FRONTAL SINUS EXPLORATION Right 05/17/2018   Procedure: RIGHT FRONTAL RECESS EXPLORATION;  Surgeon: Leta Baptist, MD;  Location: [place];  Service: ENT;  Laterality: Right;   MAXILLARY ANTROSTOMY Right 05/17/2018   Procedure: RIGHT MAXILLARY ANTROSTOMY WITH TISSUE REMOVAL;  Surgeon: Leta Baptist, MD;  Location: [place];  Service: ENT;  Laterality: Right;   NASAL SEPTOPLASTY W/ TURBINOPLASTY Bilateral 05/17/2018   Procedure: NASAL SEPTOPLASTY WITH TURBINATE REDUCTION;  Surgeon: Leta Baptist, MD;  Location: [place];  Service: ENT;  Laterality: Bilateral;   SINUS ENDO WITH FUSION N/A 05/17/2018   Procedure: SINUS ENDO WITH FUSION;  Surgeon: Leta Baptist, MD;  Location: [place];  Service: ENT;  Laterality: N/A;   SPHENOIDECTOMY Right 05/17/2018   Procedure: RIGHT SPHENOIDECTOMY WITH TISSUE REMOVAL;  Surgeon: Leta Baptist, MD;  Location: [place];  Service: ENT;  Laterality: Right;   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   Procedure: TOTAL HIP ARTHROPLASTY;  Surgeon: Garald Balding, MD;  Location: [place];  Service: Orthopedics;  Laterality: Left;   TUBAL LIGATION      OB History   No obstetric history on file.      Home Medications    Prior to Admission medications   Medication Sig Start Date End Date Taking?  Authorizing Provider  budesonide-formoterol (SYMBICORT) 160-4.5 MCG/ACT inhaler Inhale 2 puffs into the lungs in the morning and at bedtime. 01/14/22  Yes Marcial Pless, Hildred Alamin E, FNP  acetaminophen (TYLENOL) 325 MG tablet Take 2 tablets (650 mg total) by mouth every 6 (six) hours. Patient not taking: Reported on 03/25/2020 11/30/18   Garald Balding, MD  albuterol (VENTOLIN HFA) 108 (90 Base) MCG/ACT inhaler INHALE 2  PUFFS BY MOUTH INTO THE LUNGS EVERY 4 HOURS AS NEEDED FOR WHEEZING, COUGH, OR SHORTNESS OF BREATH 01/25/20   Delia Chimes A, MD  albuterol (VENTOLIN HFA) 108 (90 Base) MCG/ACT inhaler Inhale 2 puffs into the lungs every 4 (four) hours. 04/20/21     amLODipine (NORVASC) 5 MG tablet TAKE 1 TABLET BY MOUTH ONCE DAILY. 08/19/20 08/19/21  Forrest Moron, MD  amLODipine (NORVASC) 5 MG tablet Take 1 tablet (5 mg total) by mouth daily. 01/14/22   Teodora Medici, FNP  atorvastatin (LIPITOR) 20 MG tablet TAKE 1 TABLET BY MOUTH ONCE A DAY 01/25/20   Delia Chimes A, MD  atorvastatin (LIPITOR) 20 MG tablet TAKE 1 TABLET BY MOUTH DAILY 04/15/20 04/15/21  Forrest Moron, MD  atorvastatin (LIPITOR) 20 MG tablet TAKE 1 TABLET BY MOUTH DAILY 04/20/21     Blood Pressure Monitoring (BLOOD PRESSURE KIT) DEVI 1 Device by Does not apply route 3 (three) times a week. Use to check blood pressure three times a week. ICD10 I10 03/05/19   Forrest Moron, MD  cetirizine (ZYRTEC) 10 MG tablet Take 1 tablet (10 mg total) by mouth daily. Patient not taking: Reported on 03/25/2020 11/24/17   Jaynee Eagles, PA-C  fluticasone [facility]) 50 MCG/ACT nasal spray USE 2 SPRAYS IN EACH NOSTRIL DAILY. 01/12/21 01/12/22  Rometta Emery, PA-C  fluticasone (FLONASE) 50 MCG/ACT nasal spray USE 2 SPRAYS IN EACH NOSTRIL DAILY. 04/20/21     influenza vaccine adjuvanted (FLUAD) 0.5 ML injection Inject 0.5 mLs into the muscle. 08/05/21   Carlyle Basques, MD  latanoprost (XALATAN) 0.005 % ophthalmic solution Place 1 drop into both eyes nightly. 05/21/21     losartan (COZAAR) 100 MG tablet TAKE 1 TABLET BY MOUTH EVERY DAY 08/19/20 08/19/21  Delia Chimes A, MD  losartan (COZAAR) 100 MG tablet TAKE 1 TABLET BY MOUTH EVERY DAY 03/13/21     losartan (COZAAR) 100 MG tablet TAKE 1 TABLET BY MOUTH EVERY DAY 04/20/21     losartan (COZAAR) 50 MG tablet TAKE 1 & 1/2 TABLETS BY MOUTH ONCE A DAY 06/10/20   Jacelyn Pi, Lilia Argue, MD  meloxicam (MOBIC) 7.5 MG tablet Take 1 tablet  (7.5 mg total) by mouth daily. 09/25/19   Forrest Moron, MD  meloxicam (MOBIC) 7.5 MG tablet TAKE 1 TABLET BY MOUTH DAILY. 04/20/21     meloxicam (MOBIC) 7.5 MG tablet Take 1 tablet (7.5 mg total) by mouth daily. 01/14/22   Teodora Medici, FNP  metFORMIN (GLUCOPHAGE) 500 MG tablet TAKE 1 TABLET (500 MG TOTAL) BY MOUTH DAILY WITH BREAKFAST. 09/07/19   Forrest Moron, MD  metFORMIN (GLUCOPHAGE) 500 MG tablet TAKE 1 TABLET (500 MG TOTAL) BY MOUTH DAILY WITH BREAKFAST FOR PREDIABETES 08/19/20   Delia Chimes A, MD  metFORMIN (GLUCOPHAGE) 500 MG tablet TAKE 1 TABLET (500 MG TOTAL) BY MOUTH DAILY WITH BREAKFAST FOR PREDIABETES 04/20/21     montelukast (SINGULAIR) 10 MG tablet TAKE 1 TABLET BY MOUTH DAILY 04/20/21     montelukast (SINGULAIR) 10 MG tablet Take 1 tablet (10 mg total) by mouth at  bedtime. 01/14/22   Teodora Medici, FNP  omeprazole (PRILOSEC) 20 MG capsule Take 1 capsule (20 mg total) by mouth daily. 01/14/22   Teodora Medici, FNP  prednisoLONE acetate (PRED FORTE) 1 % ophthalmic suspension Place 1 drop into the left eye 4 times daily. Start using day after surgery as instructed 10/15/21     prednisoLONE sodium phosphate (INFLAMASE FORTE) 1 % ophthalmic solution Place 1 drop into the right eye 4 (four) times daily. To start after surgery 09/22/21       Family History Family History  Problem Relation Age of Onset   Asthma Other    Cancer Other    Hypertension Other    Diabetes Other    Colon cancer Maternal Grandmother    Breast cancer Neg Hx    Esophageal cancer Neg Hx    Rectal cancer Neg Hx    Stomach cancer Neg Hx     Social History Social History   Tobacco Use   Smoking status: Never   Smokeless tobacco: Never  Vaping Use   Vaping Use: Never used  Substance Use Topics   Alcohol use: No   Drug use: Never     Allergies   Patient has no known allergies.   Review of Systems Review of Systems Per HPI  Physical Exam Triage Vital Signs ED Triage Vitals  Enc Vitals Group      BP 01/14/22 1013 (!) 172/122     Pulse Rate 01/14/22 1013 72     Resp 01/14/22 1013 16     Temp 01/14/22 1013 98.1 F (36.7 C)     Temp Source 01/14/22 1013 Oral     SpO2 01/14/22 1013 94 %     Weight --      Height --      Head Circumference --      Peak Flow --      Pain Score 01/14/22 1016 0     Pain Loc --      Pain Edu? --      Excl. in [place]? --    No data found.  Updated Vital Signs BP (!) 172/122 (BP Location: Left Arm)    Pulse 72    Temp 98.1 F (36.7 C) (Oral)    Resp 16    SpO2 94%   Visual Acuity Right Eye Distance:   Left Eye Distance:   Bilateral Distance:    Right Eye Near:   Left Eye Near:    Bilateral Near:     Physical Exam Constitutional:      General: She is not in acute distress.    Appearance: Normal appearance. She is not toxic-appearing or diaphoretic.  HENT:     Head: Normocephalic and atraumatic.  Eyes:     Extraocular Movements: Extraocular movements intact.     Conjunctiva/sclera: Conjunctivae normal.  Cardiovascular:     Rate and Rhythm: Normal rate and regular rhythm.     Pulses: Normal pulses.     Heart sounds: Normal heart sounds.  Pulmonary:     Effort: Pulmonary effort is normal. No respiratory distress.     Breath sounds: Normal breath sounds.  Neurological:     General: No focal deficit present.     Mental Status: She is alert and oriented to person, place, and time. Mental status is at baseline.     Cranial Nerves: Cranial nerves 2-12 are intact.     Sensory: Sensation is intact.     Motor: Motor function is intact.  Coordination: Coordination is intact.     Gait: Gait is intact.  Psychiatric:        Mood and Affect: Mood normal.        Behavior: Behavior normal.        Thought Content: Thought content normal.        Judgment: Judgment normal.     UC Treatments / Results  Labs (all labs ordered are listed, but only abnormal results are displayed) Labs Reviewed - No data to display  EKG   Radiology No  results found.  Procedures Procedures (including critical care time)  Medications Ordered in UC Medications - No data to display  Initial Impression / Assessment and Plan / UC Course  I have reviewed the triage vital signs and the nursing notes.  Pertinent labs & imaging results that were available during my care of the patient were reviewed by me and considered in my medical decision making (see chart for details).     Physical exam appears benign.  Blood pressure is elevated as well given that patient is out of her blood pressure medication. No signs of hypertensive urgency at this time. Will refill medications at last known dose.  Medication Bottles that patient has present with her have last fill date of November 2022 as well as September 2022.  Patient to restart medications as prescribed.  Advised patient of the importance of following up with primary care doctor for further evaluation and management.  Discussed return precautions.  Patient verbalized understanding and was agreeable with plan. Final Clinical Impressions(s) / UC Diagnoses   Final diagnoses:  Essential hypertension  Mild intermittent asthma without complication  Medication refill     Discharge Instructions      Your medications have been refilled.  Please follow-up with primary care doctor for further evaluation and management.    ED Prescriptions     Medication Sig Dispense Auth. Provider   amLODipine (NORVASC) 5 MG tablet Take 1 tablet (5 mg total) by mouth daily. 30 tablet [place], [place] E, [place]   meloxicam (MOBIC) 7.5 MG tablet Take 1 tablet (7.5 mg total) by mouth daily. 30 tablet [place], [place] E, [place]   montelukast (SINGULAIR) 10 MG tablet Take 1 tablet (10 mg total) by mouth at bedtime. 30 tablet [place], [place] E, [place]   omeprazole (PRILOSEC) 20 MG capsule Take 1 capsule (20 mg total) by mouth daily. 30 capsule [place], [place] E, [place]   budesonide-formoterol [facility]) 160-4.5 MCG/ACT inhaler Inhale 2 puffs  into the lungs in the morning and at bedtime. 10.2 g Teodora Medici, [place]      PDMP not reviewed this encounter.   Teodora Medici,  01/14/22 1040

## 2022-01-14 NOTE — ED Triage Notes (Signed)
Needs refills on all of her medications. Brought 4 bottles she needs specifically filled. Montelukast, amlodipine, omeprazole, meloxicam, and symbicort ?

## 2022-01-14 NOTE — Discharge Instructions (Signed)
Your medications have been refilled.  Please follow-up with primary care doctor for further evaluation and management. ?

## 2022-01-22 ENCOUNTER — Encounter: Payer: Self-pay | Admitting: Nurse Practitioner

## 2022-01-22 ENCOUNTER — Other Ambulatory Visit: Payer: Self-pay

## 2022-01-22 ENCOUNTER — Ambulatory Visit (INDEPENDENT_AMBULATORY_CARE_PROVIDER_SITE_OTHER): Payer: Medicare Other | Admitting: Nurse Practitioner

## 2022-01-22 VITALS — BP 167/99 | HR 69 | Ht 63.0 in | Wt 185.0 lb

## 2022-01-22 DIAGNOSIS — I10 Essential (primary) hypertension: Secondary | ICD-10-CM

## 2022-01-22 NOTE — Assessment & Plan Note (Signed)
ED follow up ?Elevated blood pressure: ? ? ?Low salt diet ? ?Start walking routine ? ?Stay well hydrated ? ?Follow up: ? ?Follow up in 2-4 weeks for complete physical ?

## 2022-01-22 NOTE — Progress Notes (Signed)
$'@Patient'Y$  ID: Barbara Adkins, female    DOB: [birth year], 71 y.o.   MRN: [id number]  Chief Complaint  Patient presents with   Hospitalization Follow-up    Referring provider: Forrest Moron, MD  HPI  Patient presents today for ED follow-up.  Patient was seen in the urgent care last week.  She needed refills on her blood pressure medications.  Patient does not currently have a PCP.  She would like to establish care at this office.  Patient was able to get medications refilled.  We will update her medication list during visit today.  Patient admits that she does not follow a low-sodium diet and does not exercise.  We discussed that this is important.  Handouts given today.  Patient advised to keep a blood pressure log for next visit.  We will have her return in 2 to 4 weeks for a complete physical. Denies f/c/s, n/v/d, hemoptysis, PND, chest pain or edema.      No Known Allergies  Immunization History  Administered Date(s) Administered   Fluad Quad(high Dose 65+) 08/21/2019, 07/24/2020, 08/06/2021   Influenza, High Dose Seasonal PF 08/12/2018   Influenza,inj,Quad PF,6+ Mos 11/01/2017   Pneumococcal Conjugate-13 09/25/2019   Pneumococcal Polysaccharide-23 08/28/2018   Tdap 08/30/2018, 08/30/2018   Zoster Recombinat (Shingrix) 12/07/2019, 03/05/2020    Past Medical History:  Diagnosis Date   Allergy    Arthritis    Deviated septum    Diabetes mellitus without complication (HCC)    GERD (gastroesophageal reflux disease)    Hypertension     Tobacco History: Social History   Tobacco Use  Smoking Status Never  Smokeless Tobacco Never   Counseling given: Not Answered   Outpatient Encounter Medications as of 01/22/2022  Medication Sig   metFORMIN (GLUCOPHAGE) 500 MG tablet Take 500 mg by mouth daily with breakfast.   acetaminophen (TYLENOL) 325 MG tablet Take 2 tablets (650 mg total) by mouth every 6 (six) hours. (Patient not taking: Reported on 03/25/2020)   albuterol  (VENTOLIN HFA) 108 (90 Base) MCG/ACT inhaler Inhale 2 puffs into the lungs every 4 (four) hours.   amLODipine (NORVASC) 5 MG tablet TAKE 1 TABLET BY MOUTH ONCE DAILY.   atorvastatin (LIPITOR) 20 MG tablet TAKE 1 TABLET BY MOUTH DAILY   Blood Pressure Monitoring (BLOOD PRESSURE KIT) DEVI 1 Device by Does not apply route 3 (three) times a week. Use to check blood pressure three times a week. ICD10 I10   budesonide-formoterol (SYMBICORT) 160-4.5 MCG/ACT inhaler Inhale 2 puffs into the lungs in the morning and at bedtime.   cetirizine (ZYRTEC) 10 MG tablet Take 1 tablet (10 mg total) by mouth daily. (Patient not taking: Reported on 03/25/2020)   fluticasone (FLONASE) 50 MCG/ACT nasal spray USE 2 SPRAYS IN EACH NOSTRIL DAILY.   influenza vaccine adjuvanted (FLUAD) 0.5 ML injection Inject 0.5 mLs into the muscle.   latanoprost (XALATAN) 0.005 % ophthalmic solution Place 1 drop into both eyes nightly.   losartan (COZAAR) 100 MG tablet TAKE 1 TABLET BY MOUTH EVERY DAY   meloxicam (MOBIC) 7.5 MG tablet Take 1 tablet (7.5 mg total) by mouth daily.   montelukast (SINGULAIR) 10 MG tablet Take 1 tablet (10 mg total) by mouth at bedtime.   omeprazole (PRILOSEC) 20 MG capsule Take 1 capsule (20 mg total) by mouth daily.   prednisoLONE acetate (PRED FORTE) 1 % ophthalmic suspension Place 1 drop into the left eye 4 times daily. Start using day after surgery as instructed   prednisoLONE sodium  phosphate (INFLAMASE FORTE) 1 % ophthalmic solution Place 1 drop into the right eye 4 (four) times daily. To start after surgery   [DISCONTINUED] albuterol (VENTOLIN HFA) 108 (90 Base) MCG/ACT inhaler INHALE 2 PUFFS BY MOUTH INTO THE LUNGS EVERY 4 HOURS AS NEEDED FOR WHEEZING, COUGH, OR SHORTNESS OF BREATH   [DISCONTINUED] amLODipine (NORVASC) 5 MG tablet Take 1 tablet (5 mg total) by mouth daily.   [DISCONTINUED] atorvastatin (LIPITOR) 20 MG tablet TAKE 1 TABLET BY MOUTH ONCE A DAY   [DISCONTINUED] atorvastatin (LIPITOR) 20 MG  tablet TAKE 1 TABLET BY MOUTH DAILY   [DISCONTINUED] fluticasone (FLONASE) 50 MCG/ACT nasal spray USE 2 SPRAYS IN EACH NOSTRIL DAILY.   [DISCONTINUED] losartan (COZAAR) 100 MG tablet TAKE 1 TABLET BY MOUTH EVERY DAY   [DISCONTINUED] losartan (COZAAR) 100 MG tablet TAKE 1 TABLET BY MOUTH EVERY DAY   [DISCONTINUED] losartan (COZAAR) 50 MG tablet TAKE 1 & 1/2 TABLETS BY MOUTH ONCE A DAY   [DISCONTINUED] meloxicam (MOBIC) 7.5 MG tablet Take 1 tablet (7.5 mg total) by mouth daily.   [DISCONTINUED] meloxicam (MOBIC) 7.5 MG tablet TAKE 1 TABLET BY MOUTH DAILY.   [DISCONTINUED] metFORMIN (GLUCOPHAGE) 500 MG tablet TAKE 1 TABLET (500 MG TOTAL) BY MOUTH DAILY WITH BREAKFAST.   [DISCONTINUED] metFORMIN (GLUCOPHAGE) 500 MG tablet TAKE 1 TABLET (500 MG TOTAL) BY MOUTH DAILY WITH BREAKFAST FOR PREDIABETES   [DISCONTINUED] metFORMIN (GLUCOPHAGE) 500 MG tablet TAKE 1 TABLET (500 MG TOTAL) BY MOUTH DAILY WITH BREAKFAST FOR PREDIABETES   [DISCONTINUED] montelukast (SINGULAIR) 10 MG tablet TAKE 1 TABLET BY MOUTH DAILY   No facility-administered encounter medications on file as of 01/22/2022.     Review of Systems  Review of Systems  Constitutional: Negative.   HENT: Negative.    Cardiovascular: Negative.   Gastrointestinal: Negative.   Allergic/Immunologic: Negative.   Neurological: Negative.   Psychiatric/Behavioral: Negative.        Physical Exam  BP (!) 167/99    Pulse 69    Ht $R'5\' 3"'sy$  (1.6 m)    Wt 185 lb (83.9 kg)    SpO2 99%    BMI 32.77 kg/m   Wt Readings from Last 5 Encounters:  01/22/22 185 lb (83.9 kg)  06/13/20 179 lb 14.3 oz (81.6 kg)  09/25/19 181 lb 12.8 oz (82.5 kg)  07/17/19 185 lb (83.9 kg)  02/06/19 185 lb (83.9 kg)     Physical Exam Vitals and nursing note reviewed.  Constitutional:      General: She is not in acute distress.    Appearance: She is well-developed.  Cardiovascular:     Rate and Rhythm: Normal rate and regular rhythm.  Pulmonary:     Effort: Pulmonary  effort is normal.     Breath sounds: Normal breath sounds.  Neurological:     Mental Status: She is alert and oriented to person, place, and time.     Lab Results:  CBC    Component Value Date/Time   WBC 11.0 (H) 11/30/2018 0213   RBC 4.85 11/30/2018 0213   HGB 9.7 (L) 11/30/2018 0213   HGB 11.7 10/23/2018 1111   HCT 33.8 (L) 11/30/2018 0213   HCT 40.7 10/23/2018 1111   PLT 258 11/30/2018 0213   PLT 392 10/23/2018 1111   MCV 69.7 (L) 11/30/2018 0213   MCV 70 (L) 10/23/2018 1111   MCH 20.0 (L) 11/30/2018 0213   MCHC 28.7 (L) 11/30/2018 0213   RDW 16.2 (H) 11/30/2018 0213   RDW 15.5 (H) 10/23/2018 1111  LYMPHSABS 1.6 11/20/2018 0927   MONOABS 0.5 11/20/2018 0927   EOSABS 0.3 11/20/2018 0927   BASOSABS 0.1 11/20/2018 0927    BMET    Component Value Date/Time   NA 141 09/25/2019 1131   K 4.3 09/25/2019 1131   CL 101 09/25/2019 1131   CO2 26 09/25/2019 1131   GLUCOSE 84 09/25/2019 1131   GLUCOSE 98 11/30/2018 0213   BUN 18 09/25/2019 1131   CREATININE 1.07 (H) 09/25/2019 1131   CALCIUM 9.3 09/25/2019 1131   GFRNONAA 53 (L) 09/25/2019 1131   GFRAA 62 09/25/2019 1131    BNP No results found for: BNP  ProBNP No results found for: PROBNP  Imaging: No results found.   Assessment & Plan:   Essential hypertension ED follow up Elevated blood pressure:   Low salt diet  Start walking routine  Stay well hydrated  Follow up:  Follow up in 2-4 weeks for complete physical  Patient Instructions  ED follow up Elevated blood pressure:   Low salt diet  Start walking routine  Stay well hydrated  Follow up:  Follow up in 2-4 weeks for complete physical   Blood Pressure Record Sheet To take your blood pressure, you will need a blood pressure machine. You can buy a blood pressure machine (blood pressure monitor) at your clinic, drug store, or online. When choosing one, consider: An automatic monitor that has an arm cuff. A cuff that wraps snugly  around your upper arm. You should be able to fit only one finger between your arm and the cuff. A device that stores blood pressure reading results. Do not choose a monitor that measures your blood pressure from your wrist or finger. Follow your health care provider's instructions for how to take your blood pressure. To use this form: Get one reading in the morning (a.m.) before you take any medicines. Get one reading in the evening (p.m.) before supper. Take at least 2 readings with each blood pressure check. This makes sure the results are correct. Wait 1-2 minutes between measurements. Write down the results in the spaces on this form. Repeat this once a week, or as told by your health care provider. Make a follow-up appointment with your health care provider to discuss the results. Blood pressure log Date: _______________________ a.m. _____________________(1st reading) _____________________(2nd reading) p.m. _____________________(1st reading) _____________________(2nd reading) Date: _______________________ a.m. _____________________(1st reading) _____________________(2nd reading) p.m. _____________________(1st reading) _____________________(2nd reading) Date: _______________________ a.m. _____________________(1st reading) _____________________(2nd reading) p.m. _____________________(1st reading) _____________________(2nd reading) Date: _______________________ a.m. _____________________(1st reading) _____________________(2nd reading) p.m. _____________________(1st reading) _____________________(2nd reading) Date: _______________________ a.m. _____________________(1st reading) _____________________(2nd reading) p.m. _____________________(1st reading) _____________________(2nd reading) This information is not intended to replace advice given to you by your health care provider. Make sure you discuss any questions you have with your health care provider. Document Revised: 02/19/2020  Document Reviewed: 02/20/2020 Elsevier Patient Education  2022 [place] With Less [employer] with less salt is one way to reduce the amount of sodium you get from food. Sodium is one of the elements that make up salt. It is found naturally in foods and is also added to certain foods. Depending on your condition and overall health, your health care provider or dietitian may recommend that you reduce your sodium intake. Most people should have less than 2,300 milligrams (mg) of sodium each day. If you have high blood pressure (hypertension), you may need to limit your sodium to 1,500 mg each day. Follow the tips below to help reduce your sodium  intake. What are tips for eating less sodium? Reading food labels  Check the food label before buying or using packaged ingredients. Always check the label for the serving size and sodium content. Look for products with no more than 140 mg of sodium in one serving. Check the % Daily Value column to see what percent of the daily recommended amount of sodium is provided in one serving of the product. Foods with 5% or less in this column are considered low in sodium. Foods with 20% or higher are considered high in sodium. Do not choose foods with salt as one of the first three ingredients on the ingredients list. If salt is one of the first three ingredients, it usually means the item is high in sodium. Shopping Buy sodium-free or low-sodium products. Look for the following words on food labels: Low-sodium. Sodium-free. Reduced-sodium. No salt added. Unsalted. Always check the sodium content even if foods are labeled as low-sodium or no salt added. Buy fresh foods. Cooking Use herbs, seasonings without salt, and spices as substitutes for salt. Use sodium-free baking soda when baking. Grill, braise, or roast foods to add flavor with less salt. Avoid adding salt to pasta, rice, or hot cereals. Drain and rinse canned vegetables, beans, and  meat before use. Avoid adding salt when cooking sweets and desserts. Cook with low-sodium ingredients. What foods are high in sodium? Vegetables Regular canned vegetables (not low-sodium or reduced-sodium). Sauerkraut, pickled vegetables, and relishes. Olives. [place] fries. Onion rings. Regular canned tomato sauce and paste. Regular tomato and vegetable juice. Frozen vegetables in sauces. Grains Instant hot cereals. Bread stuffing, pancake, and biscuit mixes. Croutons. Seasoned rice or pasta mixes. Noodle soup cups. Boxed or frozen macaroni and cheese. Regular salted crackers. Self-rising flour. Rolls. Bagels. Flour tortillas and wraps. Meats and other proteins Meat or fish that is salted, canned, smoked, cured, spiced, or pickled. This includes bacon, ham, sausages, hot dogs, corned beef, chipped beef, meat loaves, salt pork, jerky, pickled herring, anchovies, regular canned tuna, and sardines. Salted nuts. Dairy Processed cheese and cheese spreads. Cheese curds. Blue cheese. Feta cheese. String cheese. Regular cottage cheese. Buttermilk. Canned milk. The items listed above may not be a complete list of foods high in sodium. Actual amounts of sodium may be different depending on processing. Contact a dietitian for more information. What foods are low in sodium? Fruits Fresh, frozen, or canned fruit with no sauce added. Fruit juice. Vegetables Fresh or frozen vegetables with no sauce added. "No salt added" canned vegetables. "No salt added" tomato sauce and paste. Low-sodium or reduced-sodium tomato and vegetable juice. Grains Noodles, pasta, quinoa, rice. Shredded or puffed wheat or puffed rice. Regular or quick oats (not instant). Low-sodium crackers. Low-sodium bread. Whole-grain bread and whole-grain pasta. Unsalted popcorn. Meats and other proteins Fresh or frozen whole meats, poultry (not injected with sodium), and fish with no sauce added. Unsalted nuts. Dried peas, beans, and lentils  without added salt. Unsalted canned beans. Eggs. Unsalted nut butters. Low-sodium canned tuna or chicken. Dairy Milk. Soy milk. Yogurt. Low-sodium cheeses, such as Swiss, Monterey Jack, [place], and [employer]. Sherbet or ice cream (keep to  cup per serving). Cream cheese. Fats and oils Unsalted butter or margarine. Other foods Homemade pudding. Sodium-free baking soda and baking powder. Herbs and spices. Low-sodium seasoning mixes. Beverages Coffee and tea. Carbonated beverages. The items listed above may not be a complete list of foods low in sodium. Actual amounts of sodium may be different depending on processing. Contact a  dietitian for more information. What are some salt alternatives when cooking? The following are herbs, seasonings, and spices that can be used instead of salt to flavor your food. Herbs should be fresh or dried. Do not choose packaged mixes. Next to the name of the herb, spice, or seasoning are some examples of foods you can pair it with. Herbs [place] leaves - Soups, meat and vegetable dishes, and spaghetti sauce. Basil - [employer], soups, pasta, and fish dishes. Cilantro - Meat, poultry, and vegetable dishes. Chili powder - Marinades and Mexican dishes. Chives - Salad dressings and potato dishes. Cumin - Mexican dishes, couscous, and meat dishes. Dill - Fish dishes, sauces, and salads. Fennel - Meat and vegetable dishes, breads, and cookies. Garlic (do not use garlic salt) - [place] dishes, meat dishes, salad dressings, and sauces. Marjoram - Soups, potato dishes, and meat dishes. Oregano - Pizza and spaghetti sauce. Parsley - Salads, soups, pasta, and meat dishes. Rosemary - [place] dishes, salad dressings, soups, and red meats. Saffron - Fish dishes, pasta, and some poultry dishes. Sage - Stuffings and sauces. Tarragon - Fish and [employer]. Thyme - Stuffing, meat, and fish dishes. Seasonings Lemon juice - Fish dishes, poultry dishes, vegetables, and  salads. Vinegar - Salad dressings, vegetables, and fish dishes. Spices Cinnamon - Sweet dishes, such as cakes, cookies, and puddings. Cloves - Gingerbread, puddings, and marinades for meats. Curry - Vegetable dishes, fish and poultry dishes, and stir-fry dishes. Ginger - Vegetable dishes, fish dishes, and stir-fry dishes. Nutmeg - Pasta, vegetables, poultry, fish dishes, and custard. Summary Cooking with less salt is one way to reduce the amount of sodium that you get from food. Buy sodium-free or low-sodium products. Check the food label before using or buying packaged ingredients. Use herbs, seasonings without salt, and spices as substitutes for salt in foods. This information is not intended to replace advice given to you by your health care provider. Make sure you discuss any questions you have with your health care provider. Document Revised: 10/24/2019 Document Reviewed: 10/24/2019 Elsevier Patient Education  2022 [place] Eating Plan DASH stands for Dietary Approaches to Stop Hypertension. The DASH eating plan is a healthy eating plan that has been shown to: Reduce high blood pressure (hypertension). Reduce your risk for type 2 diabetes, heart disease, and stroke. Help with weight loss. What are tips for following this plan? Reading food labels Check food labels for the amount of salt (sodium) per serving. Choose foods with less than 5 percent of the Daily Value of sodium. Generally, foods with less than 300 milligrams (mg) of sodium per serving fit into this eating plan. To find whole grains, look for the word "whole" as the first word in the ingredient list. Shopping Buy products labeled as "low-sodium" or "no salt added." Buy fresh foods. Avoid canned foods and pre-made or frozen meals. Cooking Avoid adding salt when cooking. Use salt-free seasonings or herbs instead of table salt or sea salt. Check with your health care provider or pharmacist before using salt  substitutes. Do not fry foods. Cook foods using healthy methods such as baking, boiling, grilling, roasting, and broiling instead. Cook with heart-healthy oils, such as olive, canola, avocado, soybean, or sunflower oil. Meal planning  Eat a balanced diet that includes: 4 or more servings of fruits and 4 or more servings of vegetables each day. Try to fill one-half of your plate with fruits and vegetables. 6-8 servings of whole grains each day. Less than 6  oz (170 g) of lean meat, poultry, or fish each day. A 3-oz (85-g) serving of meat is about the same size as a deck of cards. One egg equals 1 oz (28 g). 2-3 servings of low-fat dairy each day. One serving is 1 cup (237 mL). 1 serving of nuts, seeds, or beans 5 times each week. 2-3 servings of heart-healthy fats. Healthy fats called omega-3 fatty acids are found in foods such as walnuts, flaxseeds, fortified milks, and eggs. These fats are also found in cold-water fish, such as sardines, salmon, and mackerel. Limit how much you eat of: Canned or prepackaged foods. Food that is high in trans fat, such as some fried foods. Food that is high in saturated fat, such as fatty meat. Desserts and other sweets, sugary drinks, and other foods with added sugar. Full-fat dairy products. Do not salt foods before eating. Do not eat more than 4 egg yolks a week. Try to eat at least 2 vegetarian meals a week. Eat more home-cooked food and less restaurant, buffet, and fast food. Lifestyle When eating at a restaurant, ask that your food be prepared with less salt or no salt, if possible. If you drink alcohol: Limit how much you use to: 0-1 drink a day for women who are not pregnant. 0-2 drinks a day for men. Be aware of how much alcohol is in your drink. In the [place] one drink equals one 12 oz bottle of beer (355 mL), one 5 oz glass of wine (148 mL), or one 1 oz glass of hard liquor (44 mL). General information Avoid eating more than 2,300 mg of salt a  day. If you have hypertension, you may need to reduce your sodium intake to 1,500 mg a day. Work with your health care provider to maintain a healthy body weight or to lose weight. Ask what an ideal weight is for you. Get at least 30 minutes of exercise that causes your heart to beat faster (aerobic exercise) most days of the week. Activities may include walking, swimming, or biking. Work with your health care provider or dietitian to adjust your eating plan to your individual calorie needs. What foods should I eat? Fruits All fresh, dried, or frozen fruit. Canned fruit in natural juice (without added sugar). Vegetables Fresh or frozen vegetables (raw, steamed, roasted, or grilled). Low-sodium or reduced-sodium tomato and vegetable juice. Low-sodium or reduced-sodium tomato sauce and tomato paste. Low-sodium or reduced-sodium canned vegetables. Grains Whole-grain or whole-wheat bread. Whole-grain or whole-wheat pasta. Brown rice. [place]. Bulgur. Whole-grain and low-sodium cereals. Pita bread. Low-fat, low-sodium crackers. Whole-wheat flour tortillas. Meats and other proteins Skinless chicken or [place]. Ground chicken or [place]. Pork with fat trimmed off. Fish and seafood. Egg whites. Dried beans, peas, or lentils. Unsalted nuts, nut butters, and seeds. Unsalted canned beans. Lean cuts of beef with fat trimmed off. Low-sodium, lean precooked or cured meat, such as sausages or meat loaves. Dairy Low-fat (1%) or fat-free (skim) milk. Reduced-fat, low-fat, or fat-free cheeses. Nonfat, low-sodium ricotta or cottage cheese. Low-fat or nonfat yogurt. Low-fat, low-sodium cheese. Fats and oils Soft margarine without trans fats. Vegetable oil. Reduced-fat, low-fat, or light mayonnaise and salad dressings (reduced-sodium). Canola, safflower, olive, avocado, soybean, and sunflower oils. Avocado. Seasonings and condiments Herbs. Spices. Seasoning mixes without salt. Other foods Unsalted popcorn and  pretzels. Fat-free sweets. The items listed above may not be a complete list of foods and beverages you can eat. Contact a dietitian for more information. What foods should I avoid?  Fruits Canned fruit in a light or heavy syrup. Fried fruit. Fruit in cream or butter sauce. Vegetables Creamed or fried vegetables. Vegetables in a cheese sauce. Regular canned vegetables (not low-sodium or reduced-sodium). Regular canned tomato sauce and paste (not low-sodium or reduced-sodium). Regular tomato and vegetable juice (not low-sodium or reduced-sodium). Angie Fava. Olives. Grains Baked goods made with fat, such as croissants, muffins, or some breads. Dry pasta or rice meal packs. Meats and other proteins Fatty cuts of meat. Ribs. Fried meat. Berniece Salines. Bologna, salami, and other precooked or cured meats, such as sausages or meat loaves. Fat from the back of a pig (fatback). Bratwurst. Salted nuts and seeds. Canned beans with added salt. Canned or smoked fish. Whole eggs or egg yolks. Chicken or [place] with skin. Dairy Whole or 2% milk, cream, and half-and-half. Whole or full-fat cream cheese. Whole-fat or sweetened yogurt. Full-fat cheese. Nondairy creamers. Whipped toppings. Processed cheese and cheese spreads. Fats and oils Butter. Stick margarine. Lard. Shortening. Ghee. Bacon fat. Tropical oils, such as coconut, palm kernel, or palm oil. Seasonings and condiments Onion salt, garlic salt, seasoned salt, table salt, and sea salt. Worcestershire sauce. Tartar sauce. Barbecue sauce. Teriyaki sauce. Soy sauce, including reduced-sodium. Steak sauce. Canned and packaged gravies. Fish sauce. Oyster sauce. Cocktail sauce. Store-bought horseradish. Ketchup. Mustard. Meat flavorings and tenderizers. Bouillon cubes. Hot sauces. Pre-made or packaged marinades. Pre-made or packaged taco seasonings. Relishes. Regular salad dressings. Other foods Salted popcorn and pretzels. The items listed above may not be a complete list  of foods and beverages you should avoid. Contact a dietitian for more information. Where to find more information National Heart, Lung, and Blood Institute: https://wilson-eaton.com/ American Heart Association: www.heart.org Academy of Nutrition and Dietetics: www.eatright.[place]: www.kidney.org Summary The DASH eating plan is a healthy eating plan that has been shown to reduce high blood pressure (hypertension). It may also reduce your risk for type 2 diabetes, heart disease, and stroke. When on the DASH eating plan, aim to eat more fresh fruits and vegetables, whole grains, lean proteins, low-fat dairy, and heart-healthy fats. With the DASH eating plan, you should limit salt (sodium) intake to 2,300 mg a day. If you have hypertension, you may need to reduce your sodium intake to 1,500 mg a day. Work with your health care provider or dietitian to adjust your eating plan to your individual calorie needs. This information is not intended to replace advice given to you by your health care provider. Make sure you discuss any questions you have with your health care provider. Document Revised: 10/05/2019 Document Reviewed: 10/05/2019 Elsevier Patient Education  2022 [place], [place] 01/22/2022

## 2022-01-22 NOTE — Patient Instructions (Addendum)
ED follow up Elevated blood pressure:   Low salt diet  Start walking routine  Stay well hydrated  Follow up:  Follow up in 2-4 weeks for complete physical   Blood Pressure Record Sheet To take your blood pressure, you will need a blood pressure machine. You can buy a blood pressure machine (blood pressure monitor) at your clinic, drug store, or online. When choosing one, consider: An automatic monitor that has an arm cuff. A cuff that wraps snugly around your upper arm. You should be able to fit only one finger between your arm and the cuff. A device that stores blood pressure reading results. Do not choose a monitor that measures your blood pressure from your wrist or finger. Follow your health care provider's instructions for how to take your blood pressure. To use this form: Get one reading in the morning (a.m.) before you take any medicines. Get one reading in the evening (p.m.) before supper. Take at least 2 readings with each blood pressure check. This makes sure the results are correct. Wait 1-2 minutes between measurements. Write down the results in the spaces on this form. Repeat this once a week, or as told by your health care provider. Make a follow-up appointment with your health care provider to discuss the results. Blood pressure log Date: _______________________ a.m. _____________________(1st reading) _____________________(2nd reading) p.m. _____________________(1st reading) _____________________(2nd reading) Date: _______________________ a.m. _____________________(1st reading) _____________________(2nd reading) p.m. _____________________(1st reading) _____________________(2nd reading) Date: _______________________ a.m. _____________________(1st reading) _____________________(2nd reading) p.m. _____________________(1st reading) _____________________(2nd reading) Date: _______________________ a.m. _____________________(1st reading) _____________________(2nd  reading) p.m. _____________________(1st reading) _____________________(2nd reading) Date: _______________________ a.m. _____________________(1st reading) _____________________(2nd reading) p.m. _____________________(1st reading) _____________________(2nd reading) This information is not intended to replace advice given to you by your health care provider. Make sure you discuss any questions you have with your health care provider. Document Revised: 02/19/2020 Document Reviewed: 02/20/2020 Elsevier Patient Education  2022 [place] With Less [employer] with less salt is one way to reduce the amount of sodium you get from food. Sodium is one of the elements that make up salt. It is found naturally in foods and is also added to certain foods. Depending on your condition and overall health, your health care provider or dietitian may recommend that you reduce your sodium intake. Most people should have less than 2,300 milligrams (mg) of sodium each day. If you have high blood pressure (hypertension), you may need to limit your sodium to 1,500 mg each day. Follow the tips below to help reduce your sodium intake. What are tips for eating less sodium? Reading food labels  Check the food label before buying or using packaged ingredients. Always check the label for the serving size and sodium content. Look for products with no more than 140 mg of sodium in one serving. Check the % Daily Value column to see what percent of the daily recommended amount of sodium is provided in one serving of the product. Foods with 5% or less in this column are considered low in sodium. Foods with 20% or higher are considered high in sodium. Do not choose foods with salt as one of the first three ingredients on the ingredients list. If salt is one of the first three ingredients, it usually means the item is high in sodium. Shopping Buy sodium-free or low-sodium products. Look for the following words on food  labels: Low-sodium. Sodium-free. Reduced-sodium. No salt added. Unsalted. Always check the sodium content even if foods are labeled as low-sodium or no  salt added. Buy fresh foods. Cooking Use herbs, seasonings without salt, and spices as substitutes for salt. Use sodium-free baking soda when baking. Grill, braise, or roast foods to add flavor with less salt. Avoid adding salt to pasta, rice, or hot cereals. Drain and rinse canned vegetables, beans, and meat before use. Avoid adding salt when cooking sweets and desserts. Cook with low-sodium ingredients. What foods are high in sodium? Vegetables Regular canned vegetables (not low-sodium or reduced-sodium). Sauerkraut, pickled vegetables, and relishes. Olives. [place] fries. Onion rings. Regular canned tomato sauce and paste. Regular tomato and vegetable juice. Frozen vegetables in sauces. Grains Instant hot cereals. Bread stuffing, pancake, and biscuit mixes. Croutons. Seasoned rice or pasta mixes. Noodle soup cups. Boxed or frozen macaroni and cheese. Regular salted crackers. Self-rising flour. Rolls. Bagels. Flour tortillas and wraps. Meats and other proteins Meat or fish that is salted, canned, smoked, cured, spiced, or pickled. This includes bacon, ham, sausages, hot dogs, corned beef, chipped beef, meat loaves, salt pork, jerky, pickled herring, anchovies, regular canned tuna, and sardines. Salted nuts. Dairy Processed cheese and cheese spreads. Cheese curds. Blue cheese. Feta cheese. String cheese. Regular cottage cheese. Buttermilk. Canned milk. The items listed above may not be a complete list of foods high in sodium. Actual amounts of sodium may be different depending on processing. Contact a dietitian for more information. What foods are low in sodium? Fruits Fresh, frozen, or canned fruit with no sauce added. Fruit juice. Vegetables Fresh or frozen vegetables with no sauce added. "No salt added" canned vegetables. "No salt  added" tomato sauce and paste. Low-sodium or reduced-sodium tomato and vegetable juice. Grains Noodles, pasta, quinoa, rice. Shredded or puffed wheat or puffed rice. Regular or quick oats (not instant). Low-sodium crackers. Low-sodium bread. Whole-grain bread and whole-grain pasta. Unsalted popcorn. Meats and other proteins Fresh or frozen whole meats, poultry (not injected with sodium), and fish with no sauce added. Unsalted nuts. Dried peas, beans, and lentils without added salt. Unsalted canned beans. Eggs. Unsalted nut butters. Low-sodium canned tuna or chicken. Dairy Milk. Soy milk. Yogurt. Low-sodium cheeses, such as Swiss, Monte[place] Jack, [place], and [employer]. Sherbet or ice cream (keep to  cup per serving). Cream cheese. Fats and oils Unsalted butter or margarine. Other foods Homemade pudding. Sodium-free baking soda and baking powder. Herbs and spices. Low-sodium seasoning mixes. Beverages Coffee and tea. Carbonated beverages. The items listed above may not be a complete list of foods low in sodium. Actual amounts of sodium may be different depending on processing. Contact a dietitian for more information. What are some salt alternatives when cooking? The following are herbs, seasonings, and spices that can be used instead of salt to flavor your food. Herbs should be fresh or dried. Do not choose packaged mixes. Next to the name of the herb, spice, or seasoning are some examples of foods you can pair it with. Herbs [place] leaves - Soups, meat and vegetable dishes, and spaghetti sauce. Basil - [employer], soups, pasta, and fish dishes. Cilantro - Meat, poultry, and vegetable dishes. Chili powder - Marinades and Mexican dishes. Chives - Salad dressings and potato dishes. Cumin - Mexican dishes, couscous, and meat dishes. Dill - Fish dishes, sauces, and salads. Fennel - Meat and vegetable dishes, breads, and cookies. Garlic (do not use garlic salt) - [place] dishes, meat dishes,  salad dressings, and sauces. Marjoram - Soups, potato dishes, and meat dishes. Oregano - Pizza and spaghetti sauce. Parsley - Salads, soups, pasta, and meat dishes. Francesca Jewett -  [place] dishes, salad dressings, soups, and red meats. Saffron - Fish dishes, pasta, and some poultry dishes. Sage - Stuffings and sauces. Tarragon - Fish and [employer]. Thyme - Stuffing, meat, and fish dishes. Seasonings Lemon juice - Fish dishes, poultry dishes, vegetables, and salads. Vinegar - Salad dressings, vegetables, and fish dishes. Spices Cinnamon - Sweet dishes, such as cakes, cookies, and puddings. Cloves - Gingerbread, puddings, and marinades for meats. Curry - Vegetable dishes, fish and poultry dishes, and stir-fry dishes. Ginger - Vegetable dishes, fish dishes, and stir-fry dishes. Nutmeg - Pasta, vegetables, poultry, fish dishes, and custard. Summary Cooking with less salt is one way to reduce the amount of sodium that you get from food. Buy sodium-free or low-sodium products. Check the food label before using or buying packaged ingredients. Use herbs, seasonings without salt, and spices as substitutes for salt in foods. This information is not intended to replace advice given to you by your health care provider. Make sure you discuss any questions you have with your health care provider. Document Revised: 10/24/2019 Document Reviewed: 10/24/2019 Elsevier Patient Education  2022 [place] Eating Plan DASH stands for Dietary Approaches to Stop Hypertension. The DASH eating plan is a healthy eating plan that has been shown to: Reduce high blood pressure (hypertension). Reduce your risk for type 2 diabetes, heart disease, and stroke. Help with weight loss. What are tips for following this plan? Reading food labels Check food labels for the amount of salt (sodium) per serving. Choose foods with less than 5 percent of the Daily Value of sodium. Generally, foods with less than 300  milligrams (mg) of sodium per serving fit into this eating plan. To find whole grains, look for the word "whole" as the first word in the ingredient list. Shopping Buy products labeled as "low-sodium" or "no salt added." Buy fresh foods. Avoid canned foods and pre-made or frozen meals. Cooking Avoid adding salt when cooking. Use salt-free seasonings or herbs instead of table salt or sea salt. Check with your health care provider or pharmacist before using salt substitutes. Do not fry foods. Cook foods using healthy methods such as baking, boiling, grilling, roasting, and broiling instead. Cook with heart-healthy oils, such as olive, canola, avocado, soybean, or sunflower oil. Meal planning  Eat a balanced diet that includes: 4 or more servings of fruits and 4 or more servings of vegetables each day. Try to fill one-half of your plate with fruits and vegetables. 6-8 servings of whole grains each day. Less than 6 oz (170 g) of lean meat, poultry, or fish each day. A 3-oz (85-g) serving of meat is about the same size as a deck of cards. One egg equals 1 oz (28 g). 2-3 servings of low-fat dairy each day. One serving is 1 cup (237 mL). 1 serving of nuts, seeds, or beans 5 times each week. 2-3 servings of heart-healthy fats. Healthy fats called omega-3 fatty acids are found in foods such as walnuts, flaxseeds, fortified milks, and eggs. These fats are also found in cold-water fish, such as sardines, salmon, and mackerel. Limit how much you eat of: Canned or prepackaged foods. Food that is high in trans fat, such as some fried foods. Food that is high in saturated fat, such as fatty meat. Desserts and other sweets, sugary drinks, and other foods with added sugar. Full-fat dairy products. Do not salt foods before eating. Do not eat more than 4 egg yolks a week. Try to eat at least 2  vegetarian meals a week. Eat more home-cooked food and less restaurant, buffet, and fast food. Lifestyle When  eating at a restaurant, ask that your food be prepared with less salt or no salt, if possible. If you drink alcohol: Limit how much you use to: 0-1 drink a day for women who are not pregnant. 0-2 drinks a day for men. Be aware of how much alcohol is in your drink. In the [place] one drink equals one 12 oz bottle of beer (355 mL), one 5 oz glass of wine (148 mL), or one 1 oz glass of hard liquor (44 mL). General information Avoid eating more than 2,300 mg of salt a day. If you have hypertension, you may need to reduce your sodium intake to 1,500 mg a day. Work with your health care provider to maintain a healthy body weight or to lose weight. Ask what an ideal weight is for you. Get at least 30 minutes of exercise that causes your heart to beat faster (aerobic exercise) most days of the week. Activities may include walking, swimming, or biking. Work with your health care provider or dietitian to adjust your eating plan to your individual calorie needs. What foods should I eat? Fruits All fresh, dried, or frozen fruit. Canned fruit in natural juice (without added sugar). Vegetables Fresh or frozen vegetables (raw, steamed, roasted, or grilled). Low-sodium or reduced-sodium tomato and vegetable juice. Low-sodium or reduced-sodium tomato sauce and tomato paste. Low-sodium or reduced-sodium canned vegetables. Grains Whole-grain or whole-wheat bread. Whole-grain or whole-wheat pasta. Brown rice. [place]. Bulgur. Whole-grain and low-sodium cereals. Pita bread. Low-fat, low-sodium crackers. Whole-wheat flour tortillas. Meats and other proteins Skinless chicken or [place]. Ground chicken or [place]. Pork with fat trimmed off. Fish and seafood. Egg whites. Dried beans, peas, or lentils. Unsalted nuts, nut butters, and seeds. Unsalted canned beans. Lean cuts of beef with fat trimmed off. Low-sodium, lean precooked or cured meat, such as sausages or meat loaves. Dairy Low-fat (1%) or fat-free (skim)  milk. Reduced-fat, low-fat, or fat-free cheeses. Nonfat, low-sodium ricotta or cottage cheese. Low-fat or nonfat yogurt. Low-fat, low-sodium cheese. Fats and oils Soft margarine without trans fats. Vegetable oil. Reduced-fat, low-fat, or light mayonnaise and salad dressings (reduced-sodium). Canola, safflower, olive, avocado, soybean, and sunflower oils. Avocado. Seasonings and condiments Herbs. Spices. Seasoning mixes without salt. Other foods Unsalted popcorn and pretzels. Fat-free sweets. The items listed above may not be a complete list of foods and beverages you can eat. Contact a dietitian for more information. What foods should I avoid? Fruits Canned fruit in a light or heavy syrup. Fried fruit. Fruit in cream or butter sauce. Vegetables Creamed or fried vegetables. Vegetables in a cheese sauce. Regular canned vegetables (not low-sodium or reduced-sodium). Regular canned tomato sauce and paste (not low-sodium or reduced-sodium). Regular tomato and vegetable juice (not low-sodium or reduced-sodium). Angie Fava. Olives. Grains Baked goods made with fat, such as croissants, muffins, or some breads. Dry pasta or rice meal packs. Meats and other proteins Fatty cuts of meat. Ribs. Fried meat. Berniece Salines. Bologna, salami, and other precooked or cured meats, such as sausages or meat loaves. Fat from the back of a pig (fatback). Bratwurst. Salted nuts and seeds. Canned beans with added salt. Canned or smoked fish. Whole eggs or egg yolks. Chicken or [place] with skin. Dairy Whole or 2% milk, cream, and half-and-half. Whole or full-fat cream cheese. Whole-fat or sweetened yogurt. Full-fat cheese. Nondairy creamers. Whipped toppings. Processed cheese and cheese spreads. Fats and oils Butter. Stick margarine.  Lard. Shortening. Ghee. Bacon fat. Tropical oils, such as coconut, palm kernel, or palm oil. Seasonings and condiments Onion salt, garlic salt, seasoned salt, table salt, and sea salt. Worcestershire  sauce. Tartar sauce. Barbecue sauce. Teriyaki sauce. Soy sauce, including reduced-sodium. Steak sauce. Canned and packaged gravies. Fish sauce. Oyster sauce. Cocktail sauce. Store-bought horseradish. Ketchup. Mustard. Meat flavorings and tenderizers. Bouillon cubes. Hot sauces. Pre-made or packaged marinades. Pre-made or packaged taco seasonings. Relishes. Regular salad dressings. Other foods Salted popcorn and pretzels. The items listed above may not be a complete list of foods and beverages you should avoid. Contact a dietitian for more information. Where to find more information National Heart, Lung, and Blood Institute: https://wilson-eaton.com/ American Heart Association: www.heart.org Academy of Nutrition and Dietetics: www.eatright.[place]: www.kidney.org Summary The DASH eating plan is a healthy eating plan that has been shown to reduce high blood pressure (hypertension). It may also reduce your risk for type 2 diabetes, heart disease, and stroke. When on the DASH eating plan, aim to eat more fresh fruits and vegetables, whole grains, lean proteins, low-fat dairy, and heart-healthy fats. With the DASH eating plan, you should limit salt (sodium) intake to 2,300 mg a day. If you have hypertension, you may need to reduce your sodium intake to 1,500 mg a day. Work with your health care provider or dietitian to adjust your eating plan to your individual calorie needs. This information is not intended to replace advice given to you by your health care provider. Make sure you discuss any questions you have with your health care provider. Document Revised: 10/05/2019 Document Reviewed: 10/05/2019 Elsevier Patient Education  2022 [employer].

## 2022-01-25 ENCOUNTER — Ambulatory Visit: Payer: Medicare Other | Admitting: Nurse Practitioner

## 2022-02-01 ENCOUNTER — Ambulatory Visit (INDEPENDENT_AMBULATORY_CARE_PROVIDER_SITE_OTHER): Payer: Medicare Other | Admitting: Family Medicine

## 2022-02-01 ENCOUNTER — Other Ambulatory Visit: Payer: Self-pay

## 2022-02-01 ENCOUNTER — Encounter: Payer: Self-pay | Admitting: Family Medicine

## 2022-02-01 ENCOUNTER — Other Ambulatory Visit (HOSPITAL_COMMUNITY): Payer: Self-pay

## 2022-02-01 VITALS — BP 144/93 | HR 76 | Temp 98.1°F | Resp 16 | Ht 63.0 in | Wt 182.2 lb

## 2022-02-01 DIAGNOSIS — Z13220 Encounter for screening for lipoid disorders: Secondary | ICD-10-CM

## 2022-02-01 DIAGNOSIS — Z130 Encounter for screening for diseases of the blood and blood-forming organs and certain disorders involving the immune mechanism: Secondary | ICD-10-CM

## 2022-02-01 DIAGNOSIS — Z0001 Encounter for general adult medical examination with abnormal findings: Secondary | ICD-10-CM

## 2022-02-01 DIAGNOSIS — Z13228 Encounter for screening for other metabolic disorders: Secondary | ICD-10-CM

## 2022-02-01 DIAGNOSIS — Z0000 Encounter for general adult medical examination without abnormal findings: Secondary | ICD-10-CM

## 2022-02-01 DIAGNOSIS — I10 Essential (primary) hypertension: Secondary | ICD-10-CM | POA: Diagnosis not present

## 2022-02-01 MED ORDER — AMLODIPINE BESYLATE 5 MG PO TABS
5.0000 mg | ORAL_TABLET | Freq: Every day | ORAL | 1 refills | Status: DC
  Filled 2022-02-01: qty 90, 90d supply, fill #0

## 2022-02-01 MED ORDER — AMLODIPINE BESYLATE 10 MG PO TABS
10.0000 mg | ORAL_TABLET | Freq: Every day | ORAL | 0 refills | Status: DC
  Filled 2022-02-01: qty 90, 90d supply, fill #0

## 2022-02-01 MED ORDER — LOSARTAN POTASSIUM 100 MG PO TABS
100.0000 mg | ORAL_TABLET | Freq: Every day | ORAL | 1 refills | Status: DC
  Filled 2022-02-01: qty 90, 90d supply, fill #0

## 2022-02-01 MED ORDER — HYDROCHLOROTHIAZIDE 25 MG PO TABS
25.0000 mg | ORAL_TABLET | Freq: Every day | ORAL | 1 refills | Status: DC
  Filled 2022-02-01: qty 90, 90d supply, fill #0

## 2022-02-01 NOTE — Progress Notes (Signed)
P[patient is here for here CPE . Patient has no concerns for provider today ?

## 2022-02-01 NOTE — Progress Notes (Signed)
Established Patient Office Visit  Subjective:  Patient ID: Barbara Adkins, female    DOB: 1951-02-08  Age: 71 y.o. MRN: [id number]  CC:  Chief Complaint  Patient presents with   Annual Exam    HPI Barbara Adkins presents for routine annual exam.   Past Medical History:  Diagnosis Date   Allergy    Arthritis    Deviated septum    Diabetes mellitus without complication (HCC)    GERD (gastroesophageal reflux disease)    Hypertension     Past Surgical History:  Procedure Laterality Date   ETHMOIDECTOMY Right 05/17/2018   Procedure: RIGHT ETHMOIDECTOMY WITH TISSUE REMOVAL;  Surgeon: Newman Pies, MD;  Location: [facility];  Service: ENT;  Laterality: Right;   FRONTAL SINUS EXPLORATION Right 05/17/2018   Procedure: RIGHT FRONTAL RECESS EXPLORATION;  Surgeon: Newman Pies, MD;  Location: [facility];  Service: ENT;  Laterality: Right;   MAXILLARY ANTROSTOMY Right 05/17/2018   Procedure: RIGHT MAXILLARY ANTROSTOMY WITH TISSUE REMOVAL;  Surgeon: Newman Pies, MD;  Location: [facility];  Service: ENT;  Laterality: Right;   NASAL SEPTOPLASTY W/ TURBINOPLASTY Bilateral 05/17/2018   Procedure: NASAL SEPTOPLASTY WITH TURBINATE REDUCTION;  Surgeon: Newman Pies, MD;  Location: [facility];  Service: ENT;  Laterality: Bilateral;   SINUS ENDO WITH FUSION N/A 05/17/2018   Procedure: SINUS ENDO WITH FUSION;  Surgeon: Newman Pies, MD;  Location: [facility];  Service: ENT;  Laterality: N/A;   SPHENOIDECTOMY Right 05/17/2018   Procedure: RIGHT SPHENOIDECTOMY WITH TISSUE REMOVAL;  Surgeon: Newman Pies, MD;  Location: [facility];  Service: ENT;  Laterality: Right;   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   Procedure: TOTAL HIP ARTHROPLASTY;  Surgeon: Valeria Batman, MD;  Location: [facility];  Service: Orthopedics;  Laterality: Left;   TUBAL LIGATION      Family History  Problem Relation Age of Onset   Asthma Other    Cancer  Other    Hypertension Other    Diabetes Other    Colon cancer Maternal Grandmother    Breast cancer Neg Hx    Esophageal cancer Neg Hx    Rectal cancer Neg Hx    Stomach cancer Neg Hx     Social History   Socioeconomic History   Marital status: Widowed    Spouse name: Not on file   Number of children: Not on file   Years of education: Not on file   Highest education level: Not on file  Occupational History   Not on file  Tobacco Use   Smoking status: Never   Smokeless tobacco: Never  Vaping Use   Vaping Use: Never used  Substance and Sexual Activity   Alcohol use: No   Drug use: Never   Sexual activity: Not on file  Other Topics Concern   Not on file  Social History Narrative   Not on file   Social Determinants of Health   Financial Resource Strain: Not on file  Food Insecurity: Not on file  Transportation Needs: Not on file  Physical Activity: Not on file  Stress: Not on file  Social Connections: Not on file  Intimate Partner Violence: Not on file    ROS Review of Systems  All other systems reviewed and are negative.  Objective:   Today's Vitals: BP (!) 144/93   Pulse 76   Temp 98.1 F (36.7 C) (Oral)   Resp 16   Ht 5'  3" (1.6 m)   Wt 182 lb 3.2 oz (82.6 kg)   PF 97 L/min   BMI 32.28 kg/m   Physical Exam Vitals and nursing note reviewed.  Constitutional:      General: She is not in acute distress. HENT:     Head: Normocephalic and atraumatic.     Right Ear: Tympanic membrane, ear canal and external ear normal.     Left Ear: Tympanic membrane, ear canal and external ear normal.     Nose: Nose normal.     Mouth/Throat:     Mouth: Mucous membranes are moist.     Pharynx: Oropharynx is clear.  Eyes:     Conjunctiva/sclera: Conjunctivae normal.     Pupils: Pupils are equal, round, and reactive to light.  Neck:     Thyroid: No thyromegaly.  Cardiovascular:     Rate and Rhythm: Normal rate and regular rhythm.     Heart sounds: Normal heart  sounds. No murmur heard. Pulmonary:     Effort: Pulmonary effort is normal. No respiratory distress.     Breath sounds: Normal breath sounds.  Abdominal:     General: There is no distension.     Palpations: Abdomen is soft. There is no mass.     Tenderness: There is no abdominal tenderness.  Musculoskeletal:        General: Normal range of motion.     Cervical back: Normal range of motion and neck supple.  Skin:    General: Skin is warm and dry.  Neurological:     General: No focal deficit present.     Mental Status: She is alert and oriented to person, place, and time.  Psychiatric:        Mood and Affect: Mood normal.        Behavior: Behavior normal.    Assessment & Plan:   1. Annual physical exam Routine labs ordered - CMP14+EGFR  2. Essential hypertension Slightly elevated readings. Meds refilled.   3. Screening for deficiency anemia  - CBC with Differential  4. Screening for endocrine/metabolic/immunity disorders  - Hemoglobin A1c - TSH  5. Screening for lipid disorders  - Lipid Panel    Outpatient Encounter Medications as of 02/01/2022  Medication Sig   amLODipine (NORVASC) 10 MG tablet Take by mouth.   atorvastatin (LIPITOR) 20 MG tablet TAKE 1 TABLET BY MOUTH DAILY   budesonide-formoterol (SYMBICORT) 160-4.5 MCG/ACT inhaler Inhale 2 puffs into the lungs in the morning and at bedtime.   fluticasone (FLONASE) 50 MCG/ACT nasal spray USE 2 SPRAYS IN EACH NOSTRIL DAILY.   hydrochlorothiazide (HYDRODIURIL) 25 MG tablet Take 1 tablet (25 mg total) by mouth daily.   latanoprost (XALATAN) 0.005 % ophthalmic solution Place 1 drop into both eyes nightly.   meloxicam (MOBIC) 7.5 MG tablet Take 1 tablet (7.5 mg total) by mouth daily.   metFORMIN (GLUCOPHAGE) 500 MG tablet Take 500 mg by mouth daily with breakfast.   [DISCONTINUED] losartan (COZAAR) 100 MG tablet TAKE 1 TABLET BY MOUTH EVERY DAY   [DISCONTINUED] losartan (COZAAR) 50 MG tablet Take by mouth.    acetaminophen (TYLENOL) 325 MG tablet Take 2 tablets (650 mg total) by mouth every 6 (six) hours. (Patient not taking: Reported on 03/25/2020)   albuterol (VENTOLIN HFA) 108 (90 Base) MCG/ACT inhaler Inhale 2 puffs into the lungs every 4 (four) hours. (Patient not taking: Reported on 02/01/2022)   amLODipine (NORVASC) 5 MG tablet Take 1 tablet (5 mg total) by mouth daily.  Blood Pressure Monitoring (BLOOD PRESSURE KIT) DEVI 1 Device by Does not apply route 3 (three) times a week. Use to check blood pressure three times a week. ICD10 I10 (Patient not taking: Reported on 02/01/2022)   budesonide-formoterol (SYMBICORT) 160-4.5 MCG/ACT inhaler    cetirizine (ZYRTEC) 10 MG tablet Take 1 tablet (10 mg total) by mouth daily. (Patient not taking: Reported on 03/25/2020)   fluticasone (FLONASE) 50 MCG/ACT nasal spray Place into the nose.   influenza vaccine adjuvanted (FLUAD) 0.5 ML injection Inject 0.5 mLs into the muscle. (Patient not taking: Reported on 02/01/2022)   latanoprost (XALATAN) 0.005 % ophthalmic solution Place 1 drop into both eyes nightly. (Patient not taking: Reported on 02/01/2022)   losartan (COZAAR) 100 MG tablet TAKE 1 TABLET BY MOUTH EVERY DAY   METFORMIN & DIET MANAGE PROD PO    molnupiravir EUA (LAGEVRIO) 200 MG CAPS capsule 4 capsules   montelukast (SINGULAIR) 10 MG tablet Take 1 tablet (10 mg total) by mouth at bedtime. (Patient not taking: Reported on 02/01/2022)   omeprazole (PRILOSEC) 10 MG capsule    omeprazole (PRILOSEC) 20 MG capsule Take 1 capsule (20 mg total) by mouth daily. (Patient not taking: Reported on 02/01/2022)   Timolol-Dorzolamid-Latanoprost 0.5-0.15-0.005 % SOLN Apply to eye.   [DISCONTINUED] amLODipine (NORVASC) 5 MG tablet TAKE 1 TABLET BY MOUTH ONCE DAILY.   [DISCONTINUED] prednisoLONE acetate (PRED FORTE) 1 % ophthalmic suspension Place 1 drop into the left eye 4 times daily. Start using day after surgery as instructed (Patient not taking: Reported on 02/01/2022)    [DISCONTINUED] prednisoLONE sodium phosphate (INFLAMASE FORTE) 1 % ophthalmic solution Place 1 drop into the right eye 4 (four) times daily. To start after surgery (Patient not taking: Reported on 02/01/2022)   No facility-administered encounter medications on file as of 02/01/2022.    Follow-up: No follow-ups on file.   Tommie Raymond, MD

## 2022-02-02 ENCOUNTER — Encounter: Payer: Self-pay | Admitting: Family Medicine

## 2022-02-02 LAB — CMP14+EGFR
ALT: 10 IU/L (ref 0–32)
AST: 23 IU/L (ref 0–40)
Albumin/Globulin Ratio: 1.4 (ref 1.2–2.2)
Albumin: 4.3 g/dL (ref 3.8–4.8)
Alkaline Phosphatase: 82 IU/L (ref 44–121)
BUN/Creatinine Ratio: 9 — ABNORMAL LOW (ref 12–28)
BUN: 12 mg/dL (ref 8–27)
Bilirubin Total: 0.4 mg/dL (ref 0.0–1.2)
CO2: 27 mmol/L (ref 20–29)
Calcium: 9.6 mg/dL (ref 8.7–10.3)
Chloride: 106 mmol/L (ref 96–106)
Creatinine, Ser: 1.35 mg/dL — ABNORMAL HIGH (ref 0.57–1.00)
Globulin, Total: 3.1 g/dL (ref 1.5–4.5)
Glucose: 91 mg/dL (ref 70–99)
Potassium: 4.3 mmol/L (ref 3.5–5.2)
Sodium: 144 mmol/L (ref 134–144)
Total Protein: 7.4 g/dL (ref 6.0–8.5)
eGFR: 42 mL/min/{1.73_m2} — ABNORMAL LOW (ref >59)

## 2022-02-02 LAB — CBC WITH DIFFERENTIAL/PLATELET
Basophils Absolute: 0.1 10*3/uL (ref 0.0–0.2)
Basos: 1 %
EOS (ABSOLUTE): 0.4 10*3/uL (ref 0.0–0.4)
Eos: 5 %
Hematocrit: 39.3 % (ref 34.0–46.6)
Hemoglobin: 11.7 g/dL (ref 11.1–15.9)
Immature Grans (Abs): 0 10*3/uL (ref 0.0–0.1)
Immature Granulocytes: 0 %
Lymphocytes Absolute: 1.9 10*3/uL (ref 0.7–3.1)
Lymphs: 27 %
MCH: 21.1 pg — ABNORMAL LOW (ref 26.6–33.0)
MCHC: 29.8 g/dL — ABNORMAL LOW (ref 31.5–35.7)
MCV: 71 fL — ABNORMAL LOW (ref 79–97)
Monocytes Absolute: 0.5 10*3/uL (ref 0.1–0.9)
Monocytes: 7 %
Neutrophils Absolute: 4.2 10*3/uL (ref 1.4–7.0)
Neutrophils: 60 %
Platelets: 376 10*3/uL (ref 150–450)
RBC: 5.55 x10E6/uL — ABNORMAL HIGH (ref 3.77–5.28)
RDW: 14.5 % (ref 11.7–15.4)
WBC: 7.1 10*3/uL (ref 3.4–10.8)

## 2022-02-02 LAB — TSH: TSH: 1.11 u[IU]/mL (ref 0.450–4.500)

## 2022-02-02 LAB — LIPID PANEL
Chol/HDL Ratio: 2.8 ratio (ref 0.0–4.4)
Cholesterol, Total: 212 mg/dL — ABNORMAL HIGH (ref 100–199)
HDL: 77 mg/dL (ref >39)
LDL Chol Calc (NIH): 117 mg/dL — ABNORMAL HIGH (ref 0–99)
Triglycerides: 105 mg/dL (ref 0–149)
VLDL Cholesterol Cal: 18 mg/dL (ref 5–40)

## 2022-02-02 LAB — HEMOGLOBIN A1C
Est. average glucose Bld gHb Est-mCnc: 123 mg/dL
Hgb A1c MFr Bld: 5.9 % — ABNORMAL HIGH (ref 4.8–5.6)

## 2022-02-03 ENCOUNTER — Other Ambulatory Visit (HOSPITAL_COMMUNITY): Payer: Self-pay

## 2022-02-08 ENCOUNTER — Ambulatory Visit: Payer: Medicare Other | Admitting: Family Medicine

## 2022-02-12 ENCOUNTER — Other Ambulatory Visit: Payer: Self-pay | Admitting: Family Medicine

## 2022-02-12 ENCOUNTER — Other Ambulatory Visit (HOSPITAL_COMMUNITY): Payer: Self-pay

## 2022-02-12 MED ORDER — OMEPRAZOLE 20 MG PO CPDR
20.0000 mg | DELAYED_RELEASE_CAPSULE | Freq: Every day | ORAL | 0 refills | Status: DC
  Filled 2022-02-12: qty 30, 30d supply, fill #0

## 2022-03-09 ENCOUNTER — Ambulatory Visit (INDEPENDENT_AMBULATORY_CARE_PROVIDER_SITE_OTHER): Payer: Medicare Other | Admitting: Family Medicine

## 2022-03-09 ENCOUNTER — Encounter: Payer: Self-pay | Admitting: Family Medicine

## 2022-03-09 VITALS — BP 124/83 | HR 70 | Temp 98.1°F | Resp 16 | Wt 182.8 lb

## 2022-03-09 DIAGNOSIS — K219 Gastro-esophageal reflux disease without esophagitis: Secondary | ICD-10-CM | POA: Diagnosis not present

## 2022-03-09 DIAGNOSIS — I10 Essential (primary) hypertension: Secondary | ICD-10-CM

## 2022-03-09 DIAGNOSIS — J4520 Mild intermittent asthma, uncomplicated: Secondary | ICD-10-CM

## 2022-03-09 DIAGNOSIS — E785 Hyperlipidemia, unspecified: Secondary | ICD-10-CM

## 2022-03-09 DIAGNOSIS — E119 Type 2 diabetes mellitus without complications: Secondary | ICD-10-CM | POA: Diagnosis not present

## 2022-03-09 MED ORDER — ATORVASTATIN CALCIUM 20 MG PO TABS: 20.0000 mg | ORAL_TABLET | Freq: Every day | ORAL | 1 refills | Status: DC

## 2022-03-09 MED ORDER — FLUTICASONE PROPIONATE 50 MCG/ACT NA SUSP: 2.0000 | Freq: Every day | NASAL | 1 refills | Status: DC

## 2022-03-09 MED ORDER — FLUTICASONE PROPIONATE 50 MCG/ACT NA SUSP: 2.0000 | Freq: Every day | NASAL | 3 refills | Status: DC

## 2022-03-09 MED ORDER — AMLODIPINE BESYLATE 10 MG PO TABS: 10.0000 mg | ORAL_TABLET | Freq: Every day | ORAL | 0 refills | Status: DC

## 2022-03-09 MED ORDER — MONTELUKAST SODIUM 10 MG PO TABS: 10.0000 mg | ORAL_TABLET | Freq: Every day | ORAL | 1 refills | Status: DC

## 2022-03-09 MED ORDER — OMEPRAZOLE 10 MG PO CPDR: 20.0000 mg | DELAYED_RELEASE_CAPSULE | Freq: Every day | ORAL | 1 refills | Status: DC

## 2022-03-09 MED ORDER — METFORMIN HCL 500 MG PO TABS: 500.0000 mg | ORAL_TABLET | Freq: Every day | ORAL | 1 refills | Status: DC

## 2022-03-09 MED ORDER — HYDROCHLOROTHIAZIDE 25 MG PO TABS: 25.0000 mg | ORAL_TABLET | Freq: Every day | ORAL | 1 refills | Status: DC

## 2022-03-09 MED ORDER — LOSARTAN POTASSIUM 100 MG PO TABS: 100.0000 mg | ORAL_TABLET | Freq: Every day | ORAL | 1 refills | Status: DC

## 2022-03-09 NOTE — Progress Notes (Signed)
Patient has no concern that she need to talk with provider about today. Patient said she was doing very well. ?

## 2022-03-11 NOTE — Progress Notes (Signed)
? ?Established Patient Office Visit ? ?Subjective   ? ?Patient ID: Barbara Adkins, female    DOB: [birth year]  Age: 71 y.o. MRN: [id number] ? ?CC:  ?Chief Complaint  ?Patient presents with  ? Follow-up  ? Hypertension  ? ? ?HPI ?Barbara Adkins presents for routine follow up of chronic med issues with med refills. Patient denies acute complaints or concerns.  ? ? ?Outpatient Encounter Medications as of 03/09/2022  ?Medication Sig  ? budesonide-formoterol (SYMBICORT) 160-4.5 MCG/ACT inhaler Inhale 2 puffs into the lungs in the morning and at bedtime.  ? budesonide-formoterol (SYMBICORT) 160-4.5 MCG/ACT inhaler   ? cetirizine (ZYRTEC) 10 MG tablet Take 1 tablet (10 mg total) by mouth daily.  ? latanoprost (XALATAN) 0.005 % ophthalmic solution Place 1 drop into both eyes nightly.  ? meloxicam (MOBIC) 7.5 MG tablet Take 1 tablet (7.5 mg total) by mouth daily.  ? METFORMIN & DIET MANAGE PROD PO   ? molnupiravir EUA (LAGEVRIO) 200 MG CAPS capsule 4 capsules  ? omeprazole (PRILOSEC) 20 MG capsule Take 1 capsule (20 mg total) by mouth daily.  ? Timolol-Dorzolamid-Latanoprost 0.5-0.15-0.005 % SOLN Apply to eye.  ? [DISCONTINUED] amLODipine (NORVASC) 10 MG tablet Take 1 tablet (10 mg total) by mouth daily.  ? [DISCONTINUED] atorvastatin (LIPITOR) 20 MG tablet TAKE 1 TABLET BY MOUTH DAILY  ? [DISCONTINUED] fluticasone (FLONASE) 50 MCG/ACT nasal spray USE 2 SPRAYS IN EACH NOSTRIL DAILY.  ? [DISCONTINUED] fluticasone (FLONASE) 50 MCG/ACT nasal spray Place into the nose.  ? [DISCONTINUED] hydrochlorothiazide (HYDRODIURIL) 25 MG tablet Take 1 tablet (25 mg total) by mouth daily.  ? [DISCONTINUED] losartan (COZAAR) 100 MG tablet TAKE 1 TABLET BY MOUTH EVERY DAY  ? [DISCONTINUED] metFORMIN (GLUCOPHAGE) 500 MG tablet Take 500 mg by mouth daily with breakfast.  ? [DISCONTINUED] omeprazole (PRILOSEC) 10 MG capsule   ? acetaminophen (TYLENOL) 325 MG tablet Take 2 tablets (650 mg total) by mouth every 6 (six) hours. (Patient not  taking: Reported on 03/25/2020)  ? albuterol (VENTOLIN HFA) 108 (90 Base) MCG/ACT inhaler Inhale 2 puffs into the lungs every 4 (four) hours. (Patient not taking: Reported on 02/01/2022)  ? amLODipine (NORVASC) 10 MG tablet Take 1 tablet (10 mg total) by mouth daily.  ? atorvastatin (LIPITOR) 20 MG tablet TAKE 1 TABLET BY MOUTH DAILY  ? Blood Pressure Monitoring (BLOOD PRESSURE KIT) DEVI 1 Device by Does not apply route 3 (three) times a week. Use to check blood pressure three times a week. ICD10 I10 (Patient not taking: Reported on 02/01/2022)  ? fluticasone (FLONASE) 50 MCG/ACT nasal spray USE 2 SPRAYS IN EACH NOSTRIL DAILY.  ? fluticasone (FLONASE) 50 MCG/ACT nasal spray Place 2 sprays into both nostrils daily.  ? hydrochlorothiazide (HYDRODIURIL) 25 MG tablet Take 1 tablet (25 mg total) by mouth daily.  ? influenza vaccine adjuvanted (FLUAD) 0.5 ML injection Inject 0.5 mLs into the muscle. (Patient not taking: Reported on 02/01/2022)  ? latanoprost (XALATAN) 0.005 % ophthalmic solution Place 1 drop into both eyes nightly. (Patient not taking: Reported on 02/01/2022)  ? losartan (COZAAR) 100 MG tablet TAKE 1 TABLET BY MOUTH EVERY DAY  ? metFORMIN (GLUCOPHAGE) 500 MG tablet Take 1 tablet (500 mg total) by mouth daily with breakfast.  ? montelukast (SINGULAIR) 10 MG tablet Take 1 tablet (10 mg total) by mouth at bedtime.  ? omeprazole (PRILOSEC) 10 MG capsule Take 2 capsules (20 mg total) by mouth daily.  ? [DISCONTINUED] montelukast (SINGULAIR) 10 MG tablet Take 1 tablet (10 mg  total) by mouth at bedtime. (Patient not taking: Reported on 02/01/2022)  ? ?No facility-administered encounter medications on file as of 03/09/2022.  ? ? ?Past Medical History:  ?Diagnosis Date  ? Allergy   ? Arthritis   ? Deviated septum   ? Diabetes mellitus without complication ([place])   ? GERD (gastroesophageal reflux disease)   ? Hypertension   ? ? ?Past Surgical History:  ?Procedure Laterality Date  ? ETHMOIDECTOMY Right 05/17/2018  ?  Procedure: RIGHT ETHMOIDECTOMY WITH TISSUE REMOVAL;  Surgeon: Leta Baptist, MD;  Location: [facility];  Service: ENT;  Laterality: Right;  ? FRONTAL SINUS EXPLORATION Right 05/17/2018  ? Procedure: RIGHT FRONTAL RECESS EXPLORATION;  Surgeon: Leta Baptist, MD;  Location: [facility];  Service: ENT;  Laterality: Right;  ? MAXILLARY ANTROSTOMY Right 05/17/2018  ? Procedure: RIGHT MAXILLARY ANTROSTOMY WITH TISSUE REMOVAL;  Surgeon: Leta Baptist, MD;  Location: [facility];  Service: ENT;  Laterality: Right;  ? NASAL SEPTOPLASTY W/ TURBINOPLASTY Bilateral 05/17/2018  ? Procedure: NASAL SEPTOPLASTY WITH TURBINATE REDUCTION;  Surgeon: Leta Baptist, MD;  Location: [facility];  Service: ENT;  Laterality: Bilateral;  ? SINUS ENDO WITH FUSION N/A 05/17/2018  ? Procedure: SINUS ENDO WITH FUSION;  Surgeon: Leta Baptist, MD;  Location: [facility];  Service: ENT;  Laterality: N/A;  ? SPHENOIDECTOMY Right 05/17/2018  ? Procedure: RIGHT SPHENOIDECTOMY WITH TISSUE REMOVAL;  Surgeon: Leta Baptist, MD;  Location: [facility];  Service: ENT;  Laterality: Right;  ? 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  ? Procedure: TOTAL HIP ARTHROPLASTY;  Surgeon: Garald Balding, MD;  Location: [facility];  Service: Orthopedics;  Laterality: Left;  ? TUBAL LIGATION    ? ? ?Family History  ?Problem Relation Age of Onset  ? Asthma Other   ? Cancer Other   ? Hypertension Other   ? Diabetes Other   ? Colon cancer Maternal Grandmother   ? Breast cancer Neg Hx   ? Esophageal cancer Neg Hx   ? Rectal cancer Neg Hx   ? Stomach cancer Neg Hx   ? ? ?Social History  ? ?Socioeconomic History  ? Marital status: Widowed  ?  Spouse name: Not on file  ? Number of children: Not on file  ? Years of education: Not on file  ? Highest education level: Not on file  ?Occupational History  ? Not on file  ?Tobacco Use  ? Smoking status: Never  ? Smokeless tobacco: Never  ?Vaping Use  ? Vaping Use: Never used  ?Substance and  Sexual Activity  ? Alcohol use: No  ? Drug use: Never  ? Sexual activity: Not on file  ?Other Topics Concern  ? Not on file  ?Social History Narrative  ? Not on file  ? ?Social Determinants of Health  ? ?Financial Resource Strain: Not on file  ?Food Insecurity: Not on file  ?Transportation Needs: Not on file  ?Physical Activity: Not on file  ?Stress: Not on file  ?Social Connections: Not on file  ?Intimate Partner Violence: Not on file  ? ? ?Review of Systems  ?All other systems reviewed and are negative. ? ?  ? ? ?Objective   ? ?BP 124/83   Pulse 70   Temp 98.1 ?F (36.7 ?C) (Oral)   Resp 16   Wt 182 lb 12.8 oz (82.9 kg)   SpO2 98%   BMI 32.38 kg/m?  ? ?Physical Exam ?Vitals and nursing note reviewed.  ?Constitutional:   ?  General: She is not in acute distress. ?   Appearance: She is well-developed.  ?Cardiovascular:  ?   Rate and Rhythm: Normal rate and regular rhythm.  ?Pulmonary:  ?   Effort: Pulmonary effort is normal.  ?   Breath sounds: Normal breath sounds.  ?Abdominal:  ?   Palpations: Abdomen is soft.  ?   Tenderness: There is no abdominal tenderness.  ?Musculoskeletal:  ?   Right lower leg: No edema.  ?   Left lower leg: No edema.  ?Neurological:  ?   General: No focal deficit present.  ?   Mental Status: She is alert and oriented to person, place, and time.  ? ? ? ?  ? ?Assessment & Plan:  ? ?1. Essential hypertension ?Appears stable. Meds refilled. Continue and monitor ? ?2. Gastroesophageal reflux disease without esophagitis ?Continue. Meds refilled.  ? ?3. Mild intermittent asthma without complication ?Continue. Meds refilled.  ? ?4. Type 2 diabetes mellitus without complication, without long-term current use of insulin ([place]) ?Appears stable. Continue meds refilled.  ? ?5. Hyperlipidemia, unspecified hyperlipidemia type ?Continue. Meds refilled ? ?Return in about 4 months (around 07/09/2022) for follow up.  ? ?Becky Sax, MD ? ? ?

## 2022-03-19 ENCOUNTER — Other Ambulatory Visit (HOSPITAL_COMMUNITY): Payer: Self-pay

## 2022-04-22 ENCOUNTER — Other Ambulatory Visit: Payer: Self-pay | Admitting: Family Medicine

## 2022-04-22 DIAGNOSIS — H2512 Age-related nuclear cataract, left eye: Secondary | ICD-10-CM | POA: Diagnosis not present

## 2022-04-22 DIAGNOSIS — H401121 Primary open-angle glaucoma, left eye, mild stage: Secondary | ICD-10-CM | POA: Diagnosis not present

## 2022-04-22 DIAGNOSIS — H25012 Cortical age-related cataract, left eye: Secondary | ICD-10-CM | POA: Diagnosis not present

## 2022-04-22 DIAGNOSIS — Z961 Presence of intraocular lens: Secondary | ICD-10-CM | POA: Diagnosis not present

## 2022-04-22 NOTE — Telephone Encounter (Signed)
Medication Refill - Medication: budesonide-formoterol (SYMBICORT) 160-4.5 MCG/ACT inhaler   Has the patient contacted their pharmacy? Yes.   (Agent: If no, request that the patient contact the pharmacy for the refill. If patient does not wish to contact the pharmacy document the reason why and proceed with request.) (Agent: If yes, when and what did the pharmacy advise?) no refills   Preferred Pharmacy (with phone number or street name): [facility] ) - [place], [place]  [address] [place], [place] [address]  Phone:  [phone]  Fax:  [phone]  Has the patient been seen for an appointment in the last year OR does the patient have an upcoming appointment? Yes.    Agent: Please be advised that RX refills may take up to 3 business days. We ask that you follow-up with your pharmacy.

## 2022-04-22 NOTE — Telephone Encounter (Signed)
Requested medication (s) are due for refill today:   Yes  Requested medication (s) are on the active medication list:   Yes  Future visit scheduled:   Yes   Last ordered: 01/14/2022 10.2 g, 0 refills  Returned because prescribed by another provider.     Requested Prescriptions  Pending Prescriptions Disp Refills   budesonide-formoterol (SYMBICORT) 160-4.5 MCG/ACT inhaler 10.2 g 0    Sig: Inhale 2 puffs into the lungs in the morning and at bedtime.     Pulmonology:  Combination Products Passed - 04/22/2022 11:38 AM      Passed - Valid encounter within last 12 months    Recent Outpatient Visits           1 month ago Essential hypertension   [facility] at [facility], MD   2 months ago Annual physical exam   [facility] at [facility], MD   3 months ago Essential hypertension   [facility] Care at [facility], Kriste Basque, NP   2 years ago Essential hypertension   [facility] at [facility], Arlie Solomons, MD   2 years ago Encounter for Medicare annual wellness exam   Primary Care at [facility], MD       Future Appointments             In 2 months Dorna Mai, MD [facility] at [facility]

## 2022-05-01 ENCOUNTER — Other Ambulatory Visit: Payer: Self-pay | Admitting: Family Medicine

## 2022-05-03 NOTE — Telephone Encounter (Signed)
Requested Prescriptions  Pending Prescriptions Disp Refills  . metFORMIN (GLUCOPHAGE) 500 MG tablet [Pharmacy Med Name: metFORMIN HCl 500 MG Oral Tablet] 100 tablet 0    Sig: TAKE 1 TABLET BY MOUTH DAILY  WITH BREAKFAST     Endocrinology:  Diabetes - Biguanides Failed - 05/01/2022 10:10 AM      Failed - Cr in normal range and within 360 days    Creatinine, Ser  Date Value Ref Range Status  02/01/2022 1.35 (H) 0.57 - 1.00 mg/dL Final         Failed - eGFR in normal range and within 360 days    GFR calc Af Amer  Date Value Ref Range Status  09/25/2019 62 >59 mL/min/1.73 Final   GFR calc non Af Amer  Date Value Ref Range Status  09/25/2019 53 (L) >59 mL/min/1.73 Final   eGFR  Date Value Ref Range Status  02/01/2022 42 (L) >59 mL/min/1.73 Final         Failed - B12 Level in normal range and within 720 days    No results found for: "VITAMINB12"       Passed - HBA1C is between 0 and 7.9 and within 180 days    Hgb A1c MFr Bld  Date Value Ref Range Status  02/01/2022 5.9 (H) 4.8 - 5.6 % Final    Comment:             Prediabetes: 5.7 - 6.4          Diabetes: >6.4          Glycemic control for adults with diabetes: <7.0          Passed - Valid encounter within last 6 months    Recent Outpatient Visits          1 month ago Essential hypertension   Primary Care at [facility], MD   3 months ago Annual physical exam   Primary Care at [facility], MD   3 months ago Essential hypertension   Primary Care at [facility], Kriste Basque, NP   2 years ago Essential hypertension   Primary Care at [facility] [place], MD   2 years ago Encounter for Medicare annual wellness exam   Primary Care at [facility], Arlie Solomons, MD      Future Appointments            In 1 month Dorna Mai, MD Primary Care at [facility] - CBC within normal limits and completed in the last 12 months    WBC  Date Value Ref Range  Status  02/01/2022 7.1 3.4 - 10.8 x10E3/uL Final  11/30/2018 11.0 (H) 4.0 - 10.5 K/uL Final   RBC  Date Value Ref Range Status  02/01/2022 5.55 (H) 3.77 - 5.28 x10E6/uL Final  11/30/2018 4.85 3.87 - 5.11 MIL/uL Final   Hemoglobin  Date Value Ref Range Status  02/01/2022 11.7 11.1 - 15.9 g/dL Final   Hematocrit  Date Value Ref Range Status  02/01/2022 39.3 34.0 - 46.6 % Final   MCHC  Date Value Ref Range Status  02/01/2022 29.8 (L) 31.5 - 35.7 g/dL Final  11/30/2018 28.7 (L) 30.0 - 36.0 g/dL Final   [facility]  Date Value Ref Range Status  02/01/2022 21.1 (L) 26.6 - 33.0 pg Final  11/30/2018 20.0 (L) 26.0 - 34.0 pg Final   MCV  Date Value Ref Range Status  02/01/2022 71 (L) 79 - 97 fL Final   No results found for: "PLTCOUNTKUC", "LABPLAT", "POCPLA" RDW  Date Value Ref Range Status  02/01/2022 14.5 11.7 - 15.4 % Final         . atorvastatin (LIPITOR) 20 MG tablet [Pharmacy Med Name: Atorvastatin Calcium 20 MG Oral Tablet] 100 tablet 2    Sig: TAKE 1 TABLET BY MOUTH ONCE  DAILY     Cardiovascular:  Antilipid - Statins Failed - 05/01/2022 10:10 AM      Failed - Lipid Panel in normal range within the last 12 months    Cholesterol, Total  Date Value Ref Range Status  02/01/2022 212 (H) 100 - 199 mg/dL Final   LDL Chol Calc (NIH)  Date Value Ref Range Status  02/01/2022 117 (H) 0 - 99 mg/dL Final   HDL  Date Value Ref Range Status  02/01/2022 77 >39 mg/dL Final   Triglycerides  Date Value Ref Range Status  02/01/2022 105 0 - 149 mg/dL Final         Passed - Patient is not pregnant      Passed - Valid encounter within last 12 months    Recent Outpatient Visits          1 month ago Essential hypertension   Primary Care at [facility], MD   3 months ago Annual physical exam   Primary Care at [facility], MD   3 months ago Essential hypertension   Primary Care at [facility], Kriste Basque, NP   2 years ago Essential  hypertension   Primary Care at [facility], Arlie Solomons, MD   2 years ago Encounter for Medicare annual wellness exam   Primary Care at [facility], Arlie Solomons, MD      Future Appointments            In 1 month Dorna Mai, MD Primary Care at [facility]           . losartan (COZAAR) 100 MG tablet [Pharmacy Med Name: Losartan Potassium 100 MG Oral Tablet] 100 tablet 0    Sig: TAKE 1 TABLET BY MOUTH EVERY DAY     Cardiovascular:  Angiotensin Receptor Blockers Failed - 05/01/2022 10:10 AM      Failed - Cr in normal range and within 180 days    Creatinine, Ser  Date Value Ref Range Status  02/01/2022 1.35 (H) 0.57 - 1.00 mg/dL Final         Passed - K in normal range and within 180 days    Potassium  Date Value Ref Range Status  02/01/2022 4.3 3.5 - 5.2 mmol/L Final         Passed - Patient is not pregnant      Passed - Last BP in normal range    BP Readings from Last 1 Encounters:  03/09/22 124/83         Passed - Valid encounter within last 6 months    Recent Outpatient Visits          1 month ago Essential hypertension   Primary Care at [facility], MD   3 months ago Annual physical exam   Primary Care at [facility], MD   3 months ago Essential hypertension   Primary Care at [facility], Kriste Basque, NP   2 years ago Essential hypertension   Primary Care at [facility], Arlie Solomons, MD   2 years  ago Encounter for [employer] annual wellness exam   Primary Care at [facility], Arlie Solomons, MD      Future Appointments            In 1 month Dorna Mai, MD Primary Care at [facility]           . montelukast (SINGULAIR) 10 MG tablet [Pharmacy Med Name: Montelukast Sodium 10 MG Oral Tablet] 100 tablet 2    Sig: TAKE 1 TABLET BY MOUTH AT  BEDTIME     Pulmonology:  Leukotriene Inhibitors Passed - 05/01/2022 10:10 AM      Passed - Valid encounter within last 12 months    Recent Outpatient Visits          1  month ago Essential hypertension   Primary Care at [facility], MD   3 months ago Annual physical exam   Primary Care at [facility], MD   3 months ago Essential hypertension   Primary Care at [facility], Kriste Basque, NP   2 years ago Essential hypertension   Primary Care at [facility], Arlie Solomons, MD   2 years ago Encounter for Medicare annual wellness exam   Primary Care at [facility], Arlie Solomons, MD      Future Appointments            In 1 month Dorna Mai, MD Primary Care at [facility]

## 2022-05-03 NOTE — Telephone Encounter (Signed)
Pt requested refill for budesonide-formoterol (SYMBICORT) 160-4.5 MCG/ACT inhaler on 6.8.23 and has not been responded too/ the pharmacy also sent request with no response / please advise asap about refill to go to [facility]

## 2022-05-04 ENCOUNTER — Other Ambulatory Visit: Payer: Self-pay | Admitting: *Deleted

## 2022-05-05 ENCOUNTER — Other Ambulatory Visit: Payer: Self-pay | Admitting: Family Medicine

## 2022-05-05 ENCOUNTER — Other Ambulatory Visit (HOSPITAL_COMMUNITY): Payer: Self-pay

## 2022-05-05 MED ORDER — BUDESONIDE-FORMOTEROL FUMARATE 160-4.5 MCG/ACT IN AERO
2.0000 | INHALATION_SPRAY | Freq: Two times a day (BID) | RESPIRATORY_TRACT | 1 refills | Status: DC
  Filled 2022-05-05: qty 10.2, 30d supply, fill #0
  Filled 2022-06-29: qty 10.2, 30d supply, fill #1

## 2022-05-05 NOTE — Telephone Encounter (Signed)
Requested Prescriptions  Pending Prescriptions Disp Refills  . budesonide-formoterol (SYMBICORT) 160-4.5 MCG/ACT inhaler 10.2 g 0    Sig: Inhale 2 puffs into the lungs in the morning and at bedtime.     Pulmonology:  Combination Products Passed - 05/05/2022 11:24 AM      Passed - Valid encounter within last 12 months    Recent Outpatient Visits          1 month ago Essential hypertension   Primary Care at [facility], MD   3 months ago Annual physical exam   Primary Care at [facility], MD   3 months ago Essential hypertension   Primary Care at [facility], Kriste Basque, NP   2 years ago Essential hypertension   Primary Care at [facility], Arlie Solomons, MD   2 years ago Encounter for Medicare annual wellness exam   Primary Care at [facility], Arlie Solomons, MD      Future Appointments            In 1 month Dorna Mai, MD Primary Care at [facility]

## 2022-05-05 NOTE — Telephone Encounter (Signed)
Medication Refill - Medication: budesonide-formoterol (SYMBICORT) 160-4.5 MCG/ACT inhaler  Has the patient contacted their pharmacy? No.   Preferred Pharmacy (with phone number or street name):  [place] Phone:  [phone]  Fax:  [phone]      Has the patient been seen for an appointment in the last year OR does the patient have an upcoming appointment? Yes.    Agent: Please be advised that RX refills may take up to 3 business days. We ask that you follow-up with your pharmacy.

## 2022-05-14 ENCOUNTER — Other Ambulatory Visit: Payer: Self-pay | Admitting: Family Medicine

## 2022-05-14 NOTE — Telephone Encounter (Signed)
Requested Prescriptions  Pending Prescriptions Disp Refills  . omeprazole (PRILOSEC) 10 MG capsule [Pharmacy Med Name: Omeprazole 10 MG Oral Capsule Delayed Release] 180 capsule 2    Sig: TAKE 2 CAPSULES BY MOUTH DAILY     Gastroenterology: Proton Pump Inhibitors Passed - 05/14/2022  1:25 PM      Passed - Valid encounter within last 12 months    Recent Outpatient Visits          2 months ago Essential hypertension   Primary Care at [facility], MD   3 months ago Annual physical exam   Primary Care at [facility], MD   3 months ago Essential hypertension   Primary Care at [facility], Kriste Basque, NP   2 years ago Essential hypertension   Primary Care at [facility], Arlie Solomons, MD   2 years ago Encounter for Medicare annual wellness exam   Primary Care at [facility], Arlie Solomons, MD      Future Appointments            In 1 month Dorna Mai, MD Primary Care at [facility]

## 2022-05-20 ENCOUNTER — Other Ambulatory Visit (HOSPITAL_COMMUNITY): Payer: Self-pay

## 2022-05-20 DIAGNOSIS — H43813 Vitreous degeneration, bilateral: Secondary | ICD-10-CM | POA: Diagnosis not present

## 2022-05-20 DIAGNOSIS — Z961 Presence of intraocular lens: Secondary | ICD-10-CM | POA: Diagnosis not present

## 2022-05-20 DIAGNOSIS — H2512 Age-related nuclear cataract, left eye: Secondary | ICD-10-CM | POA: Diagnosis not present

## 2022-05-20 DIAGNOSIS — H25012 Cortical age-related cataract, left eye: Secondary | ICD-10-CM | POA: Diagnosis not present

## 2022-05-20 DIAGNOSIS — H401131 Primary open-angle glaucoma, bilateral, mild stage: Secondary | ICD-10-CM | POA: Diagnosis not present

## 2022-05-20 DIAGNOSIS — H33322 Round hole, left eye: Secondary | ICD-10-CM | POA: Diagnosis not present

## 2022-05-20 MED ORDER — KETOROLAC TROMETHAMINE 0.5 % OP SOLN
1.0000 [drp] | Freq: Four times a day (QID) | OPHTHALMIC | 0 refills | Status: AC
  Filled 2022-05-20: qty 15, 75d supply, fill #0

## 2022-05-20 MED ORDER — PREDNISOLONE ACETATE 1 % OP SUSP
1.0000 [drp] | Freq: Four times a day (QID) | OPHTHALMIC | 0 refills | Status: AC
  Filled 2022-05-20: qty 15, 75d supply, fill #0

## 2022-05-25 ENCOUNTER — Other Ambulatory Visit (HOSPITAL_COMMUNITY): Payer: Self-pay

## 2022-05-26 ENCOUNTER — Other Ambulatory Visit (HOSPITAL_COMMUNITY): Payer: Self-pay

## 2022-06-01 ENCOUNTER — Other Ambulatory Visit: Payer: Self-pay | Admitting: Family Medicine

## 2022-06-02 NOTE — Telephone Encounter (Signed)
Medication Refill - Medication:  meloxicam (MOBIC) 7.5 MG tablet   Has the patient contacted their pharmacy? Yes.   (Agent: If no, request that the patient contact the pharmacy for the refill. If patient does not wish to contact the pharmacy document the reason why and proceed with request.) (Agent: If yes, when and what did the pharmacy advise?)request was sent but only for Hydrochlorothiazide   Preferred Pharmacy (with phone number or street name): [facility]  Has the patient been seen for an appointment in the last year OR does the patient have an upcoming appointment? Yes.    Agent: Please be advised that RX refills may take up to 3 business days. We ask that you follow-up with your pharmacy.

## 2022-06-02 NOTE — Telephone Encounter (Signed)
Requested Prescriptions  Pending Prescriptions Disp Refills  . hydrochlorothiazide (HYDRODIURIL) 25 MG tablet [Pharmacy Med Name: hydroCHLOROthiazide 25 MG Oral Tablet] 100 tablet 2    Sig: TAKE 1 TABLET BY MOUTH DAILY     Cardiovascular: Diuretics - Thiazide Failed - 06/02/2022 11:40 AM      Failed - Cr in normal range and within 180 days    Creatinine, Ser  Date Value Ref Range Status  02/01/2022 1.35 (H) 0.57 - 1.00 mg/dL Final         Passed - K in normal range and within 180 days    Potassium  Date Value Ref Range Status  02/01/2022 4.3 3.5 - 5.2 mmol/L Final         Passed - Na in normal range and within 180 days    Sodium  Date Value Ref Range Status  02/01/2022 144 134 - 144 mmol/L Final         Passed - Last BP in normal range    BP Readings from Last 1 Encounters:  03/09/22 124/83         Passed - Valid encounter within last 6 months    Recent Outpatient Visits          2 months ago Essential hypertension   Primary Care at [facility], MD   4 months ago Annual physical exam   Primary Care at [facility], MD   4 months ago Essential hypertension   Primary Care at [facility], Kriste Basque, NP   2 years ago Essential hypertension   Primary Care at [facility], Arlie Solomons, MD   2 years ago Encounter for Medicare annual wellness exam   Primary Care at [facility], Arlie Solomons, MD      Future Appointments            In 3 weeks Dorna Mai, MD Primary Care at [facility]

## 2022-06-07 DIAGNOSIS — H401121 Primary open-angle glaucoma, left eye, mild stage: Secondary | ICD-10-CM | POA: Diagnosis not present

## 2022-06-07 DIAGNOSIS — H25812 Combined forms of age-related cataract, left eye: Secondary | ICD-10-CM | POA: Diagnosis not present

## 2022-06-07 DIAGNOSIS — H2512 Age-related nuclear cataract, left eye: Secondary | ICD-10-CM | POA: Diagnosis not present

## 2022-06-11 ENCOUNTER — Other Ambulatory Visit: Payer: Self-pay | Admitting: Family Medicine

## 2022-06-11 DIAGNOSIS — Z1231 Encounter for screening mammogram for malignant neoplasm of breast: Secondary | ICD-10-CM

## 2022-06-24 ENCOUNTER — Other Ambulatory Visit: Payer: Self-pay | Admitting: *Deleted

## 2022-06-24 MED ORDER — MELOXICAM 7.5 MG PO TABS: 7.5000 mg | ORAL_TABLET | Freq: Every day | ORAL | 0 refills | Status: DC

## 2022-06-29 ENCOUNTER — Ambulatory Visit (INDEPENDENT_AMBULATORY_CARE_PROVIDER_SITE_OTHER): Payer: Medicare Other | Admitting: Family Medicine

## 2022-06-29 ENCOUNTER — Other Ambulatory Visit (HOSPITAL_COMMUNITY): Payer: Self-pay

## 2022-06-29 VITALS — BP 105/71 | HR 69 | Temp 97.7°F | Resp 16 | Wt 182.0 lb

## 2022-06-29 DIAGNOSIS — E785 Hyperlipidemia, unspecified: Secondary | ICD-10-CM

## 2022-06-29 DIAGNOSIS — E119 Type 2 diabetes mellitus without complications: Secondary | ICD-10-CM | POA: Diagnosis not present

## 2022-06-29 DIAGNOSIS — I10 Essential (primary) hypertension: Secondary | ICD-10-CM | POA: Diagnosis not present

## 2022-06-30 ENCOUNTER — Telehealth: Payer: Self-pay | Admitting: Family Medicine

## 2022-06-30 ENCOUNTER — Other Ambulatory Visit (HOSPITAL_COMMUNITY): Payer: Self-pay

## 2022-06-30 NOTE — Telephone Encounter (Signed)
Please fill medication

## 2022-06-30 NOTE — Telephone Encounter (Signed)
Copied from [place] [id number]. Topic: General - Inquiry >> Jun 30, 2022 12:36 PM Erskine Squibb wrote: Reason for CRM: Patient saw the provider yesterday and was supposed to have 2 different prescriptions called in to [facility] pharmacy but the patient states they are not there. Please assist patient as soon as possible.

## 2022-07-01 ENCOUNTER — Encounter: Payer: Self-pay | Admitting: Family Medicine

## 2022-07-01 MED ORDER — AMLODIPINE BESYLATE 10 MG PO TABS: 10.0000 mg | ORAL_TABLET | Freq: Every day | ORAL | 1 refills | Status: DC

## 2022-07-01 MED ORDER — HYDROCHLOROTHIAZIDE 25 MG PO TABS: 25.0000 mg | ORAL_TABLET | Freq: Every day | ORAL | 2 refills | Status: DC

## 2022-07-01 NOTE — Progress Notes (Signed)
Established Patient Office Visit  Subjective    Patient ID: Barbara Adkins, female    DOB: [birth year]  Age: 71 y.o. MRN: [id number]  CC:  Chief Complaint  Patient presents with   Follow-up    HPI Barbara Adkins presents for routine follow up of chronic med issues. Patient denies acute complaints or concerns.    Outpatient Encounter Medications as of 06/29/2022  Medication Sig   acetaminophen (TYLENOL) 325 MG tablet Take 2 tablets (650 mg total) by mouth every 6 (six) hours. (Patient not taking: Reported on 03/25/2020)   albuterol (VENTOLIN HFA) 108 (90 Base) MCG/ACT inhaler Inhale 2 puffs into the lungs every 4 (four) hours. (Patient not taking: Reported on 02/01/2022)   amLODipine (NORVASC) 10 MG tablet Take 1 tablet (10 mg total) by mouth daily.   atorvastatin (LIPITOR) 20 MG tablet TAKE 1 TABLET BY MOUTH ONCE  DAILY   Blood Pressure Monitoring (BLOOD PRESSURE KIT) DEVI 1 Device by Does not apply route 3 (three) times a week. Use to check blood pressure three times a week. ICD10 I10 (Patient not taking: Reported on 02/01/2022)   budesonide-formoterol (SYMBICORT) 160-4.5 MCG/ACT inhaler    budesonide-formoterol (SYMBICORT) 160-4.5 MCG/ACT inhaler Inhale 2 puffs into the lungs in the morning and at bedtime.   cetirizine (ZYRTEC) 10 MG tablet Take 1 tablet (10 mg total) by mouth daily.   fluticasone (FLONASE) 50 MCG/ACT nasal spray USE 2 SPRAYS IN EACH NOSTRIL DAILY.   fluticasone (FLONASE) 50 MCG/ACT nasal spray Place 2 sprays into both nostrils daily.   hydrochlorothiazide (HYDRODIURIL) 25 MG tablet TAKE 1 TABLET BY MOUTH DAILY   influenza vaccine adjuvanted (FLUAD) 0.5 ML injection Inject 0.5 mLs into the muscle. (Patient not taking: Reported on 02/01/2022)   ketorolac (ACULAR) 0.5 % ophthalmic solution Place 1 drop into the left eye 4 (four) times daily.   latanoprost (XALATAN) 0.005 % ophthalmic solution Place 1 drop into both eyes nightly. (Patient not taking: Reported on  02/01/2022)   latanoprost (XALATAN) 0.005 % ophthalmic solution Place 1 drop into both eyes nightly.   losartan (COZAAR) 100 MG tablet TAKE 1 TABLET BY MOUTH EVERY DAY   meloxicam (MOBIC) 7.5 MG tablet Take 1 tablet (7.5 mg total) by mouth daily.   METFORMIN & DIET MANAGE PROD PO    metFORMIN (GLUCOPHAGE) 500 MG tablet TAKE 1 TABLET BY MOUTH DAILY  WITH BREAKFAST   molnupiravir EUA (LAGEVRIO) 200 MG CAPS capsule 4 capsules   montelukast (SINGULAIR) 10 MG tablet TAKE 1 TABLET BY MOUTH AT  BEDTIME   omeprazole (PRILOSEC) 10 MG capsule TAKE 2 CAPSULES BY MOUTH DAILY   omeprazole (PRILOSEC) 20 MG capsule Take 1 capsule (20 mg total) by mouth daily.   prednisoLONE acetate (PRED FORTE) 1 % ophthalmic suspension Place 1 drop into the left eye 4 (four) times daily. Start using day after surgery as instructed   Timolol-Dorzolamid-Latanoprost 0.5-0.15-0.005 % SOLN Apply to eye.   No facility-administered encounter medications on file as of 06/29/2022.    Past Medical History:  Diagnosis Date   Allergy    Arthritis    Deviated septum    Diabetes mellitus without complication (HCC)    GERD (gastroesophageal reflux disease)    Hypertension     Past Surgical History:  Procedure Laterality Date   ETHMOIDECTOMY Right 05/17/2018   Procedure: RIGHT ETHMOIDECTOMY WITH TISSUE REMOVAL;  Surgeon: Leta Baptist, MD;  Location: [place];  Service: ENT;  Laterality: Right;   FRONTAL SINUS EXPLORATION  Right 05/17/2018   Procedure: RIGHT FRONTAL RECESS EXPLORATION;  Surgeon: Leta Baptist, MD;  Location: [place];  Service: ENT;  Laterality: Right;   MAXILLARY ANTROSTOMY Right 05/17/2018   Procedure: RIGHT MAXILLARY ANTROSTOMY WITH TISSUE REMOVAL;  Surgeon: Leta Baptist, MD;  Location: [place];  Service: ENT;  Laterality: Right;   NASAL SEPTOPLASTY W/ TURBINOPLASTY Bilateral 05/17/2018   Procedure: NASAL SEPTOPLASTY WITH TURBINATE REDUCTION;  Surgeon: Leta Baptist, MD;  Location:  [place];  Service: ENT;  Laterality: Bilateral;   SINUS ENDO WITH FUSION N/A 05/17/2018   Procedure: SINUS ENDO WITH FUSION;  Surgeon: Leta Baptist, MD;  Location: [place];  Service: ENT;  Laterality: N/A;   SPHENOIDECTOMY Right 05/17/2018   Procedure: RIGHT SPHENOIDECTOMY WITH TISSUE REMOVAL;  Surgeon: Leta Baptist, MD;  Location: [place];  Service: ENT;  Laterality: Right;   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   Procedure: TOTAL HIP ARTHROPLASTY;  Surgeon: Garald Balding, MD;  Location: [place];  Service: Orthopedics;  Laterality: Left;   TUBAL LIGATION      Family History  Problem Relation Age of Onset   Asthma Other    Cancer Other    Hypertension Other    Diabetes Other    Colon cancer Maternal Grandmother    Breast cancer Neg Hx    Esophageal cancer Neg Hx    Rectal cancer Neg Hx    Stomach cancer Neg Hx     Social History   Socioeconomic History   Marital status: Widowed    Spouse name: Not on file   Number of children: Not on file   Years of education: Not on file   Highest education level: Not on file  Occupational History   Not on file  Tobacco Use   Smoking status: Never   Smokeless tobacco: Never  Vaping Use   Vaping Use: Never used  Substance and Sexual Activity   Alcohol use: No   Drug use: Never   Sexual activity: Not on file  Other Topics Concern   Not on file  Social History Narrative   Not on file   Social Determinants of Health   Financial Resource Strain: Not on file  Food Insecurity: Not on file  Transportation Needs: Not on file  Physical Activity: Not on file  Stress: Not on file  Social Connections: Not on file  Intimate Partner Violence: Not on file    Review of Systems  All other systems reviewed and are negative.       Objective    BP 105/71   Pulse 69   Temp 97.7 F (36.5 C) (Oral)   Resp 16   Wt 182 lb (82.6 kg)   SpO2 97%   BMI 32.24 kg/m   Physical Exam Vitals and  nursing note reviewed.  Constitutional:      General: She is not in acute distress.    Appearance: She is well-developed.  Cardiovascular:     Rate and Rhythm: Normal rate and regular rhythm.  Pulmonary:     Effort: Pulmonary effort is normal.     Breath sounds: Normal breath sounds.  Abdominal:     Palpations: Abdomen is soft.     Tenderness: There is no abdominal tenderness.  Musculoskeletal:     Right lower leg: No edema.     Left lower leg: No edema.  Neurological:     General: No focal deficit present.     Mental  Status: She is alert and oriented to person, place, and time.         Assessment & Plan:   1. Essential hypertension Appears stable with present management. continue  2. Type 2 diabetes mellitus without complication, without long-term current use of insulin ([place]) continue  3. Hyperlipidemia, unspecified hyperlipidemia type continue     No follow-ups on file.   Becky Sax, MD

## 2022-07-14 ENCOUNTER — Other Ambulatory Visit: Payer: Self-pay | Admitting: Family Medicine

## 2022-07-14 NOTE — Telephone Encounter (Signed)
Requested medication (s) are due for refill today: Yes  Requested medication (s) are on the active medication list: Yes  Last refill:  06/24/22  Future visit scheduled: Yes  Notes to clinic:  See request    Requested Prescriptions  Pending Prescriptions Disp Refills   meloxicam (MOBIC) 7.5 MG tablet [Pharmacy Med Name: Meloxicam 7.5 MG Oral Tablet] 30 tablet 11    Sig: TAKE 1 TABLET BY MOUTH DAILY     Analgesics:  COX2 Inhibitors Failed - 07/14/2022  8:51 AM      Failed - Manual Review: Labs are only required if the patient has taken medication for more than 8 weeks.      Failed - Cr in normal range and within 360 days    Creatinine, Ser  Date Value Ref Range Status  02/01/2022 1.35 (H) 0.57 - 1.00 mg/dL Final         Passed - HGB in normal range and within 360 days    Hemoglobin  Date Value Ref Range Status  02/01/2022 11.7 11.1 - 15.9 g/dL Final         Passed - HCT in normal range and within 360 days    Hematocrit  Date Value Ref Range Status  02/01/2022 39.3 34.0 - 46.6 % Final         Passed - AST in normal range and within 360 days    AST  Date Value Ref Range Status  02/01/2022 23 0 - 40 IU/L Final         Passed - ALT in normal range and within 360 days    ALT  Date Value Ref Range Status  02/01/2022 10 0 - 32 IU/L Final         Passed - eGFR is 30 or above and within 360 days    GFR calc Af Amer  Date Value Ref Range Status  09/25/2019 62 >59 mL/min/1.73 Final   GFR calc non Af Amer  Date Value Ref Range Status  09/25/2019 53 (L) >59 mL/min/1.73 Final   eGFR  Date Value Ref Range Status  02/01/2022 42 (L) >59 mL/min/1.73 Final         Passed - Patient is not pregnant      Passed - Valid encounter within last 12 months    Recent Outpatient Visits           2 weeks ago Essential hypertension   Primary Care at [facility], MD   4 months ago Essential hypertension   Primary Care at [facility], MD   5  months ago Annual physical exam   Primary Care at [facility], MD   5 months ago Essential hypertension   Primary Care at [facility], Kriste Basque, NP   2 years ago Essential hypertension   Primary Care at [facility], Arlie Solomons, MD       Future Appointments             In 2 months Dorna Mai, MD Primary Care at [place]

## 2022-07-15 ENCOUNTER — Ambulatory Visit: Payer: Medicare Other | Admitting: Family Medicine

## 2022-07-26 ENCOUNTER — Ambulatory Visit
Admission: RE | Admit: 2022-07-26 | Discharge: 2022-07-26 | Disposition: A | Discharge status: Home / Self Care | Payer: Medicare Other | Source: Ambulatory Visit | Attending: Family Medicine | Admitting: Family Medicine

## 2022-07-26 DIAGNOSIS — Z1231 Encounter for screening mammogram for malignant neoplasm of breast: Secondary | ICD-10-CM

## 2022-07-28 ENCOUNTER — Other Ambulatory Visit: Payer: Self-pay | Admitting: Family Medicine

## 2022-07-29 ENCOUNTER — Other Ambulatory Visit: Payer: Self-pay | Admitting: Family Medicine

## 2022-08-04 ENCOUNTER — Other Ambulatory Visit: Payer: Self-pay | Admitting: Family Medicine

## 2022-08-04 ENCOUNTER — Other Ambulatory Visit (HOSPITAL_COMMUNITY): Payer: Self-pay

## 2022-08-04 DIAGNOSIS — J338 Other polyp of sinus: Secondary | ICD-10-CM | POA: Diagnosis not present

## 2022-08-04 DIAGNOSIS — J324 Chronic pansinusitis: Secondary | ICD-10-CM | POA: Diagnosis not present

## 2022-08-04 DIAGNOSIS — J310 Chronic rhinitis: Secondary | ICD-10-CM | POA: Diagnosis not present

## 2022-08-05 ENCOUNTER — Other Ambulatory Visit: Payer: Self-pay | Admitting: Family Medicine

## 2022-08-05 NOTE — Telephone Encounter (Signed)
Requested medication (s) are due for refill today - yes  Requested medication (s) are on the active medication list -yes  Future visit scheduled -yes  Last refill: 07/15/22 #30  Notes to clinic: Pharmacy requesting 1 year Rx- requires changes to original Rx- sent for review   Requested Prescriptions  Pending Prescriptions Disp Refills   meloxicam (MOBIC) 7.5 MG tablet [Pharmacy Med Name: Meloxicam 7.5 MG Oral Tablet] 30 tablet 11    Sig: TAKE 1 TABLET BY MOUTH DAILY     Analgesics:  COX2 Inhibitors Failed - 08/05/2022  7:22 AM      Failed - Manual Review: Labs are only required if the patient has taken medication for more than 8 weeks.      Failed - Cr in normal range and within 360 days    Creatinine, Ser  Date Value Ref Range Status  02/01/2022 1.35 (H) 0.57 - 1.00 mg/dL Final         Passed - HGB in normal range and within 360 days    Hemoglobin  Date Value Ref Range Status  02/01/2022 11.7 11.1 - 15.9 g/dL Final         Passed - HCT in normal range and within 360 days    Hematocrit  Date Value Ref Range Status  02/01/2022 39.3 34.0 - 46.6 % Final         Passed - AST in normal range and within 360 days    AST  Date Value Ref Range Status  02/01/2022 23 0 - 40 IU/L Final         Passed - ALT in normal range and within 360 days    ALT  Date Value Ref Range Status  02/01/2022 10 0 - 32 IU/L Final         Passed - eGFR is 30 or above and within 360 days    GFR calc Af Amer  Date Value Ref Range Status  09/25/2019 62 >59 mL/min/1.73 Final   GFR calc non Af Amer  Date Value Ref Range Status  09/25/2019 53 (L) >59 mL/min/1.73 Final   eGFR  Date Value Ref Range Status  02/01/2022 42 (L) >59 mL/min/1.73 Final         Passed - Patient is not pregnant      Passed - Valid encounter within last 12 months    Recent Outpatient Visits           1 month ago Essential hypertension   Primary Care at [facility], MD   4 months ago Essential  hypertension   Primary Care at [facility], MD   6 months ago Annual physical exam   Primary Care at [facility], MD   6 months ago Essential hypertension   Primary Care at [facility], Kriste Basque, NP   2 years ago Essential hypertension   Primary Care at [facility], Arlie Solomons, MD       Future Appointments             In 1 month Dorna Mai, MD Primary Care at [facility]               Requested Prescriptions  Pending Prescriptions Disp Refills   meloxicam (MOBIC) 7.5 MG tablet [Pharmacy Med Name: Meloxicam 7.5 MG Oral Tablet] 30 tablet 11    Sig: TAKE 1 TABLET BY MOUTH DAILY     Analgesics:  COX2 Inhibitors Failed - 08/05/2022  7:22 AM  Failed - Manual Review: Labs are only required if the patient has taken medication for more than 8 weeks.      Failed - Cr in normal range and within 360 days    Creatinine, Ser  Date Value Ref Range Status  02/01/2022 1.35 (H) 0.57 - 1.00 mg/dL Final         Passed - HGB in normal range and within 360 days    Hemoglobin  Date Value Ref Range Status  02/01/2022 11.7 11.1 - 15.9 g/dL Final         Passed - HCT in normal range and within 360 days    Hematocrit  Date Value Ref Range Status  02/01/2022 39.3 34.0 - 46.6 % Final         Passed - AST in normal range and within 360 days    AST  Date Value Ref Range Status  02/01/2022 23 0 - 40 IU/L Final         Passed - ALT in normal range and within 360 days    ALT  Date Value Ref Range Status  02/01/2022 10 0 - 32 IU/L Final         Passed - eGFR is 30 or above and within 360 days    GFR calc Af Amer  Date Value Ref Range Status  09/25/2019 62 >59 mL/min/1.73 Final   GFR calc non Af Amer  Date Value Ref Range Status  09/25/2019 53 (L) >59 mL/min/1.73 Final   eGFR  Date Value Ref Range Status  02/01/2022 42 (L) >59 mL/min/1.73 Final         Passed - Patient is not pregnant      Passed - Valid encounter  within last 12 months    Recent Outpatient Visits           1 month ago Essential hypertension   Primary Care at [facility], MD   4 months ago Essential hypertension   Primary Care at [facility], MD   6 months ago Annual physical exam   Primary Care at [facility], MD   6 months ago Essential hypertension   Primary Care at [facility], Kriste Basque, NP   2 years ago Essential hypertension   Primary Care at [facility], Arlie Solomons, MD       Future Appointments             In 1 month Dorna Mai, MD Primary Care at [facility]              e

## 2022-08-09 ENCOUNTER — Other Ambulatory Visit (HOSPITAL_COMMUNITY): Payer: Self-pay

## 2022-08-09 MED ORDER — OMEPRAZOLE 20 MG PO CPDR
20.0000 mg | DELAYED_RELEASE_CAPSULE | Freq: Every day | ORAL | 0 refills | Status: DC
  Filled 2022-08-09: qty 30, 30d supply, fill #0

## 2022-08-09 MED ORDER — BUDESONIDE-FORMOTEROL FUMARATE 160-4.5 MCG/ACT IN AERO
2.0000 | INHALATION_SPRAY | Freq: Two times a day (BID) | RESPIRATORY_TRACT | 1 refills | Status: DC
  Filled 2022-08-09: qty 10.2, 30d supply, fill #0
  Filled 2022-09-07: qty 10.2, 30d supply, fill #1

## 2022-08-10 ENCOUNTER — Other Ambulatory Visit (HOSPITAL_COMMUNITY): Payer: Self-pay

## 2022-08-11 ENCOUNTER — Other Ambulatory Visit (HOSPITAL_COMMUNITY): Payer: Self-pay

## 2022-08-11 MED ORDER — FLUAD QUADRIVALENT 0.5 ML IM PRSY
PREFILLED_SYRINGE | INTRAMUSCULAR | 0 refills | Status: DC
  Filled 2022-08-11: qty 0.5, 1d supply, fill #0

## 2022-08-31 ENCOUNTER — Other Ambulatory Visit (HOSPITAL_COMMUNITY): Payer: Self-pay

## 2022-09-03 ENCOUNTER — Other Ambulatory Visit (HOSPITAL_COMMUNITY): Payer: Self-pay

## 2022-09-07 ENCOUNTER — Other Ambulatory Visit (HOSPITAL_COMMUNITY): Payer: Self-pay

## 2022-09-23 ENCOUNTER — Other Ambulatory Visit: Payer: Self-pay | Admitting: Family Medicine

## 2022-09-23 NOTE — Telephone Encounter (Signed)
Unable to refill per protocol, Rx request is too soon. Last refill 07/01/22 for 90 and 1 refill.E-Prescribing Status: Receipt confirmed by pharmacy (07/01/2022  3:59 PM EDT).  Requested Prescriptions  Pending Prescriptions Disp Refills   amLODipine (NORVASC) 10 MG tablet [Pharmacy Med Name: amLODIPine Besylate 10 MG Oral Tablet] 100 tablet 2    Sig: TAKE 1 TABLET BY MOUTH DAILY     Cardiovascular: Calcium Channel Blockers 2 Passed - 09/23/2022  8:11 AM      Passed - Last BP in normal range    BP Readings from Last 1 Encounters:  06/29/22 105/71         Passed - Last Heart Rate in normal range    Pulse Readings from Last 1 Encounters:  06/29/22 69         Passed - Valid encounter within last 6 months    Recent Outpatient Visits           2 months ago Essential hypertension   Primary Care at [facility], MD   6 months ago Essential hypertension   Primary Care at [facility], MD   7 months ago Annual physical exam   Primary Care at [facility], MD   8 months ago Essential hypertension   Primary Care at [facility], Kriste Basque, NP   2 years ago Essential hypertension   Primary Care at [facility], Arlie Solomons, MD       Future Appointments             In 4 days Dorna Mai, MD Primary Care at [facility]

## 2022-09-27 ENCOUNTER — Encounter: Payer: Self-pay | Admitting: Family Medicine

## 2022-09-27 ENCOUNTER — Ambulatory Visit (INDEPENDENT_AMBULATORY_CARE_PROVIDER_SITE_OTHER): Payer: Medicare Other | Admitting: Family Medicine

## 2022-09-27 ENCOUNTER — Other Ambulatory Visit (HOSPITAL_COMMUNITY): Payer: Self-pay

## 2022-09-27 VITALS — BP 121/82 | HR 64 | Temp 98.1°F | Resp 16 | Wt 186.4 lb

## 2022-09-27 DIAGNOSIS — J4520 Mild intermittent asthma, uncomplicated: Secondary | ICD-10-CM

## 2022-09-27 DIAGNOSIS — E119 Type 2 diabetes mellitus without complications: Secondary | ICD-10-CM

## 2022-09-27 DIAGNOSIS — Z6833 Body mass index (BMI) 33.0-33.9, adult: Secondary | ICD-10-CM

## 2022-09-27 DIAGNOSIS — E6609 Other obesity due to excess calories: Secondary | ICD-10-CM | POA: Diagnosis not present

## 2022-09-27 DIAGNOSIS — I10 Essential (primary) hypertension: Secondary | ICD-10-CM

## 2022-09-27 DIAGNOSIS — E785 Hyperlipidemia, unspecified: Secondary | ICD-10-CM

## 2022-09-27 LAB — POCT GLYCOSYLATED HEMOGLOBIN (HGB A1C): Hemoglobin A1C: 5.9 % — AB (ref 4.0–5.6)

## 2022-09-27 MED ORDER — BUDESONIDE-FORMOTEROL FUMARATE 80-4.5 MCG/ACT IN AERO
2.0000 | INHALATION_SPRAY | Freq: Two times a day (BID) | RESPIRATORY_TRACT | 12 refills | Status: DC
  Filled 2022-09-27: qty 10.2, 30d supply, fill #0
  Filled 2022-10-23 – 2022-10-28 (×3): qty 10.2, 30d supply, fill #1

## 2022-09-27 MED ORDER — PHENTERMINE HCL 37.5 MG PO CAPS: 37.5000 mg | ORAL_CAPSULE | ORAL | 0 refills | Status: DC

## 2022-09-27 NOTE — Progress Notes (Unsigned)
Patient is here for their 3 month follow-up Patient has no concerns today Care gaps have been discussed with patient  

## 2022-09-29 ENCOUNTER — Encounter: Payer: Self-pay | Admitting: Family Medicine

## 2022-09-29 NOTE — Progress Notes (Signed)
Established Patient Office Visit  Subjective    Patient ID: Barbara Adkins, female    DOB: 01/14/1951  Age: 71 y.o. MRN: [id number]  CC:  Chief Complaint  Patient presents with   Follow-up    3 month    HPI Barbara Adkins presents for routine follow up of chronic med issues including diabetes and hypertension. Patient denies acute complaints or concerns.   Outpatient Encounter Medications as of 09/27/2022  Medication Sig   amLODipine (NORVASC) 10 MG tablet Take 1 tablet (10 mg total) by mouth daily.   atorvastatin (LIPITOR) 20 MG tablet TAKE 1 TABLET BY MOUTH ONCE  DAILY   cetirizine (ZYRTEC) 10 MG tablet Take 1 tablet (10 mg total) by mouth daily.   fluticasone (FLONASE) 50 MCG/ACT nasal spray USE 2 SPRAYS IN EACH NOSTRIL DAILY.   fluticasone (FLONASE) 50 MCG/ACT nasal spray Place 2 sprays into both nostrils daily.   hydrochlorothiazide (HYDRODIURIL) 25 MG tablet Take 1 tablet (25 mg total) by mouth daily.   influenza vaccine adjuvanted (FLUAD QUADRIVALENT) 0.5 ML injection Inject into the muscle.   ketorolac (ACULAR) 0.5 % ophthalmic solution Place 1 drop into the left eye 4 (four) times daily.   latanoprost (XALATAN) 0.005 % ophthalmic solution Place 1 drop into both eyes nightly.   losartan (COZAAR) 100 MG tablet TAKE 1 TABLET BY MOUTH EVERY DAY   meloxicam (MOBIC) 7.5 MG tablet TAKE 1 TABLET BY MOUTH DAILY   METFORMIN & DIET MANAGE PROD PO    metFORMIN (GLUCOPHAGE) 500 MG tablet TAKE 1 TABLET BY MOUTH DAILY  WITH BREAKFAST   molnupiravir EUA (LAGEVRIO) 200 MG CAPS capsule 4 capsules   montelukast (SINGULAIR) 10 MG tablet TAKE 1 TABLET BY MOUTH AT  BEDTIME   omeprazole (PRILOSEC) 10 MG capsule TAKE 2 CAPSULES BY MOUTH DAILY   omeprazole (PRILOSEC) 20 MG capsule Take 1 capsule (20 mg total) by mouth daily.   phentermine 37.5 MG capsule Take 1 capsule (37.5 mg total) by mouth every morning.   prednisoLONE acetate (PRED FORTE) 1 % ophthalmic suspension Place 1 drop  into the left eye 4 (four) times daily. Start using day after surgery as instructed   Timolol-Dorzolamid-Latanoprost 0.5-0.15-0.005 % SOLN Apply to eye.   [DISCONTINUED] budesonide-formoterol (SYMBICORT) 160-4.5 MCG/ACT inhaler    [DISCONTINUED] budesonide-formoterol (SYMBICORT) 160-4.5 MCG/ACT inhaler Inhale 2 puffs into the lungs in the morning and at bedtime.   acetaminophen (TYLENOL) 325 MG tablet Take 2 tablets (650 mg total) by mouth every 6 (six) hours. (Patient not taking: Reported on 03/25/2020)   albuterol (VENTOLIN HFA) 108 (90 Base) MCG/ACT inhaler Inhale 2 puffs into the lungs every 4 (four) hours. (Patient not taking: Reported on 02/01/2022)   atorvastatin (LIPITOR) 10 MG tablet Atorvastatin Calcium   Blood Pressure Monitoring (BLOOD PRESSURE KIT) DEVI 1 Device by Does not apply route 3 (three) times a week. Use to check blood pressure three times a week. ICD10 I10 (Patient not taking: Reported on 02/01/2022)   budesonide-formoterol (SYMBICORT) 80-4.5 MCG/ACT inhaler Symbicort   budesonide-formoterol (SYMBICORT) 80-4.5 MCG/ACT inhaler Inhale 2 puffs into the lungs in the morning and at bedtime.   fluticasone (FLONASE ALLERGY RELIEF) 50 MCG/ACT nasal spray Flonase   influenza vaccine adjuvanted (FLUAD) 0.5 ML injection Inject 0.5 mLs into the muscle. (Patient not taking: Reported on 02/01/2022)   latanoprost (XALATAN) 0.005 % ophthalmic solution Place 1 drop into both eyes nightly. (Patient not taking: Reported on 02/01/2022)   loratadine (CLARITIN) 5 MG chewable tablet Claritin  losartan (COZAAR) 25 MG tablet Losartan Potassium   Meloxicam 5 MG CAPS Meloxicam   METFORMIN & DIET MANAGE PROD PO Metformin & Diet Manage Prod   omeprazole (KONVOMEP) 2 mg/mL SUSP oral suspension Omeprazole   No facility-administered encounter medications on file as of 09/27/2022.    Past Medical History:  Diagnosis Date   Allergy    Arthritis    Deviated septum    Diabetes mellitus without complication  (HCC)    GERD (gastroesophageal reflux disease)    Hypertension     Past Surgical History:  Procedure Laterality Date   ETHMOIDECTOMY Right 05/17/2018   Procedure: RIGHT ETHMOIDECTOMY WITH TISSUE REMOVAL;  Surgeon: Leta Baptist, MD;  Location: [place];  Service: ENT;  Laterality: Right;   FRONTAL SINUS EXPLORATION Right 05/17/2018   Procedure: RIGHT FRONTAL RECESS EXPLORATION;  Surgeon: Leta Baptist, MD;  Location: [place];  Service: ENT;  Laterality: Right;   MAXILLARY ANTROSTOMY Right 05/17/2018   Procedure: RIGHT MAXILLARY ANTROSTOMY WITH TISSUE REMOVAL;  Surgeon: Leta Baptist, MD;  Location: [place];  Service: ENT;  Laterality: Right;   NASAL SEPTOPLASTY W/ TURBINOPLASTY Bilateral 05/17/2018   Procedure: NASAL SEPTOPLASTY WITH TURBINATE REDUCTION;  Surgeon: Leta Baptist, MD;  Location: [place];  Service: ENT;  Laterality: Bilateral;   SINUS ENDO WITH FUSION N/A 05/17/2018   Procedure: SINUS ENDO WITH FUSION;  Surgeon: Leta Baptist, MD;  Location: [place];  Service: ENT;  Laterality: N/A;   SPHENOIDECTOMY Right 05/17/2018   Procedure: RIGHT SPHENOIDECTOMY WITH TISSUE REMOVAL;  Surgeon: Leta Baptist, MD;  Location: [place];  Service: ENT;  Laterality: Right;   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   Procedure: TOTAL HIP ARTHROPLASTY;  Surgeon: Garald Balding, MD;  Location: [place];  Service: Orthopedics;  Laterality: Left;   TUBAL LIGATION      Family History  Problem Relation Age of Onset   Asthma Other    Cancer Other    Hypertension Other    Diabetes Other    Colon cancer Maternal Grandmother    Breast cancer Neg Hx    Esophageal cancer Neg Hx    Rectal cancer Neg Hx    Stomach cancer Neg Hx     Social History   Socioeconomic History   Marital status: Widowed    Spouse name: Not on file   Number of children: Not on file   Years of education: Not on file   Highest education level: Not on file   Occupational History   Not on file  Tobacco Use   Smoking status: Never   Smokeless tobacco: Never  Vaping Use   Vaping Use: Never used  Substance and Sexual Activity   Alcohol use: No   Drug use: Never   Sexual activity: Not on file  Other Topics Concern   Not on file  Social History Narrative   Not on file   Social Determinants of Health   Financial Resource Strain: Not on file  Food Insecurity: Not on file  Transportation Needs: Not on file  Physical Activity: Not on file  Stress: Not on file  Social Connections: Not on file  Intimate Partner Violence: Not on file    Review of Systems  All other systems reviewed and are negative.       Objective    BP 121/82   Pulse 64   Temp 98.1 F (36.7 C) (Oral)   Resp 16  Wt 186 lb 6.4 oz (84.6 kg)   SpO2 97%   BMI 33.02 kg/m   Physical Exam Vitals and nursing note reviewed.  Constitutional:      General: She is not in acute distress.    Appearance: She is obese.  Cardiovascular:     Rate and Rhythm: Normal rate and regular rhythm.  Pulmonary:     Effort: Pulmonary effort is normal.     Breath sounds: Normal breath sounds.  Abdominal:     Palpations: Abdomen is soft.     Tenderness: There is no abdominal tenderness.  Musculoskeletal:     Right lower leg: No edema.     Left lower leg: No edema.  Neurological:     General: No focal deficit present.     Mental Status: She is alert and oriented to person, place, and time.         Assessment & Plan:   1. Essential hypertension Appears stable. Continue   2. Type 2 diabetes mellitus without complication, without long-term current use of insulin (HCC) A1c is at goal. Continue and monitor - POCT glycosylated hemoglobin (Hb A1C)  3. Hyperlipidemia, unspecified hyperlipidemia type Continue  4. Class 1 obesity due to excess calories with serious comorbidity and body mass index (BMI) of 33.0 to 33.9 in adult Phentermine prescribed. Goal is 2-4lbs/mo wt  loss.   5. Mild intermittent asthma without complication Appears stable. Symbicort prescribed.     Return in about 4 weeks (around 10/25/2022) for follow up.   Becky Sax, MD

## 2022-10-21 ENCOUNTER — Other Ambulatory Visit: Payer: Self-pay | Admitting: Family Medicine

## 2022-10-23 ENCOUNTER — Other Ambulatory Visit (HOSPITAL_COMMUNITY): Payer: Self-pay

## 2022-10-23 ENCOUNTER — Other Ambulatory Visit: Payer: Self-pay | Admitting: Family Medicine

## 2022-10-25 ENCOUNTER — Other Ambulatory Visit (HOSPITAL_COMMUNITY): Payer: Self-pay

## 2022-10-25 MED ORDER — OMEPRAZOLE 20 MG PO CPDR
20.0000 mg | DELAYED_RELEASE_CAPSULE | Freq: Every day | ORAL | 0 refills | Status: AC
  Filled 2022-10-25 – 2022-11-02 (×2): qty 30, 30d supply, fill #0

## 2022-10-26 ENCOUNTER — Other Ambulatory Visit (HOSPITAL_COMMUNITY): Payer: Self-pay

## 2022-10-27 ENCOUNTER — Ambulatory Visit: Payer: Medicare Other | Admitting: Family Medicine

## 2022-10-27 ENCOUNTER — Other Ambulatory Visit: Payer: Self-pay | Admitting: Family Medicine

## 2022-10-27 NOTE — Telephone Encounter (Signed)
Medication Refill - Medication: phentermine 37.5 MG capsule [[id number]]   Has the patient contacted their pharmacy? No. Pt had an appt today, was late,. and the office reschedule her for Jan Preferred Pharmacy (with phone number or street name): [place]  Has the patient been seen for an appointment in the last year OR does the patient have an upcoming appointment? Yes.    Agent: Please be advised that RX refills may take up to 3 business days. We ask that you follow-up with your pharmacy.

## 2022-10-27 NOTE — Telephone Encounter (Signed)
Requested medication (s) are due for refill today: yes  Requested medication (s) are on the active medication list: yes  Last refill:  09/27/22 #30/0  Future visit scheduled: yes  Notes to clinic:  Unable to refill per protocol, cannot delegate.    Requested Prescriptions  Pending Prescriptions Disp Refills   phentermine 37.5 MG capsule 30 capsule 0    Sig: Take 1 capsule (37.5 mg total) by mouth every morning.     Not Delegated - Neurology: Anticonvulsants - Controlled - phentermine hydrochloride Failed - 10/27/2022 10:11 AM      Failed - This refill cannot be delegated      Failed - eGFR in normal range and within 360 days    GFR calc Af Amer  Date Value Ref Range Status  09/25/2019 62 >59 mL/min/1.73 Final   GFR calc non Af Amer  Date Value Ref Range Status  09/25/2019 53 (L) >59 mL/min/1.73 Final   eGFR  Date Value Ref Range Status  02/01/2022 42 (L) >59 mL/min/1.73 Final         Failed - Cr in normal range and within 360 days    Creatinine, Ser  Date Value Ref Range Status  02/01/2022 1.35 (H) 0.57 - 1.00 mg/dL Final         Passed - Last BP in normal range    BP Readings from Last 1 Encounters:  09/27/22 121/82         Passed - Valid encounter within last 6 months    Recent Outpatient Visits           1 month ago Essential hypertension   Primary Care at [facility], MD   4 months ago Essential hypertension   Primary Care at [facility], MD   7 months ago Essential hypertension   [facility], MD   8 months ago Annual physical exam   Primary Care at [facility], MD   9 months ago Essential hypertension   Primary Care at [facility], Kriste Basque, NP       Future Appointments             In 4 weeks Dorna Mai, MD Primary Care at [facility] - Weight completed in the last 3 months    Wt Readings from Last 1 Encounters:   09/27/22 186 lb 6.4 oz (84.6 kg)

## 2022-10-28 ENCOUNTER — Other Ambulatory Visit (HOSPITAL_COMMUNITY): Payer: Self-pay

## 2022-10-28 ENCOUNTER — Other Ambulatory Visit: Payer: Self-pay | Admitting: Family Medicine

## 2022-10-28 DIAGNOSIS — H401121 Primary open-angle glaucoma, left eye, mild stage: Secondary | ICD-10-CM | POA: Diagnosis not present

## 2022-10-28 DIAGNOSIS — H524 Presbyopia: Secondary | ICD-10-CM | POA: Diagnosis not present

## 2022-10-28 DIAGNOSIS — H52223 Regular astigmatism, bilateral: Secondary | ICD-10-CM | POA: Diagnosis not present

## 2022-10-28 DIAGNOSIS — Z961 Presence of intraocular lens: Secondary | ICD-10-CM | POA: Diagnosis not present

## 2022-10-28 DIAGNOSIS — H40051 Ocular hypertension, right eye: Secondary | ICD-10-CM | POA: Diagnosis not present

## 2022-10-28 DIAGNOSIS — H26493 Other secondary cataract, bilateral: Secondary | ICD-10-CM | POA: Diagnosis not present

## 2022-10-28 DIAGNOSIS — Z83511 Family history of glaucoma: Secondary | ICD-10-CM | POA: Diagnosis not present

## 2022-10-28 DIAGNOSIS — H5203 Hypermetropia, bilateral: Secondary | ICD-10-CM | POA: Diagnosis not present

## 2022-10-28 DIAGNOSIS — H33322 Round hole, left eye: Secondary | ICD-10-CM | POA: Diagnosis not present

## 2022-11-02 ENCOUNTER — Encounter: Payer: Self-pay | Admitting: Family Medicine

## 2022-11-02 ENCOUNTER — Ambulatory Visit (INDEPENDENT_AMBULATORY_CARE_PROVIDER_SITE_OTHER): Payer: Medicare Other | Admitting: Family Medicine

## 2022-11-02 ENCOUNTER — Other Ambulatory Visit (HOSPITAL_COMMUNITY): Payer: Self-pay

## 2022-11-02 VITALS — BP 116/78 | HR 73 | Temp 98.1°F | Resp 16 | Wt 182.2 lb

## 2022-11-02 DIAGNOSIS — E6609 Other obesity due to excess calories: Secondary | ICD-10-CM | POA: Diagnosis not present

## 2022-11-02 DIAGNOSIS — Z6832 Body mass index (BMI) 32.0-32.9, adult: Secondary | ICD-10-CM

## 2022-11-02 DIAGNOSIS — I10 Essential (primary) hypertension: Secondary | ICD-10-CM

## 2022-11-02 DIAGNOSIS — Z7689 Persons encountering health services in other specified circumstances: Secondary | ICD-10-CM

## 2022-11-02 MED ORDER — PHENTERMINE HCL 37.5 MG PO CAPS: 37.5000 mg | ORAL_CAPSULE | ORAL | 0 refills | Status: DC

## 2022-11-03 ENCOUNTER — Encounter: Payer: Self-pay | Admitting: Family Medicine

## 2022-11-03 ENCOUNTER — Other Ambulatory Visit (HOSPITAL_COMMUNITY): Payer: Self-pay

## 2022-11-03 NOTE — Progress Notes (Signed)
Established Patient Office Visit  Subjective    Patient ID: Barbara Adkins, female    DOB: 18-Jan-1951  Age: 71 y.o. MRN: [id number]  CC:  Chief Complaint  Patient presents with   Follow-up   Hypertension    HPI [name] presents for routine weight management and follow up of hypertension. Patient denies acute complaints or concerns.    Outpatient Encounter Medications as of 11/02/2022  Medication Sig   acetaminophen (TYLENOL) 325 MG tablet Take 2 tablets (650 mg total) by mouth every 6 (six) hours. (Patient not taking: Reported on 03/25/2020)   albuterol (VENTOLIN HFA) 108 (90 Base) MCG/ACT inhaler Inhale 2 puffs into the lungs every 4 (four) hours. (Patient not taking: Reported on 02/01/2022)   amLODipine (NORVASC) 10 MG tablet Take 1 tablet (10 mg total) by mouth daily.   atorvastatin (LIPITOR) 10 MG tablet Atorvastatin Calcium   atorvastatin (LIPITOR) 20 MG tablet TAKE 1 TABLET BY MOUTH ONCE  DAILY   Blood Pressure Monitoring (BLOOD PRESSURE KIT) DEVI 1 Device by Does not apply route 3 (three) times a week. Use to check blood pressure three times a week. ICD10 I10 (Patient not taking: Reported on 02/01/2022)   budesonide-formoterol (SYMBICORT) 80-4.5 MCG/ACT inhaler Symbicort   budesonide-formoterol (SYMBICORT) 80-4.5 MCG/ACT inhaler Inhale 2 puffs into the lungs in the morning and at bedtime.   cetirizine (ZYRTEC) 10 MG tablet Take 1 tablet (10 mg total) by mouth daily.   fluticasone (FLONASE ALLERGY RELIEF) 50 MCG/ACT nasal spray Flonase   fluticasone (FLONASE) 50 MCG/ACT nasal spray USE 2 SPRAYS IN EACH NOSTRIL DAILY.   fluticasone (FLONASE) 50 MCG/ACT nasal spray Place 2 sprays into both nostrils daily.   hydrochlorothiazide (HYDRODIURIL) 25 MG tablet Take 1 tablet (25 mg total) by mouth daily.   influenza vaccine adjuvanted (FLUAD QUADRIVALENT) 0.5 ML injection Inject into the muscle.   influenza vaccine adjuvanted (FLUAD) 0.5 ML injection Inject 0.5 mLs into  the muscle. (Patient not taking: Reported on 02/01/2022)   ketorolac (ACULAR) 0.5 % ophthalmic solution Place 1 drop into the left eye 4 (four) times daily.   latanoprost (XALATAN) 0.005 % ophthalmic solution Place 1 drop into both eyes nightly. (Patient not taking: Reported on 02/01/2022)   latanoprost (XALATAN) 0.005 % ophthalmic solution Place 1 drop into both eyes nightly.   loratadine (CLARITIN) 5 MG chewable tablet Claritin   losartan (COZAAR) 100 MG tablet TAKE 1 TABLET BY MOUTH EVERY DAY   losartan (COZAAR) 25 MG tablet Losartan Potassium   meloxicam (MOBIC) 7.5 MG tablet TAKE 1 TABLET BY MOUTH DAILY   Meloxicam 5 MG CAPS Meloxicam   METFORMIN & DIET MANAGE PROD PO    METFORMIN & DIET MANAGE PROD PO Metformin & Diet Manage Prod   metFORMIN (GLUCOPHAGE) 500 MG tablet TAKE 1 TABLET BY MOUTH DAILY  WITH BREAKFAST   molnupiravir EUA (LAGEVRIO) 200 MG CAPS capsule 4 capsules   montelukast (SINGULAIR) 10 MG tablet TAKE 1 TABLET BY MOUTH AT  BEDTIME   omeprazole (KONVOMEP) 2 mg/mL SUSP oral suspension Omeprazole   omeprazole (PRILOSEC) 10 MG capsule TAKE 2 CAPSULES BY MOUTH DAILY   omeprazole (PRILOSEC) 20 MG capsule Take 1 capsule (20 mg total) by mouth daily.   phentermine 37.5 MG capsule Take 1 capsule (37.5 mg total) by mouth every morning.   prednisoLONE acetate (PRED FORTE) 1 % ophthalmic suspension Place 1 drop into the left eye 4 (four) times daily. Start using day after surgery as instructed   Timolol-Dorzolamid-Latanoprost 0.5-0.15-0.005 %  SOLN Apply to eye.   [DISCONTINUED] phentermine 37.5 MG capsule Take 1 capsule (37.5 mg total) by mouth every morning.   No facility-administered encounter medications on file as of 11/02/2022.    Past Medical History:  Diagnosis Date   Allergy    Arthritis    Deviated septum    Diabetes mellitus without complication (HCC)    GERD (gastroesophageal reflux disease)    Hypertension     Past Surgical History:  Procedure Laterality Date    ETHMOIDECTOMY Right 05/17/2018   Procedure: RIGHT ETHMOIDECTOMY WITH TISSUE REMOVAL;  Surgeon: Leta Baptist, MD;  Location: [place];  Service: ENT;  Laterality: Right;   FRONTAL SINUS EXPLORATION Right 05/17/2018   Procedure: RIGHT FRONTAL RECESS EXPLORATION;  Surgeon: Leta Baptist, MD;  Location: [place];  Service: ENT;  Laterality: Right;   MAXILLARY ANTROSTOMY Right 05/17/2018   Procedure: RIGHT MAXILLARY ANTROSTOMY WITH TISSUE REMOVAL;  Surgeon: Leta Baptist, MD;  Location: [place];  Service: ENT;  Laterality: Right;   NASAL SEPTOPLASTY W/ TURBINOPLASTY Bilateral 05/17/2018   Procedure: NASAL SEPTOPLASTY WITH TURBINATE REDUCTION;  Surgeon: Leta Baptist, MD;  Location: [place];  Service: ENT;  Laterality: Bilateral;   SINUS ENDO WITH FUSION N/A 05/17/2018   Procedure: SINUS ENDO WITH FUSION;  Surgeon: Leta Baptist, MD;  Location: [place];  Service: ENT;  Laterality: N/A;   SPHENOIDECTOMY Right 05/17/2018   Procedure: RIGHT SPHENOIDECTOMY WITH TISSUE REMOVAL;  Surgeon: Leta Baptist, MD;  Location: [place];  Service: ENT;  Laterality: Right;   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   Procedure: TOTAL HIP ARTHROPLASTY;  Surgeon: Garald Balding, MD;  Location: [place];  Service: Orthopedics;  Laterality: Left;   TUBAL LIGATION      Family History  Problem Relation Age of Onset   Asthma Other    Cancer Other    Hypertension Other    Diabetes Other    Colon cancer Maternal Grandmother    Breast cancer Neg Hx    Esophageal cancer Neg Hx    Rectal cancer Neg Hx    Stomach cancer Neg Hx     Social History   Socioeconomic History   Marital status: Widowed    Spouse name: Not on file   Number of children: Not on file   Years of education: Not on file   Highest education level: Not on file  Occupational History   Not on file  Tobacco Use   Smoking status: Never   Smokeless tobacco: Never  Vaping Use   Vaping  Use: Never used  Substance and Sexual Activity   Alcohol use: No   Drug use: Never   Sexual activity: Not on file  Other Topics Concern   Not on file  Social History Narrative   Not on file   Social Determinants of Health   Financial Resource Strain: Not on file  Food Insecurity: Not on file  Transportation Needs: Not on file  Physical Activity: Not on file  Stress: Not on file  Social Connections: Not on file  Intimate Partner Violence: Not on file    Review of Systems  All other systems reviewed and are negative.       Objective    BP 116/78   Pulse 73   Temp 98.1 F (36.7 C) (Oral)   Resp 16   Wt 182 lb 3.2 oz (82.6 kg)   SpO2 95%   BMI 32.28 kg/m  Physical Exam Vitals and nursing note reviewed.  Constitutional:      General: She is not in acute distress.    Appearance: She is obese.  Cardiovascular:     Rate and Rhythm: Normal rate and regular rhythm.  Pulmonary:     Effort: Pulmonary effort is normal.     Breath sounds: Normal breath sounds.  Abdominal:     Palpations: Abdomen is soft.     Tenderness: There is no abdominal tenderness.  Musculoskeletal:     Right lower leg: No edema.     Left lower leg: No edema.  Neurological:     General: No focal deficit present.     Mental Status: She is alert and oriented to person, place, and time.         Assessment & Plan:   1. Encounter for weight management Dicussed dietary and activity options. Phentermine prescribed. Goal is 3-5 lbs/wt loss. month  2. Class 1 obesity due to excess calories with serious comorbidity and body mass index (BMI) of 32.0 to 32.9 in adult   3. Essential hypertension Appears stable. continue    Return in about 4 weeks (around 11/30/2022) for follow up.   Becky Sax, MD

## 2022-11-04 ENCOUNTER — Other Ambulatory Visit (HOSPITAL_COMMUNITY): Payer: Self-pay

## 2022-11-09 ENCOUNTER — Other Ambulatory Visit (HOSPITAL_COMMUNITY): Payer: Self-pay

## 2022-11-10 ENCOUNTER — Other Ambulatory Visit: Payer: Self-pay | Admitting: Family Medicine

## 2022-11-10 ENCOUNTER — Other Ambulatory Visit (HOSPITAL_COMMUNITY): Payer: Self-pay

## 2022-11-10 MED ORDER — FLUTICASONE PROPIONATE 50 MCG/ACT NA SUSP
2.0000 | Freq: Every day | NASAL | 3 refills | Status: AC
  Filled 2022-11-10: qty 16, 30d supply, fill #0

## 2022-11-25 ENCOUNTER — Ambulatory Visit: Payer: Medicare Other | Admitting: Family Medicine

## 2022-11-30 ENCOUNTER — Other Ambulatory Visit (HOSPITAL_COMMUNITY): Payer: Self-pay

## 2022-11-30 ENCOUNTER — Other Ambulatory Visit: Payer: Self-pay | Admitting: Family Medicine

## 2022-11-30 ENCOUNTER — Telehealth: Payer: Self-pay | Admitting: Family Medicine

## 2022-11-30 ENCOUNTER — Other Ambulatory Visit: Payer: Self-pay | Admitting: *Deleted

## 2022-11-30 MED ORDER — AMLODIPINE BESYLATE 10 MG PO TABS
10.0000 mg | ORAL_TABLET | Freq: Every day | ORAL | 0 refills | Status: DC
  Filled 2022-11-30: qty 90, 90d supply, fill #0

## 2022-11-30 NOTE — Telephone Encounter (Signed)
Medication Refill - Medication:   Disp Refills Start End   amLODipine (NORVASC) 10 MG tablet 90 tablet 1 07/01/2022    Sig - Route: Take 1 tablet (10 mg total) by mouth daily. - Oral     Has the patient contacted their pharmacy? No. (Agent: If no, request that the patient contact the pharmacy for the refill. If patient does not wish to contact the pharmacy document the reason why and proceed with request.) Pt was using mail order but is almot out of med and is leaving to go out of town, wants sent to local pharmacy below instead, Pt did not get refill that is available from mail order.  (Agent: If yes, when and what did the pharmacy advise?)  Preferred Pharmacy (with phone number or street name):  [place] Phone: [phone]  Fax: [phone]     Has the patient been seen for an appointment in the last year OR does the patient have an upcoming appointment? Yes.    Agent: Please be advised that RX refills may take up to 3 business days. We ask that you follow-up with your pharmacy.

## 2022-11-30 NOTE — Telephone Encounter (Signed)
Refill has been sent.  °

## 2022-11-30 NOTE — Telephone Encounter (Signed)
Requested medication (s) are due for refill today - provider review   Requested medication (s) are on the active medication list -yes  Future visit scheduled -yes  Last refill: 11/02/22 #30  Notes to clinic: non delegated Rx  Requested Prescriptions  Pending Prescriptions Disp Refills   phentermine 37.5 MG capsule [Pharmacy Med Name: Phentermine HCl 37.5 MG Oral Capsule] 30 capsule 0    Sig: Take 1 capsule by mouth in the morning     Not Delegated - Neurology: Anticonvulsants - Controlled - phentermine hydrochloride Failed - 11/30/2022  2:48 PM      Failed - This refill cannot be delegated      Failed - eGFR in normal range and within 360 days    GFR calc Af Amer  Date Value Ref Range Status  09/25/2019 62 >59 mL/min/1.73 Final   GFR calc non Af Amer  Date Value Ref Range Status  09/25/2019 53 (L) >59 mL/min/1.73 Final   eGFR  Date Value Ref Range Status  02/01/2022 42 (L) >59 mL/min/1.73 Final         Failed - Cr in normal range and within 360 days    Creatinine, Ser  Date Value Ref Range Status  02/01/2022 1.35 (H) 0.57 - 1.00 mg/dL Final         Passed - Last BP in normal range    BP Readings from Last 1 Encounters:  11/02/22 116/78         Passed - Valid encounter within last 6 months    Recent Outpatient Visits           4 weeks ago Encounter for weight management   Primary Care at [facility], MD   2 months ago Essential hypertension   Primary Care at [facility], MD   5 months ago Essential hypertension   Primary Care at [facility], MD   8 months ago Essential hypertension   Primary Care at [facility], Clyde Canterbury, MD   10 months ago Annual physical exam   Primary Care at [facility], Clyde Canterbury, MD       Future Appointments             In 1 week Dorna Mai, MD Primary Care at [facility] - Weight completed in the last 3 months    Wt Readings from  Last 1 Encounters:  11/02/22 182 lb 3.2 oz (82.6 kg)            Requested Prescriptions  Pending Prescriptions Disp Refills   phentermine 37.5 MG capsule [Pharmacy Med Name: Phentermine HCl 37.5 MG Oral Capsule] 30 capsule 0    Sig: Take 1 capsule by mouth in the morning     Not Delegated - Neurology: Anticonvulsants - Controlled - phentermine hydrochloride Failed - 11/30/2022  2:48 PM      Failed - This refill cannot be delegated      Failed - eGFR in normal range and within 360 days    GFR calc Af Amer  Date Value Ref Range Status  09/25/2019 62 >59 mL/min/1.73 Final   GFR calc non Af Amer  Date Value Ref Range Status  09/25/2019 53 (L) >59 mL/min/1.73 Final   eGFR  Date Value Ref Range Status  02/01/2022 42 (L) >59 mL/min/1.73 Final         Failed - Cr in normal range and within 360 days  Creatinine, Ser  Date Value Ref Range Status  02/01/2022 1.35 (H) 0.57 - 1.00 mg/dL Final         Passed - Last BP in normal range    BP Readings from Last 1 Encounters:  11/02/22 116/78         Passed - Valid encounter within last 6 months    Recent Outpatient Visits           4 weeks ago Encounter for weight management   Primary Care at [facility], MD   2 months ago Essential hypertension   Primary Care at [facility], MD   5 months ago Essential hypertension   Primary Care at [facility], MD   8 months ago Essential hypertension   Primary Care at [facility], MD   10 months ago Annual physical exam   Primary Care at [facility], MD       Future Appointments             In 1 week Dorna Mai, MD Primary Care at [facility] - Weight completed in the last 3 months    Wt Readings from Last 1 Encounters:  11/02/22 182 lb 3.2 oz (82.6 kg)

## 2022-12-01 ENCOUNTER — Ambulatory Visit: Payer: Medicare Other | Admitting: Family Medicine

## 2022-12-07 ENCOUNTER — Ambulatory Visit (INDEPENDENT_AMBULATORY_CARE_PROVIDER_SITE_OTHER): Payer: 59 | Admitting: Family Medicine

## 2022-12-07 ENCOUNTER — Encounter: Payer: Self-pay | Admitting: Family Medicine

## 2022-12-07 VITALS — BP 112/76 | HR 70 | Temp 98.1°F | Resp 16 | Wt 177.0 lb

## 2022-12-07 DIAGNOSIS — Z6831 Body mass index (BMI) 31.0-31.9, adult: Secondary | ICD-10-CM

## 2022-12-07 DIAGNOSIS — Z7689 Persons encountering health services in other specified circumstances: Secondary | ICD-10-CM | POA: Diagnosis not present

## 2022-12-07 DIAGNOSIS — E6609 Other obesity due to excess calories: Secondary | ICD-10-CM | POA: Diagnosis not present

## 2022-12-07 MED ORDER — PHENTERMINE HCL 37.5 MG PO CAPS: 37.5000 mg | ORAL_CAPSULE | ORAL | 0 refills | Status: DC

## 2022-12-07 NOTE — Progress Notes (Unsigned)
Patient is here for their 3 month follow-up Patient has no concerns today Care gaps have been discussed with patient  

## 2022-12-08 ENCOUNTER — Encounter: Payer: Self-pay | Admitting: Family Medicine

## 2022-12-08 NOTE — Progress Notes (Signed)
Established Patient Office Visit  Subjective    Patient ID: Barbara Adkins, female    DOB: 1951-03-26  Age: 72 y.o. MRN: [id number]  CC: No chief complaint on file.   HPI [employer] presents for routine weight management.    Outpatient Encounter Medications as of 12/07/2022  Medication Sig   amLODipine (NORVASC) 10 MG tablet Take 1 tablet (10 mg total) by mouth daily.   atorvastatin (LIPITOR) 10 MG tablet Atorvastatin Calcium   atorvastatin (LIPITOR) 20 MG tablet TAKE 1 TABLET BY MOUTH ONCE  DAILY   budesonide-formoterol (SYMBICORT) 80-4.5 MCG/ACT inhaler Symbicort   budesonide-formoterol (SYMBICORT) 80-4.5 MCG/ACT inhaler Inhale 2 puffs into the lungs in the morning and at bedtime.   cetirizine (ZYRTEC) 10 MG tablet Take 1 tablet (10 mg total) by mouth daily.   fluticasone (FLONASE ALLERGY RELIEF) 50 MCG/ACT nasal spray Flonase   fluticasone (FLONASE) 50 MCG/ACT nasal spray Place 2 sprays into both nostrils daily.   fluticasone (FLONASE) 50 MCG/ACT nasal spray Place 2 sprays into both nostrils daily.   hydrochlorothiazide (HYDRODIURIL) 25 MG tablet Take 1 tablet (25 mg total) by mouth daily.   influenza vaccine adjuvanted (FLUAD QUADRIVALENT) 0.5 ML injection Inject into the muscle.   ketorolac (ACULAR) 0.5 % ophthalmic solution Place 1 drop into the left eye 4 (four) times daily.   latanoprost (XALATAN) 0.005 % ophthalmic solution Place 1 drop into both eyes nightly.   loratadine (CLARITIN) 5 MG chewable tablet Claritin   losartan (COZAAR) 100 MG tablet TAKE 1 TABLET BY MOUTH EVERY DAY   meloxicam (MOBIC) 7.5 MG tablet TAKE 1 TABLET BY MOUTH DAILY   metFORMIN (GLUCOPHAGE) 500 MG tablet TAKE 1 TABLET BY MOUTH DAILY  WITH BREAKFAST   molnupiravir EUA (LAGEVRIO) 200 MG CAPS capsule 4 capsules   montelukast (SINGULAIR) 10 MG tablet TAKE 1 TABLET BY MOUTH AT  BEDTIME   omeprazole (PRILOSEC) 20 MG capsule Take 1 capsule (20 mg total) by mouth daily.   prednisoLONE  acetate (PRED FORTE) 1 % ophthalmic suspension Place 1 drop into the left eye 4 (four) times daily. Start using day after surgery as instructed   Timolol-Dorzolamid-Latanoprost 0.5-0.15-0.005 % SOLN Apply to eye.   [DISCONTINUED] phentermine 37.5 MG capsule Take 1 capsule (37.5 mg total) by mouth every morning.   acetaminophen (TYLENOL) 325 MG tablet Take 2 tablets (650 mg total) by mouth every 6 (six) hours. (Patient not taking: Reported on 03/25/2020)   albuterol (VENTOLIN HFA) 108 (90 Base) MCG/ACT inhaler Inhale 2 puffs into the lungs every 4 (four) hours. (Patient not taking: Reported on 02/01/2022)   Blood Pressure Monitoring (BLOOD PRESSURE KIT) DEVI 1 Device by Does not apply route 3 (three) times a week. Use to check blood pressure three times a week. ICD10 I10 (Patient not taking: Reported on 02/01/2022)   influenza vaccine adjuvanted (FLUAD) 0.5 ML injection Inject 0.5 mLs into the muscle. (Patient not taking: Reported on 02/01/2022)   latanoprost (XALATAN) 0.005 % ophthalmic solution Place 1 drop into both eyes nightly. (Patient not taking: Reported on 02/01/2022)   phentermine 37.5 MG capsule Take 1 capsule (37.5 mg total) by mouth every morning.   [DISCONTINUED] losartan (COZAAR) 25 MG tablet Losartan Potassium   [DISCONTINUED] Meloxicam 5 MG CAPS Meloxicam   [DISCONTINUED] METFORMIN & DIET MANAGE PROD PO    [DISCONTINUED] METFORMIN & DIET MANAGE PROD PO Metformin & Diet Manage Prod   [DISCONTINUED] omeprazole (KONVOMEP) 2 mg/mL SUSP oral suspension Omeprazole   [DISCONTINUED] omeprazole (PRILOSEC) 10  MG capsule TAKE 2 CAPSULES BY MOUTH DAILY   No facility-administered encounter medications on file as of 12/07/2022.    Past Medical History:  Diagnosis Date   Allergy    Arthritis    Deviated septum    Diabetes mellitus without complication (HCC)    GERD (gastroesophageal reflux disease)    Hypertension     Past Surgical History:  Procedure Laterality Date   ETHMOIDECTOMY Right  05/17/2018   Procedure: RIGHT ETHMOIDECTOMY WITH TISSUE REMOVAL;  Surgeon: Leta Baptist, MD;  Location: [place];  Service: ENT;  Laterality: Right;   FRONTAL SINUS EXPLORATION Right 05/17/2018   Procedure: RIGHT FRONTAL RECESS EXPLORATION;  Surgeon: Leta Baptist, MD;  Location: [place];  Service: ENT;  Laterality: Right;   MAXILLARY ANTROSTOMY Right 05/17/2018   Procedure: RIGHT MAXILLARY ANTROSTOMY WITH TISSUE REMOVAL;  Surgeon: Leta Baptist, MD;  Location: [place];  Service: ENT;  Laterality: Right;   NASAL SEPTOPLASTY W/ TURBINOPLASTY Bilateral 05/17/2018   Procedure: NASAL SEPTOPLASTY WITH TURBINATE REDUCTION;  Surgeon: Leta Baptist, MD;  Location: [place];  Service: ENT;  Laterality: Bilateral;   SINUS ENDO WITH FUSION N/A 05/17/2018   Procedure: SINUS ENDO WITH FUSION;  Surgeon: Leta Baptist, MD;  Location: [place];  Service: ENT;  Laterality: N/A;   SPHENOIDECTOMY Right 05/17/2018   Procedure: RIGHT SPHENOIDECTOMY WITH TISSUE REMOVAL;  Surgeon: Leta Baptist, MD;  Location: [place];  Service: ENT;  Laterality: Right;   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   Procedure: TOTAL HIP ARTHROPLASTY;  Surgeon: Garald Balding, MD;  Location: [place];  Service: Orthopedics;  Laterality: Left;   TUBAL LIGATION      Family History  Problem Relation Age of Onset   Asthma Other    Cancer Other    Hypertension Other    Diabetes Other    Colon cancer Maternal Grandmother    Breast cancer Neg Hx    Esophageal cancer Neg Hx    Rectal cancer Neg Hx    Stomach cancer Neg Hx     Social History   Socioeconomic History   Marital status: Widowed    Spouse name: Not on file   Number of children: Not on file   Years of education: Not on file   Highest education level: Not on file  Occupational History   Not on file  Tobacco Use   Smoking status: Never   Smokeless tobacco: Never  Vaping Use   Vaping Use: Never used   Substance and Sexual Activity   Alcohol use: No   Drug use: Never   Sexual activity: Not on file  Other Topics Concern   Not on file  Social History Narrative   Not on file   Social Determinants of Health   Financial Resource Strain: Not on file  Food Insecurity: Not on file  Transportation Needs: Not on file  Physical Activity: Not on file  Stress: Not on file  Social Connections: Not on file  Intimate Partner Violence: Not on file    Review of Systems  All other systems reviewed and are negative.       Objective    BP 112/76   Pulse 70   Temp 98.1 F (36.7 C) (Oral)   Resp 16   Wt 177 lb (80.3 kg)   SpO2 98%   BMI 31.35 kg/m   Physical Exam Vitals and nursing note reviewed.  Constitutional:  General: She is not in acute distress.    Appearance: She is obese.  Cardiovascular:     Rate and Rhythm: Normal rate and regular rhythm.  Pulmonary:     Effort: Pulmonary effort is normal.     Breath sounds: Normal breath sounds.  Abdominal:     Palpations: Abdomen is soft.     Tenderness: There is no abdominal tenderness.  Musculoskeletal:     Right lower leg: No edema.     Left lower leg: No edema.  Neurological:     General: No focal deficit present.     Mental Status: She is alert and oriented to person, place, and time.         Assessment & Plan:   1. Encounter for weight management Doing well.phentermine refilled.   2. Class 1 obesity due to excess calories with serious comorbidity and body mass index (BMI) of 31.0 to 31.9 in adult     Return in about 4 weeks (around 01/04/2023).   Becky Sax, MD

## 2022-12-27 ENCOUNTER — Other Ambulatory Visit: Payer: Self-pay | Admitting: Family Medicine

## 2023-01-04 ENCOUNTER — Ambulatory Visit (INDEPENDENT_AMBULATORY_CARE_PROVIDER_SITE_OTHER): Payer: 59 | Admitting: Family Medicine

## 2023-01-04 VITALS — BP 100/68 | HR 72 | Temp 98.1°F | Resp 16 | Wt 173.0 lb

## 2023-01-04 DIAGNOSIS — Z7689 Persons encountering health services in other specified circumstances: Secondary | ICD-10-CM | POA: Diagnosis not present

## 2023-01-04 DIAGNOSIS — K219 Gastro-esophageal reflux disease without esophagitis: Secondary | ICD-10-CM | POA: Diagnosis not present

## 2023-01-04 DIAGNOSIS — I10 Essential (primary) hypertension: Secondary | ICD-10-CM

## 2023-01-04 DIAGNOSIS — Z6830 Body mass index (BMI) 30.0-30.9, adult: Secondary | ICD-10-CM

## 2023-01-04 DIAGNOSIS — J4520 Mild intermittent asthma, uncomplicated: Secondary | ICD-10-CM | POA: Diagnosis not present

## 2023-01-04 DIAGNOSIS — E6609 Other obesity due to excess calories: Secondary | ICD-10-CM

## 2023-01-04 MED ORDER — OMEPRAZOLE 10 MG PO CPDR: 20.0000 mg | DELAYED_RELEASE_CAPSULE | Freq: Every day | ORAL | 0 refills | Status: DC

## 2023-01-04 MED ORDER — MELOXICAM 7.5 MG PO TABS: 7.5000 mg | ORAL_TABLET | Freq: Every day | ORAL | 0 refills | Status: DC

## 2023-01-04 MED ORDER — AMLODIPINE BESYLATE 10 MG PO TABS: 10.0000 mg | ORAL_TABLET | Freq: Every day | ORAL | 3 refills | Status: DC

## 2023-01-04 MED ORDER — BUDESONIDE-FORMOTEROL FUMARATE 80-4.5 MCG/ACT IN AERO: 2.0000 | INHALATION_SPRAY | Freq: Two times a day (BID) | RESPIRATORY_TRACT | 5 refills | Status: DC

## 2023-01-04 MED ORDER — PHENTERMINE HCL 37.5 MG PO CAPS: 37.5000 mg | ORAL_CAPSULE | ORAL | 0 refills | Status: DC

## 2023-01-05 ENCOUNTER — Encounter: Payer: Self-pay | Admitting: Family Medicine

## 2023-01-05 NOTE — Progress Notes (Signed)
Established Patient Office Visit  Subjective    Patient ID: Barbara Adkins, female    DOB: [birth year]  Age: 72 y.o. MRN: [id number]  CC:  Chief Complaint  Patient presents with   Weight Check    HPI [name] presents for routine weight management and follow up of chronic med issues. Patient denies acute complaints or concerns.    Outpatient Encounter Medications as of 01/04/2023  Medication Sig   atorvastatin (LIPITOR) 10 MG tablet Atorvastatin Calcium   atorvastatin (LIPITOR) 20 MG tablet TAKE 1 TABLET BY MOUTH ONCE  DAILY   budesonide-formoterol (SYMBICORT) 80-4.5 MCG/ACT inhaler Symbicort   cetirizine (ZYRTEC) 10 MG tablet Take 1 tablet (10 mg total) by mouth daily.   fluticasone (FLONASE ALLERGY RELIEF) 50 MCG/ACT nasal spray Flonase   fluticasone (FLONASE) 50 MCG/ACT nasal spray Place 2 sprays into both nostrils daily.   fluticasone (FLONASE) 50 MCG/ACT nasal spray Place 2 sprays into both nostrils daily.   hydrochlorothiazide (HYDRODIURIL) 25 MG tablet Take 1 tablet (25 mg total) by mouth daily.   influenza vaccine adjuvanted (FLUAD QUADRIVALENT) 0.5 ML injection Inject into the muscle.   influenza vaccine adjuvanted (FLUAD) 0.5 ML injection Inject 0.5 mLs into the muscle.   ketorolac (ACULAR) 0.5 % ophthalmic solution Place 1 drop into the left eye 4 (four) times daily.   latanoprost (XALATAN) 0.005 % ophthalmic solution Place 1 drop into both eyes nightly.   latanoprost (XALATAN) 0.005 % ophthalmic solution Place 1 drop into both eyes nightly.   loratadine (CLARITIN) 5 MG chewable tablet Claritin   losartan (COZAAR) 100 MG tablet TAKE 1 TABLET BY MOUTH EVERY DAY   metFORMIN (GLUCOPHAGE) 500 MG tablet TAKE 1 TABLET BY MOUTH DAILY  WITH BREAKFAST   molnupiravir EUA (LAGEVRIO) 200 MG CAPS capsule 4 capsules   montelukast (SINGULAIR) 10 MG tablet TAKE 1 TABLET BY MOUTH AT  BEDTIME   omeprazole (PRILOSEC) 20 MG capsule Take 1 capsule (20 mg total) by mouth  daily.   prednisoLONE acetate (PRED FORTE) 1 % ophthalmic suspension Place 1 drop into the left eye 4 (four) times daily. Start using day after surgery as instructed   Timolol-Dorzolamid-Latanoprost 0.5-0.15-0.005 % SOLN Apply to eye.   [DISCONTINUED] amLODipine (NORVASC) 10 MG tablet Take 1 tablet (10 mg total) by mouth daily.   [DISCONTINUED] budesonide-formoterol (SYMBICORT) 80-4.5 MCG/ACT inhaler Inhale 2 puffs into the lungs in the morning and at bedtime.   [DISCONTINUED] meloxicam (MOBIC) 7.5 MG tablet TAKE 1 TABLET BY MOUTH DAILY   [DISCONTINUED] omeprazole (PRILOSEC) 10 MG capsule TAKE 2 CAPSULES BY MOUTH DAILY   [DISCONTINUED] phentermine 37.5 MG capsule Take 1 capsule (37.5 mg total) by mouth every morning.   acetaminophen (TYLENOL) 325 MG tablet Take 2 tablets (650 mg total) by mouth every 6 (six) hours. (Patient not taking: Reported on 03/25/2020)   albuterol (VENTOLIN HFA) 108 (90 Base) MCG/ACT inhaler Inhale 2 puffs into the lungs every 4 (four) hours. (Patient not taking: Reported on 02/01/2022)   amLODipine (NORVASC) 10 MG tablet Take 1 tablet (10 mg total) by mouth daily.   Blood Pressure Monitoring (BLOOD PRESSURE KIT) DEVI 1 Device by Does not apply route 3 (three) times a week. Use to check blood pressure three times a week. ICD10 I10 (Patient not taking: Reported on 02/01/2022)   budesonide-formoterol (SYMBICORT) 80-4.5 MCG/ACT inhaler Inhale 2 puffs into the lungs in the morning and at bedtime.   meloxicam (MOBIC) 7.5 MG tablet Take 1 tablet (7.5 mg total) by mouth  daily.   omeprazole (PRILOSEC) 10 MG capsule Take 2 capsules (20 mg total) by mouth daily.   phentermine 37.5 MG capsule Take 1 capsule (37.5 mg total) by mouth every morning.   No facility-administered encounter medications on file as of 01/04/2023.    Past Medical History:  Diagnosis Date   Allergy    Arthritis    Deviated septum    Diabetes mellitus without complication (HCC)    GERD (gastroesophageal reflux  disease)    Hypertension     Past Surgical History:  Procedure Laterality Date   ETHMOIDECTOMY Right 05/17/2018   Procedure: RIGHT ETHMOIDECTOMY WITH TISSUE REMOVAL;  Surgeon: Leta Baptist, MD;  Location: [place];  Service: ENT;  Laterality: Right;   FRONTAL SINUS EXPLORATION Right 05/17/2018   Procedure: RIGHT FRONTAL RECESS EXPLORATION;  Surgeon: Leta Baptist, MD;  Location: [place];  Service: ENT;  Laterality: Right;   MAXILLARY ANTROSTOMY Right 05/17/2018   Procedure: RIGHT MAXILLARY ANTROSTOMY WITH TISSUE REMOVAL;  Surgeon: Leta Baptist, MD;  Location: [place];  Service: ENT;  Laterality: Right;   NASAL SEPTOPLASTY W/ TURBINOPLASTY Bilateral 05/17/2018   Procedure: NASAL SEPTOPLASTY WITH TURBINATE REDUCTION;  Surgeon: Leta Baptist, MD;  Location: [place];  Service: ENT;  Laterality: Bilateral;   SINUS ENDO WITH FUSION N/A 05/17/2018   Procedure: SINUS ENDO WITH FUSION;  Surgeon: Leta Baptist, MD;  Location: [place];  Service: ENT;  Laterality: N/A;   SPHENOIDECTOMY Right 05/17/2018   Procedure: RIGHT SPHENOIDECTOMY WITH TISSUE REMOVAL;  Surgeon: Leta Baptist, MD;  Location: [place];  Service: ENT;  Laterality: Right;   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   Procedure: TOTAL HIP ARTHROPLASTY;  Surgeon: Garald Balding, MD;  Location: [place];  Service: Orthopedics;  Laterality: Left;   TUBAL LIGATION      Family History  Problem Relation Age of Onset   Asthma Other    Cancer Other    Hypertension Other    Diabetes Other    Colon cancer Maternal Grandmother    Breast cancer Neg Hx    Esophageal cancer Neg Hx    Rectal cancer Neg Hx    Stomach cancer Neg Hx     Social History   Socioeconomic History   Marital status: Widowed    Spouse name: Not on file   Number of children: Not on file   Years of education: Not on file   Highest education level: Not on file  Occupational History   Not on file   Tobacco Use   Smoking status: Never   Smokeless tobacco: Never  Vaping Use   Vaping Use: Never used  Substance and Sexual Activity   Alcohol use: No   Drug use: Never   Sexual activity: Not on file  Other Topics Concern   Not on file  Social History Narrative   Not on file   Social Determinants of Health   Financial Resource Strain: Not on file  Food Insecurity: Not on file  Transportation Needs: Not on file  Physical Activity: Not on file  Stress: Not on file  Social Connections: Not on file  Intimate Partner Violence: Not on file    Review of Systems  All other systems reviewed and are negative.       Objective    BP 100/68   Pulse 72   Temp 98.1 F (36.7 C) (Oral)   Resp 16   Wt 173 lb (78.5  kg)   SpO2 99%   BMI 30.65 kg/m   Physical Exam Vitals and nursing note reviewed.  Constitutional:      General: She is not in acute distress.    Appearance: She is obese.  Cardiovascular:     Rate and Rhythm: Normal rate and regular rhythm.  Pulmonary:     Effort: Pulmonary effort is normal.     Breath sounds: Normal breath sounds.  Abdominal:     Palpations: Abdomen is soft.     Tenderness: There is no abdominal tenderness.  Musculoskeletal:     Right lower leg: No edema.     Left lower leg: No edema.  Neurological:     General: No focal deficit present.     Mental Status: She is alert and oriented to person, place, and time.         Assessment & Plan:   1. Encounter for weight management Continues to do well with present management. Phentermine refilled.   2. Class 1 obesity due to excess calories with serious comorbidity and body mass index (BMI) of 30.0 to 30.9 in adult As above   3. Essential hypertension Appears stable. Continue   4. Mild intermittent asthma without complication Continue.   5. Gastroesophageal reflux disease without esophagitis Continue     Return in about 4 weeks (around 02/01/2023) for follow up.   Becky Sax, MD

## 2023-02-03 ENCOUNTER — Other Ambulatory Visit: Payer: Self-pay | Admitting: Family Medicine

## 2023-02-04 NOTE — Telephone Encounter (Signed)
Requested medication (s) are due for refill today - yes  Requested medication (s) are on the active medication list -yes  Future visit scheduled -yes  Last refill: 05/03/22 #100 2RF  Notes to clinic: fail lab protocol for refill- over 1 year- 02/01/22  Requested Prescriptions  Pending Prescriptions Disp Refills   atorvastatin (LIPITOR) 20 MG tablet [Pharmacy Med Name: Atorvastatin Calcium 20 MG Oral Tablet] 100 tablet 2    Sig: TAKE 1 TABLET BY MOUTH ONCE  DAILY     Cardiovascular:  Antilipid - Statins Failed - 02/03/2023 10:42 PM      Failed - Lipid Panel in normal range within the last 12 months    Cholesterol, Total  Date Value Ref Range Status  02/01/2022 212 (H) 100 - 199 mg/dL Final   LDL Chol Calc (NIH)  Date Value Ref Range Status  02/01/2022 117 (H) 0 - 99 mg/dL Final   HDL  Date Value Ref Range Status  02/01/2022 77 >39 mg/dL Final   Triglycerides  Date Value Ref Range Status  02/01/2022 105 0 - 149 mg/dL Final         Passed - Patient is not pregnant      Passed - Valid encounter within last 12 months    Recent Outpatient Visits           1 month ago Encounter for weight management   [place] [facility], [place]   1 month ago Encounter for weight management   [place] [facility], MD   3 months ago Encounter for weight management   [place] [facility], MD   4 months ago Essential hypertension   [place] [facility], MD   7 months ago Essential hypertension   [place] [facility] [place], [place]              Signed Prescriptions Disp Refills   montelukast (SINGULAIR) 10 MG tablet 100 tablet 2    Sig: TAKE 1 TABLET BY MOUTH AT  BEDTIME     Pulmonology:  Leukotriene Inhibitors Passed - 02/03/2023 10:42 PM      Passed - Valid encounter within last 12 months    Recent Outpatient  Visits           1 month ago Encounter for weight management   [place] [facility], MD   1 month ago Encounter for weight management   [place] [facility], MD   3 months ago Encounter for weight management   [place] [facility], MD   4 months ago Essential hypertension   [place] [facility], MD   7 months ago Essential hypertension   [place] [facility] [place], [place]                 Requested Prescriptions  Pending Prescriptions Disp Refills   atorvastatin (LIPITOR) 20 MG tablet [Pharmacy Med Name: Atorvastatin Calcium 20 MG Oral Tablet] 100 tablet 2    Sig: TAKE 1 TABLET BY MOUTH ONCE  DAILY     Cardiovascular:  Antilipid - Statins Failed - 02/03/2023 10:42 PM      Failed - Lipid Panel in normal range within the last 12 months    Cholesterol,  Total  Date Value Ref Range Status  02/01/2022 212 (H) 100 - 199 mg/dL Final   LDL Chol Calc (NIH)  Date Value Ref Range Status  02/01/2022 117 (H) 0 - 99 mg/dL Final   HDL  Date Value Ref Range Status  02/01/2022 77 >39 mg/dL Final   Triglycerides  Date Value Ref Range Status  02/01/2022 105 0 - 149 mg/dL Final         Passed - Patient is not pregnant      Passed - Valid encounter within last 12 months    Recent Outpatient Visits           1 month ago Encounter for weight management   [place] [facility], MD   1 month ago Encounter for weight management   [place] [facility], MD   3 months ago Encounter for weight management   [place] [facility], MD   4 months ago Essential hypertension   [place] [facility], MD   7 months ago Essential hypertension   [place] [facility] [place], [place]              Signed Prescriptions Disp Refills   montelukast (SINGULAIR) 10 MG tablet 100 tablet 2    Sig: TAKE 1 TABLET BY MOUTH AT  BEDTIME     Pulmonology:  Leukotriene Inhibitors Passed - 02/03/2023 10:42 PM      Passed - Valid encounter within last 12 months    Recent Outpatient Visits           1 month ago Encounter for weight management   [place] [facility], MD   1 month ago Encounter for weight management   [place] [facility], MD   3 months ago Encounter for weight management   [place] [facility], MD   4 months ago Essential hypertension   [place] [facility], MD   7 months ago Essential hypertension   [place] [facility], MD

## 2023-02-04 NOTE — Telephone Encounter (Signed)
Requested Prescriptions  Pending Prescriptions Disp Refills   atorvastatin (LIPITOR) 20 MG tablet [Pharmacy Med Name: Atorvastatin Calcium 20 MG Oral Tablet] 100 tablet 2    Sig: TAKE 1 TABLET BY MOUTH ONCE  DAILY     Cardiovascular:  Antilipid - Statins Failed - 02/03/2023 10:42 PM      Failed - Lipid Panel in normal range within the last 12 months    Cholesterol, Total  Date Value Ref Range Status  02/01/2022 212 (H) 100 - 199 mg/dL Final   LDL Chol Calc (NIH)  Date Value Ref Range Status  02/01/2022 117 (H) 0 - 99 mg/dL Final   HDL  Date Value Ref Range Status  02/01/2022 77 >39 mg/dL Final   Triglycerides  Date Value Ref Range Status  02/01/2022 105 0 - 149 mg/dL Final         Passed - Patient is not pregnant      Passed - Valid encounter within last 12 months    Recent Outpatient Visits           1 month ago Encounter for weight management   [place] [facility], MD   1 month ago Encounter for weight management   [place] [facility], MD   3 months ago Encounter for weight management   [place] [facility], MD   4 months ago Essential hypertension   [place] [facility], MD   7 months ago Essential hypertension   [place] [facility] [place], [place]               montelukast (SINGULAIR) 10 MG tablet [Pharmacy Med Name: Montelukast Sodium 10 MG Oral Tablet] 100 tablet 2    Sig: TAKE 1 TABLET BY MOUTH AT  BEDTIME     Pulmonology:  Leukotriene Inhibitors Passed - 02/03/2023 10:42 PM      Passed - Valid encounter within last 12 months    Recent Outpatient Visits           1 month ago Encounter for weight management   [place] [facility], MD   1 month ago Encounter for weight management   [place] [facility], MD    3 months ago Encounter for weight management   [place] [facility], MD   4 months ago Essential hypertension   [place] [facility], MD   7 months ago Essential hypertension   [place] [facility], MD

## 2023-02-11 ENCOUNTER — Encounter: Payer: Self-pay | Admitting: Family Medicine

## 2023-02-11 ENCOUNTER — Ambulatory Visit (INDEPENDENT_AMBULATORY_CARE_PROVIDER_SITE_OTHER): Payer: 59 | Admitting: Family Medicine

## 2023-02-11 VITALS — Resp 16 | Ht 63.0 in | Wt 171.6 lb

## 2023-02-11 DIAGNOSIS — Z130 Encounter for screening for diseases of the blood and blood-forming organs and certain disorders involving the immune mechanism: Secondary | ICD-10-CM | POA: Diagnosis not present

## 2023-02-11 DIAGNOSIS — Z0000 Encounter for general adult medical examination without abnormal findings: Secondary | ICD-10-CM

## 2023-02-11 DIAGNOSIS — Z13228 Encounter for screening for other metabolic disorders: Secondary | ICD-10-CM | POA: Diagnosis not present

## 2023-02-11 DIAGNOSIS — Z13220 Encounter for screening for lipoid disorders: Secondary | ICD-10-CM

## 2023-02-11 DIAGNOSIS — E119 Type 2 diabetes mellitus without complications: Secondary | ICD-10-CM | POA: Diagnosis not present

## 2023-02-11 DIAGNOSIS — Z7689 Persons encountering health services in other specified circumstances: Secondary | ICD-10-CM

## 2023-02-11 DIAGNOSIS — Z1329 Encounter for screening for other suspected endocrine disorder: Secondary | ICD-10-CM | POA: Diagnosis not present

## 2023-02-11 DIAGNOSIS — Z0001 Encounter for general adult medical examination with abnormal findings: Secondary | ICD-10-CM

## 2023-02-11 MED ORDER — PHENTERMINE HCL 37.5 MG PO CAPS: 37.5000 mg | ORAL_CAPSULE | ORAL | 0 refills | Status: DC

## 2023-02-11 NOTE — Progress Notes (Signed)
Established Patient Office Visit  Subjective    Patient ID: Barbara Adkins, female    DOB: May 06, 1951  Age: 72 y.o. MRN: [id number]  CC:  Chief Complaint  Patient presents with   Medicare Wellness   Annual Exam    HPI Barbara Adkins presents for routine annual check up as well as weight management.Patient denies acute complaints or concerns.    Outpatient Encounter Medications as of 02/11/2023  Medication Sig   amLODipine (NORVASC) 10 MG tablet Take 1 tablet (10 mg total) by mouth daily.   atorvastatin (LIPITOR) 10 MG tablet Atorvastatin Calcium   atorvastatin (LIPITOR) 20 MG tablet TAKE 1 TABLET BY MOUTH ONCE  DAILY   budesonide-formoterol (SYMBICORT) 80-4.5 MCG/ACT inhaler Symbicort   budesonide-formoterol (SYMBICORT) 80-4.5 MCG/ACT inhaler Inhale 2 puffs into the lungs in the morning and at bedtime.   cetirizine (ZYRTEC) 10 MG tablet Take 1 tablet (10 mg total) by mouth daily.   fluticasone (FLONASE ALLERGY RELIEF) 50 MCG/ACT nasal spray Flonase   fluticasone (FLONASE) 50 MCG/ACT nasal spray Place 2 sprays into both nostrils daily.   fluticasone (FLONASE) 50 MCG/ACT nasal spray Place 2 sprays into both nostrils daily.   hydrochlorothiazide (HYDRODIURIL) 25 MG tablet Take 1 tablet (25 mg total) by mouth daily.   influenza vaccine adjuvanted (FLUAD QUADRIVALENT) 0.5 ML injection Inject into the muscle.   influenza vaccine adjuvanted (FLUAD) 0.5 ML injection Inject 0.5 mLs into the muscle.   ketorolac (ACULAR) 0.5 % ophthalmic solution Place 1 drop into the left eye 4 (four) times daily.   latanoprost (XALATAN) 0.005 % ophthalmic solution Place 1 drop into both eyes nightly.   latanoprost (XALATAN) 0.005 % ophthalmic solution Place 1 drop into both eyes nightly.   loratadine (CLARITIN) 5 MG chewable tablet Claritin   losartan (COZAAR) 100 MG tablet TAKE 1 TABLET BY MOUTH EVERY DAY   meloxicam (MOBIC) 7.5 MG tablet Take 1 tablet (7.5 mg total) by mouth daily.    metFORMIN (GLUCOPHAGE) 500 MG tablet TAKE 1 TABLET BY MOUTH DAILY  WITH BREAKFAST   molnupiravir EUA (LAGEVRIO) 200 MG CAPS capsule 4 capsules   montelukast (SINGULAIR) 10 MG tablet TAKE 1 TABLET BY MOUTH AT  BEDTIME   omeprazole (PRILOSEC) 10 MG capsule Take 2 capsules (20 mg total) by mouth daily.   omeprazole (PRILOSEC) 20 MG capsule Take 1 capsule (20 mg total) by mouth daily.   prednisoLONE acetate (PRED FORTE) 1 % ophthalmic suspension Place 1 drop into the left eye 4 (four) times daily. Start using day after surgery as instructed   Timolol-Dorzolamid-Latanoprost 0.5-0.15-0.005 % SOLN Apply to eye.   [DISCONTINUED] phentermine 37.5 MG capsule Take 1 capsule (37.5 mg total) by mouth every morning.   acetaminophen (TYLENOL) 325 MG tablet Take 2 tablets (650 mg total) by mouth every 6 (six) hours. (Patient not taking: Reported on 03/25/2020)   albuterol (VENTOLIN HFA) 108 (90 Base) MCG/ACT inhaler Inhale 2 puffs into the lungs every 4 (four) hours. (Patient not taking: Reported on 02/01/2022)   Blood Pressure Monitoring (BLOOD PRESSURE KIT) DEVI 1 Device by Does not apply route 3 (three) times a week. Use to check blood pressure three times a week. ICD10 I10 (Patient not taking: Reported on 02/01/2022)   phentermine 37.5 MG capsule Take 1 capsule (37.5 mg total) by mouth every morning.   No facility-administered encounter medications on file as of 02/11/2023.    Past Medical History:  Diagnosis Date   Allergy    Arthritis  Deviated septum    Diabetes mellitus without complication (HCC)    GERD (gastroesophageal reflux disease)    Hypertension     Past Surgical History:  Procedure Laterality Date   ETHMOIDECTOMY Right 05/17/2018   Procedure: RIGHT ETHMOIDECTOMY WITH TISSUE REMOVAL;  Surgeon: Leta Baptist, MD;  Location: [place];  Service: ENT;  Laterality: Right;   FRONTAL SINUS EXPLORATION Right 05/17/2018   Procedure: RIGHT FRONTAL RECESS EXPLORATION;  Surgeon: Leta Baptist,  MD;  Location: [place];  Service: ENT;  Laterality: Right;   MAXILLARY ANTROSTOMY Right 05/17/2018   Procedure: RIGHT MAXILLARY ANTROSTOMY WITH TISSUE REMOVAL;  Surgeon: Leta Baptist, MD;  Location: [place];  Service: ENT;  Laterality: Right;   NASAL SEPTOPLASTY W/ TURBINOPLASTY Bilateral 05/17/2018   Procedure: NASAL SEPTOPLASTY WITH TURBINATE REDUCTION;  Surgeon: Leta Baptist, MD;  Location: [place];  Service: ENT;  Laterality: Bilateral;   SINUS ENDO WITH FUSION N/A 05/17/2018   Procedure: SINUS ENDO WITH FUSION;  Surgeon: Leta Baptist, MD;  Location: [place];  Service: ENT;  Laterality: N/A;   SPHENOIDECTOMY Right 05/17/2018   Procedure: RIGHT SPHENOIDECTOMY WITH TISSUE REMOVAL;  Surgeon: Leta Baptist, MD;  Location: [place];  Service: ENT;  Laterality: Right;   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   Procedure: TOTAL HIP ARTHROPLASTY;  Surgeon: Garald Balding, MD;  Location: [place];  Service: Orthopedics;  Laterality: Left;   TUBAL LIGATION      Family History  Problem Relation Age of Onset   Asthma Other    Cancer Other    Hypertension Other    Diabetes Other    Colon cancer Maternal Grandmother    Breast cancer Neg Hx    Esophageal cancer Neg Hx    Rectal cancer Neg Hx    Stomach cancer Neg Hx     Social History   Socioeconomic History   Marital status: Widowed    Spouse name: Not on file   Number of children: Not on file   Years of education: Not on file   Highest education level: Not on file  Occupational History   Not on file  Tobacco Use   Smoking status: Never   Smokeless tobacco: Never  Vaping Use   Vaping Use: Never used  Substance and Sexual Activity   Alcohol use: No   Drug use: Never   Sexual activity: Not on file  Other Topics Concern   Not on file  Social History Narrative   Not on file   Social Determinants of Health   Financial Resource Strain: Low Risk  (02/11/2023)    Overall Financial Resource Strain (CARDIA)    Difficulty of Paying Living Expenses: Not very hard  Food Insecurity: No Food Insecurity (02/11/2023)   Hunger Vital Sign    Worried About Running Out of Food in the Last Year: Never true    Ran Out of Food in the Last Year: Never true  Transportation Needs: No Transportation Needs (02/11/2023)   PRAPARE - Hydrologist (Medical): No    Lack of Transportation (Non-Medical): No  Physical Activity: Sufficiently Active (02/11/2023)   Exercise Vital Sign    Days of Exercise per Week: 7 days    Minutes of Exercise per Session: 30 min  Stress: No Stress Concern Present (02/11/2023)   [place]    Feeling of Stress : Not at all  Social Connections: Moderately Integrated (02/11/2023)   Social Connection and Isolation Panel [NHANES]    Frequency of Communication with Friends and Family: More than three times a week    Frequency of Social Gatherings with Friends and Family: More than three times a week    Attends Religious Services: More than 4 times per year    Active Member of [employer] or Organizations: Yes    Attends Archivist Meetings: More than 4 times per year    Marital Status: Widowed  Intimate Partner Violence: Unknown (02/11/2023)   Humiliation, Afraid, Rape, and Kick questionnaire    Fear of Current or Ex-Partner: No    Emotionally Abused: No    Physically Abused: Not on file    Sexually Abused: No    Review of Systems  All other systems reviewed and are negative.       Objective    Resp 16   Ht 5\' 3"  (1.6 m)   Wt 171 lb 9.6 oz (77.8 kg)   BMI 30.40 kg/m   Physical Exam Vitals and nursing note reviewed.  Constitutional:      General: She is not in acute distress.    Appearance: She is obese.  Cardiovascular:     Rate and Rhythm: Normal rate and regular rhythm.  Pulmonary:     Effort: Pulmonary effort is normal.      Breath sounds: Normal breath sounds.  Abdominal:     Palpations: Abdomen is soft.     Tenderness: There is no abdominal tenderness.  Musculoskeletal:     Right lower leg: No edema.     Left lower leg: No edema.  Neurological:     General: No focal deficit present.     Mental Status: She is alert and oriented to person, place, and time.     Deep Tendon Reflexes: Abnormal reflex: .diagmed.         Assessment & Plan:    1. Encounter for Medicare annual wellness exam  - CMP14+EGFR  2. Screening for deficiency anemia  - CBC with Differential  3. Screening for endocrine/metabolic/immunity disorders  - Hemoglobin A1c - TSH  4. Screening for lipid disorders  - Lipid Panel  5. Type 2 diabetes mellitus without complication, without long-term current use of insulin (HCC)  - Microalbumin / creatinine urine ratio  6. Encounter for weight management Phentermine refilled.    Return in 1 year (on 02/11/2024).   Becky Sax, MD

## 2023-02-12 LAB — CMP14+EGFR
ALT: 8 IU/L (ref 0–32)
AST: 20 IU/L (ref 0–40)
Albumin/Globulin Ratio: 1.2 (ref 1.2–2.2)
Albumin: 4.1 g/dL (ref 3.8–4.8)
Alkaline Phosphatase: 86 IU/L (ref 44–121)
BUN/Creatinine Ratio: 16 (ref 12–28)
BUN: 23 mg/dL (ref 8–27)
Bilirubin Total: 0.5 mg/dL (ref 0.0–1.2)
CO2: 23 mmol/L (ref 20–29)
Calcium: 9.3 mg/dL (ref 8.7–10.3)
Chloride: 103 mmol/L (ref 96–106)
Creatinine, Ser: 1.48 mg/dL — ABNORMAL HIGH (ref 0.57–1.00)
Globulin, Total: 3.5 g/dL (ref 1.5–4.5)
Glucose: 87 mg/dL (ref 70–99)
Potassium: 4.3 mmol/L (ref 3.5–5.2)
Sodium: 142 mmol/L (ref 134–144)
Total Protein: 7.6 g/dL (ref 6.0–8.5)
eGFR: 38 mL/min/{1.73_m2} — ABNORMAL LOW (ref >59)

## 2023-02-12 LAB — CBC WITH DIFFERENTIAL/PLATELET
Basophils Absolute: 0.1 10*3/uL (ref 0.0–0.2)
Basos: 1 %
EOS (ABSOLUTE): 0.3 10*3/uL (ref 0.0–0.4)
Eos: 4 %
Hematocrit: 36.8 % (ref 34.0–46.6)
Hemoglobin: 11.1 g/dL (ref 11.1–15.9)
Immature Grans (Abs): 0 10*3/uL (ref 0.0–0.1)
Immature Granulocytes: 0 %
Lymphocytes Absolute: 2.4 10*3/uL (ref 0.7–3.1)
Lymphs: 34 %
MCH: 21.9 pg — ABNORMAL LOW (ref 26.6–33.0)
MCHC: 30.2 g/dL — ABNORMAL LOW (ref 31.5–35.7)
MCV: 73 fL — ABNORMAL LOW (ref 79–97)
Monocytes Absolute: 0.5 10*3/uL (ref 0.1–0.9)
Monocytes: 7 %
Neutrophils Absolute: 3.7 10*3/uL (ref 1.4–7.0)
Neutrophils: 54 %
Platelets: 381 10*3/uL (ref 150–450)
RBC: 5.06 x10E6/uL (ref 3.77–5.28)
RDW: 14.4 % (ref 11.7–15.4)
WBC: 7 10*3/uL (ref 3.4–10.8)

## 2023-02-12 LAB — LIPID PANEL
Chol/HDL Ratio: 2.8 ratio (ref 0.0–4.4)
Cholesterol, Total: 197 mg/dL (ref 100–199)
HDL: 70 mg/dL (ref >39)
LDL Chol Calc (NIH): 111 mg/dL — ABNORMAL HIGH (ref 0–99)
Triglycerides: 92 mg/dL (ref 0–149)
VLDL Cholesterol Cal: 16 mg/dL (ref 5–40)

## 2023-02-12 LAB — MICROALBUMIN / CREATININE URINE RATIO
Creatinine, Urine: 120.9 mg/dL
Microalb/Creat Ratio: 73 mg/g creat — ABNORMAL HIGH (ref 0–29)
Microalbumin, Urine: 87.7 ug/mL

## 2023-02-12 LAB — HEMOGLOBIN A1C
Est. average glucose Bld gHb Est-mCnc: 128 mg/dL
Hgb A1c MFr Bld: 6.1 % — ABNORMAL HIGH (ref 4.8–5.6)

## 2023-02-12 LAB — TSH: TSH: 1.33 u[IU]/mL (ref 0.450–4.500)

## 2023-02-14 ENCOUNTER — Other Ambulatory Visit: Payer: Self-pay | Admitting: Family Medicine

## 2023-02-15 NOTE — Progress Notes (Signed)
Annual Wellness Visit     Patient: Barbara Adkins, Female    DOB: 11-13-1951, 72 y.o.   MRN: [id number]  Subjective  Chief Complaint  Patient presents with   Medicare Wellness   Annual Exam    Barbara Adkins is a 72 y.o. female who presents today for her Annual Wellness Visit. She reports consuming a diabetic,   calorie diet. Home exercise routine includes walking   hrs per day. She generally feels fairly well. She reports sleeping fairly well. She does not have additional problems to discuss today.   HPI     Patient Active Problem List   Diagnosis Date Noted   Family history of glaucoma 05/24/2021   Fuchs' corneal dystrophy of both eyes 05/24/2021   At high risk for open angle glaucoma of left eye 05/21/2021   Nuclear sclerotic cataract of left eye 05/21/2021   Primary open angle glaucoma (POAG) of right eye, mild stage 05/21/2021   Bilateral primary osteoarthritis of knee 07/17/2019   Bilateral dry eyes 05/07/2019   Retinal hole of left eye 05/07/2019   Unilateral primary osteoarthritis, left knee 02/06/2019   Status post left hip replacement 11/28/2018   Unilateral primary osteoarthritis, left hip 11/21/2018   Weight gain 04/17/2018   Dyslipidemia 04/17/2018   Prediabetes 04/17/2018   Bronchospasm 04/17/2018   Essential hypertension 03/20/2018   Class 1 obesity due to excess calories with serious comorbidity and body mass index (BMI) of 34.0 to 34.9 in adult 03/20/2018   Acute pain of left knee 03/20/2018   Past Medical History:  Diagnosis Date   Allergy    Arthritis    Deviated septum    Diabetes mellitus without complication (HCC)    GERD (gastroesophageal reflux disease)    Hypertension    Past Surgical History:  Procedure Laterality Date   ETHMOIDECTOMY Right 05/17/2018   Procedure: RIGHT ETHMOIDECTOMY WITH TISSUE REMOVAL;  Surgeon: Leta Baptist, MD;  Location: [place];  Service: ENT;  Laterality: Right;   FRONTAL SINUS  EXPLORATION Right 05/17/2018   Procedure: RIGHT FRONTAL RECESS EXPLORATION;  Surgeon: Leta Baptist, MD;  Location: [place];  Service: ENT;  Laterality: Right;   MAXILLARY ANTROSTOMY Right 05/17/2018   Procedure: RIGHT MAXILLARY ANTROSTOMY WITH TISSUE REMOVAL;  Surgeon: Leta Baptist, MD;  Location: [place];  Service: ENT;  Laterality: Right;   NASAL SEPTOPLASTY W/ TURBINOPLASTY Bilateral 05/17/2018   Procedure: NASAL SEPTOPLASTY WITH TURBINATE REDUCTION;  Surgeon: Leta Baptist, MD;  Location: [place];  Service: ENT;  Laterality: Bilateral;   SINUS ENDO WITH FUSION N/A 05/17/2018   Procedure: SINUS ENDO WITH FUSION;  Surgeon: Leta Baptist, MD;  Location: [place];  Service: ENT;  Laterality: N/A;   SPHENOIDECTOMY Right 05/17/2018   Procedure: RIGHT SPHENOIDECTOMY WITH TISSUE REMOVAL;  Surgeon: Leta Baptist, MD;  Location: [place];  Service: ENT;  Laterality: Right;   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   Procedure: TOTAL HIP ARTHROPLASTY;  Surgeon: Garald Balding, MD;  Location: [place];  Service: Orthopedics;  Laterality: Left;   TUBAL LIGATION     Social History   Tobacco Use   Smoking status: Never   Smokeless tobacco: Never  Vaping Use   Vaping Use: Never used  Substance Use Topics   Alcohol use: No   Drug use: Never   Social History   Socioeconomic History   Marital status: Widowed    Spouse name: Not on file  Number of children: Not on file   Years of education: Not on file   Highest education level: Not on file  Occupational History   Not on file  Tobacco Use   Smoking status: Never   Smokeless tobacco: Never  Vaping Use   Vaping Use: Never used  Substance and Sexual Activity   Alcohol use: No   Drug use: Never   Sexual activity: Not on file  Other Topics Concern   Not on file  Social History Narrative   Not on file   Social Determinants of Health   Financial Resource Strain: Low Risk  (02/11/2023)    Overall Financial Resource Strain (CARDIA)    Difficulty of Paying Living Expenses: Not very hard  Food Insecurity: No Food Insecurity (02/11/2023)   Hunger Vital Sign    Worried About Running Out of Food in the Last Year: Never true    Ran Out of Food in the Last Year: Never true  Transportation Needs: No Transportation Needs (02/11/2023)   PRAPARE - Hydrologist (Medical): No    Lack of Transportation (Non-Medical): No  Physical Activity: Sufficiently Active (02/11/2023)   Exercise Vital Sign    Days of Exercise per Week: 7 days    Minutes of Exercise per Session: 30 min  Stress: No Stress Concern Present (02/11/2023)   [place]    Feeling of Stress : Not at all  Social Connections: Moderately Integrated (02/11/2023)   Social Connection and Isolation Panel [NHANES]    Frequency of Communication with Friends and Family: More than three times a week    Frequency of Social Gatherings with Friends and Family: More than three times a week    Attends Religious Services: More than 4 times per year    Active Member of [employer] or Organizations: Yes    Attends Archivist Meetings: More than 4 times per year    Marital Status: Widowed  Intimate Partner Violence: Unknown (02/11/2023)   Humiliation, Afraid, Rape, and Kick questionnaire    Fear of Current or Ex-Partner: No    Emotionally Abused: No    Physically Abused: Not on file    Sexually Abused: No   Family Status  Relation Name Status   Mother  Deceased   Father  Deceased   Other  (Not Specified)   MGM  Deceased   Sister  Alive   Brother  Alive   Brother  Deceased   Brother  Deceased   Neg Hx  (Not Specified)   Family History  Problem Relation Age of Onset   Asthma Other    Cancer Other    Hypertension Other    Diabetes Other    Colon cancer Maternal Grandmother    Breast cancer Neg Hx    Esophageal cancer Neg Hx     Rectal cancer Neg Hx    Stomach cancer Neg Hx    No Known Allergies    Medications: Outpatient Medications Prior to Visit  Medication Sig   amLODipine (NORVASC) 10 MG tablet Take 1 tablet (10 mg total) by mouth daily.   atorvastatin (LIPITOR) 10 MG tablet Atorvastatin Calcium   atorvastatin (LIPITOR) 20 MG tablet TAKE 1 TABLET BY MOUTH ONCE  DAILY   budesonide-formoterol (SYMBICORT) 80-4.5 MCG/ACT inhaler Symbicort   budesonide-formoterol (SYMBICORT) 80-4.5 MCG/ACT inhaler Inhale 2 puffs into the lungs in the morning and at bedtime.   cetirizine (ZYRTEC) 10 MG tablet Take 1  tablet (10 mg total) by mouth daily.   fluticasone (FLONASE ALLERGY RELIEF) 50 MCG/ACT nasal spray Flonase   fluticasone (FLONASE) 50 MCG/ACT nasal spray Place 2 sprays into both nostrils daily.   fluticasone (FLONASE) 50 MCG/ACT nasal spray Place 2 sprays into both nostrils daily.   hydrochlorothiazide (HYDRODIURIL) 25 MG tablet Take 1 tablet (25 mg total) by mouth daily.   influenza vaccine adjuvanted (FLUAD QUADRIVALENT) 0.5 ML injection Inject into the muscle.   influenza vaccine adjuvanted (FLUAD) 0.5 ML injection Inject 0.5 mLs into the muscle.   ketorolac (ACULAR) 0.5 % ophthalmic solution Place 1 drop into the left eye 4 (four) times daily.   latanoprost (XALATAN) 0.005 % ophthalmic solution Place 1 drop into both eyes nightly.   latanoprost (XALATAN) 0.005 % ophthalmic solution Place 1 drop into both eyes nightly.   loratadine (CLARITIN) 5 MG chewable tablet Claritin   losartan (COZAAR) 100 MG tablet TAKE 1 TABLET BY MOUTH EVERY DAY   meloxicam (MOBIC) 7.5 MG tablet Take 1 tablet (7.5 mg total) by mouth daily.   metFORMIN (GLUCOPHAGE) 500 MG tablet TAKE 1 TABLET BY MOUTH DAILY  WITH BREAKFAST   molnupiravir EUA (LAGEVRIO) 200 MG CAPS capsule 4 capsules   montelukast (SINGULAIR) 10 MG tablet TAKE 1 TABLET BY MOUTH AT  BEDTIME   omeprazole (PRILOSEC) 10 MG capsule Take 2 capsules (20 mg total) by mouth daily.    omeprazole (PRILOSEC) 20 MG capsule Take 1 capsule (20 mg total) by mouth daily.   prednisoLONE acetate (PRED FORTE) 1 % ophthalmic suspension Place 1 drop into the left eye 4 (four) times daily. Start using day after surgery as instructed   Timolol-Dorzolamid-Latanoprost 0.5-0.15-0.005 % SOLN Apply to eye.   [DISCONTINUED] phentermine 37.5 MG capsule Take 1 capsule (37.5 mg total) by mouth every morning.   acetaminophen (TYLENOL) 325 MG tablet Take 2 tablets (650 mg total) by mouth every 6 (six) hours. (Patient not taking: Reported on 03/25/2020)   albuterol (VENTOLIN HFA) 108 (90 Base) MCG/ACT inhaler Inhale 2 puffs into the lungs every 4 (four) hours. (Patient not taking: Reported on 02/01/2022)   Blood Pressure Monitoring (BLOOD PRESSURE KIT) DEVI 1 Device by Does not apply route 3 (three) times a week. Use to check blood pressure three times a week. ICD10 I10 (Patient not taking: Reported on 02/01/2022)   No facility-administered medications prior to visit.    No Known Allergies  Patient Care Team: Dorna Mai, MD as PCP - General (Family Medicine) Garald Balding, MD (Inactive) as Consulting Physician (Orthopedic Surgery)  ROS      Objective  Resp 16   Ht 5\' 3"  (1.6 m)   Wt 171 lb 9.6 oz (77.8 kg)   BMI 30.40 kg/m  BP Readings from Last 3 Encounters:  01/04/23 100/68  12/07/22 112/76  11/02/22 116/78      Physical Exam    Most recent functional status assessment:    02/11/2023   10:44 AM  In your present state of health, do you have any difficulty performing the following activities:  Hearing? 0  Vision? 0  Difficulty concentrating or making decisions? 0  Walking or climbing stairs? 0  Dressing or bathing? 0  Doing errands, shopping? 0  Preparing Food and eating ? Y  Using the Toilet? Y  In the past six months, have you accidently leaked urine? Y  Do you have problems with loss of bowel control? N  Managing your Medications? N  Managing your Finances?  N  Housekeeping  or managing your Housekeeping? N   Most recent fall risk assessment:    02/11/2023   10:43 AM  Fall Risk   Falls in the past year? 0  Number falls in past yr: 0  Injury with Fall? 0  Risk for fall due to : No Fall Risks    Most recent depression screenings:    02/11/2023   10:44 AM 09/27/2022   10:24 AM  PHQ 2/9 Scores  PHQ - 2 Score 0 0  PHQ- 9 Score  1   Most recent cognitive screening:    02/11/2023   10:45 AM  6CIT Screen  What Year? 4 points  What month? 3 points  What time? 3 points  Count back from 20 4 points  Months in reverse 4 points  Repeat phrase 10 points  Total Score 28 points   Most recent Audit-C alcohol use screening    02/11/2023   10:42 AM  Alcohol Use Disorder Test (AUDIT)  1. How often do you have a drink containing alcohol? 0  2. How many drinks containing alcohol do you have on a typical day when you are drinking? 0  3. How often do you have six or more drinks on one occasion? 0  AUDIT-C Score 0   A score of 3 or more in women, and 4 or more in men indicates increased risk for alcohol abuse, EXCEPT if all of the points are from question 1   Vision/Hearing Screen: No results found.  Last CBC Lab Results  Component Value Date   WBC 7.0 02/11/2023   HGB 11.1 02/11/2023   HCT 36.8 02/11/2023   MCV 73 (L) 02/11/2023   MCH 21.9 (L) 02/11/2023   RDW 14.4 02/11/2023   PLT 381 0000000   Last metabolic panel Lab Results  Component Value Date   GLUCOSE 87 02/11/2023   NA 142 02/11/2023   K 4.3 02/11/2023   CL 103 02/11/2023   CO2 23 02/11/2023   BUN 23 02/11/2023   CREATININE 1.48 (H) 02/11/2023   EGFR 38 (L) 02/11/2023   CALCIUM 9.3 02/11/2023   PROT 7.6 02/11/2023   ALBUMIN 4.1 02/11/2023   LABGLOB 3.5 02/11/2023   AGRATIO 1.2 02/11/2023   BILITOT 0.5 02/11/2023   ALKPHOS 86 02/11/2023   AST 20 02/11/2023   ALT 8 02/11/2023   ANIONGAP 4 (L) 11/30/2018   Last lipids Lab Results  Component Value Date    CHOL 197 02/11/2023   HDL 70 02/11/2023   LDLCALC 111 (H) 02/11/2023   TRIG 92 02/11/2023   CHOLHDL 2.8 02/11/2023   Last hemoglobin A1c Lab Results  Component Value Date   HGBA1C 6.1 (H) 02/11/2023   Last thyroid functions Lab Results  Component Value Date   TSH 1.330 02/11/2023   Last vitamin D No results found for: "25OHVITD2", "25OHVITD3", "VD25OH" Last vitamin B12 and Folate No results found for: "VITAMINB12", "FOLATE"    Results for orders placed or performed in visit on 02/11/23  Hemoglobin A1c  Result Value Ref Range   Hgb A1c MFr Bld 6.1 (H) 4.8 - 5.6 %   Est. average glucose Bld gHb Est-mCnc 128 mg/dL  CBC with Differential  Result Value Ref Range   WBC 7.0 3.4 - 10.8 x10E3/uL   RBC 5.06 3.77 - 5.28 x10E6/uL   Hemoglobin 11.1 11.1 - 15.9 g/dL   Hematocrit 36.8 34.0 - 46.6 %   MCV 73 (L) 79 - 97 fL   MCH 21.9 (L) 26.6 - 33.0  pg   MCHC 30.2 (L) 31.5 - 35.7 g/dL   RDW 14.4 11.7 - 15.4 %   Platelets 381 150 - 450 x10E3/uL   Neutrophils 54 Not Estab. %   Lymphs 34 Not Estab. %   Monocytes 7 Not Estab. %   Eos 4 Not Estab. %   Basos 1 Not Estab. %   Neutrophils Absolute 3.7 1.4 - 7.0 x10E3/uL   Lymphocytes Absolute 2.4 0.7 - 3.1 x10E3/uL   Monocytes Absolute 0.5 0.1 - 0.9 x10E3/uL   EOS (ABSOLUTE) 0.3 0.0 - 0.4 x10E3/uL   Basophils Absolute 0.1 0.0 - 0.2 x10E3/uL   Immature Granulocytes 0 Not Estab. %   Immature Grans (Abs) 0.0 0.0 - 0.1 x10E3/uL  Lipid Panel  Result Value Ref Range   Cholesterol, Total 197 100 - 199 mg/dL   Triglycerides 92 0 - 149 mg/dL   HDL 70 >39 mg/dL   VLDL Cholesterol Cal 16 5 - 40 mg/dL   LDL Chol Calc (NIH) 111 (H) 0 - 99 mg/dL   Chol/HDL Ratio 2.8 0.0 - 4.4 ratio  CMP14+EGFR  Result Value Ref Range   Glucose 87 70 - 99 mg/dL   BUN 23 8 - 27 mg/dL   Creatinine, Ser 1.48 (H) 0.57 - 1.00 mg/dL   eGFR 38 (L) >59 mL/min/1.73   BUN/Creatinine Ratio 16 12 - 28   Sodium 142 134 - 144 mmol/L   Potassium 4.3 3.5 - 5.2 mmol/L    Chloride 103 96 - 106 mmol/L   CO2 23 20 - 29 mmol/L   Calcium 9.3 8.7 - 10.3 mg/dL   Total Protein 7.6 6.0 - 8.5 g/dL   Albumin 4.1 3.8 - 4.8 g/dL   Globulin, Total 3.5 1.5 - 4.5 g/dL   Albumin/Globulin Ratio 1.2 1.2 - 2.2   Bilirubin Total 0.5 0.0 - 1.2 mg/dL   Alkaline Phosphatase 86 44 - 121 IU/L   AST 20 0 - 40 IU/L   ALT 8 0 - 32 IU/L  TSH  Result Value Ref Range   TSH 1.330 0.450 - 4.500 uIU/mL  Microalbumin / creatinine urine ratio  Result Value Ref Range   Creatinine, Urine 120.9 Not Estab. mg/dL   Microalbumin, Urine 87.7 Not Estab. ug/mL   Microalb/Creat Ratio 73 (H) 0 - 29 mg/g creat      Assessment & Plan   Annual wellness visit done today including the all of the following: Reviewed patient's Family Medical History Reviewed and updated list of patient's medical providers Assessment of cognitive impairment was done Assessed patient's functional ability Established a written schedule for health screening services Health Risk Assessent Completed and Reviewed  Exercise Activities and Dietary recommendations  Goals   None     Immunization History  Administered Date(s) Administered   Fluad Quad(high Dose 65+) 08/21/2019, 07/24/2020, 08/06/2021, 08/11/2022   Influenza, High Dose Seasonal PF 08/12/2018   Influenza,inj,Quad PF,6+ Mos 11/01/2017   Pneumococcal Conjugate-13 09/25/2019   Pneumococcal Polysaccharide-23 08/28/2018   Tdap 08/30/2018, 08/30/2018   Zoster Recombinat (Shingrix) 12/07/2019, 03/05/2020    Health Maintenance  Topic Date Due   COVID-19 Vaccine (1) 11/15/2023 (Originally 09/04/1951)   INFLUENZA VACCINE  06/16/2023   Diabetic kidney evaluation - eGFR measurement  02/11/2024   Diabetic kidney evaluation - Urine ACR  02/11/2024   Medicare Annual Wellness (AWV)  02/11/2024   MAMMOGRAM  07/26/2024   COLONOSCOPY (Pts 45-53yrs Insurance coverage will need to be confirmed)  07/03/2028   DTaP/Tdap/Td (3 - Td or Tdap) 08/30/2028  Pneumonia  Vaccine 73+ Years old  Completed   DEXA SCAN  Completed   Hepatitis C Screening  Completed   Zoster Vaccines- Shingrix  Completed   HPV VACCINES  Aged Out     Discussed health benefits of physical activity, and encouraged her to engage in regular exercise appropriate for her age and condition.    Problem List Items Addressed This Visit   None Visit Diagnoses     Encounter for Medicare annual wellness exam    -  Primary   Relevant Orders   CMP14+EGFR (Completed)   Screening for deficiency anemia       Relevant Orders   CBC with Differential (Completed)   Screening for endocrine/metabolic/immunity disorders       Relevant Orders   Hemoglobin A1c (Completed)   TSH (Completed)   Screening for lipid disorders       Relevant Orders   Lipid Panel (Completed)   Type 2 diabetes mellitus without complication, without long-term current use of insulin ([place])       Relevant Orders   Microalbumin / creatinine urine ratio (Completed)   Encounter for weight management           Return in 1 year (on 02/11/2024).     Melene Plan, RMA

## 2023-03-07 ENCOUNTER — Other Ambulatory Visit: Payer: Self-pay | Admitting: Family Medicine

## 2023-03-08 NOTE — Telephone Encounter (Signed)
Requested Prescriptions  Pending Prescriptions Disp Refills   meloxicam (MOBIC) 7.5 MG tablet [Pharmacy Med Name: Meloxicam 7.5 MG Oral Tablet] 100 tablet 1    Sig: TAKE 1 TABLET BY MOUTH DAILY     Analgesics:  COX2 Inhibitors Failed - 03/07/2023  6:36 AM      Failed - Manual Review: Labs are only required if the patient has taken medication for more than 8 weeks.      Failed - Cr in normal range and within 360 days    Creatinine, Ser  Date Value Ref Range Status  02/11/2023 1.48 (H) 0.57 - 1.00 mg/dL Final         Passed - HGB in normal range and within 360 days    Hemoglobin  Date Value Ref Range Status  02/11/2023 11.1 11.1 - 15.9 g/dL Final         Passed - HCT in normal range and within 360 days    Hematocrit  Date Value Ref Range Status  02/11/2023 36.8 34.0 - 46.6 % Final         Passed - AST in normal range and within 360 days    AST  Date Value Ref Range Status  02/11/2023 20 0 - 40 IU/L Final         Passed - ALT in normal range and within 360 days    ALT  Date Value Ref Range Status  02/11/2023 8 0 - 32 IU/L Final         Passed - eGFR is 30 or above and within 360 days    GFR calc Af Amer  Date Value Ref Range Status  09/25/2019 62 >59 mL/min/1.73 Final   GFR calc non Af Amer  Date Value Ref Range Status  09/25/2019 53 (L) >59 mL/min/1.73 Final   eGFR  Date Value Ref Range Status  02/11/2023 38 (L) >59 mL/min/1.73 Final         Passed - Patient is not pregnant      Passed - Valid encounter within last 12 months    Recent Outpatient Visits           3 weeks ago Encounter for [employer] annual wellness exam   [facility], MD   2 months ago Encounter for weight management   [place] [facility], MD   3 months ago Encounter for weight management   [place] [facility], MD   4 months ago Encounter for weight management   [facility], MD   5 months ago Essential hypertension   [place] [facility], MD       Future Appointments             In 2 weeks Georganna Skeans, MD [facility]

## 2023-03-22 ENCOUNTER — Ambulatory Visit: Payer: 59 | Admitting: Family Medicine

## 2023-03-23 ENCOUNTER — Ambulatory Visit (INDEPENDENT_AMBULATORY_CARE_PROVIDER_SITE_OTHER): Payer: 59 | Admitting: Family Medicine

## 2023-03-23 ENCOUNTER — Encounter: Payer: Self-pay | Admitting: Family Medicine

## 2023-03-23 VITALS — BP 96/66 | HR 80 | Temp 98.1°F | Resp 16 | Wt 167.4 lb

## 2023-03-23 DIAGNOSIS — E663 Overweight: Secondary | ICD-10-CM | POA: Diagnosis not present

## 2023-03-23 DIAGNOSIS — Z7689 Persons encountering health services in other specified circumstances: Secondary | ICD-10-CM | POA: Diagnosis not present

## 2023-03-23 DIAGNOSIS — Z6828 Body mass index (BMI) 28.0-28.9, adult: Secondary | ICD-10-CM | POA: Diagnosis not present

## 2023-03-23 MED ORDER — PHENTERMINE HCL 37.5 MG PO CAPS: 37.5000 mg | ORAL_CAPSULE | ORAL | 0 refills | Status: DC

## 2023-03-29 ENCOUNTER — Encounter: Payer: Self-pay | Admitting: Family Medicine

## 2023-03-29 NOTE — Progress Notes (Unsigned)
Established Patient Office Visit  Subjective    Patient ID: Barbara Adkins, female    DOB: 1951/01/23  Age: 72 y.o. MRN: [id number]  CC:  Chief Complaint  Patient presents with   Follow-up    HPI Barbara Adkins presents for routine weight management. Patient denies acute complaints or concerns.    Outpatient Encounter Medications as of 03/23/2023  Medication Sig   amLODipine (NORVASC) 10 MG tablet Take 1 tablet (10 mg total) by mouth daily.   atorvastatin (LIPITOR) 10 MG tablet Atorvastatin Calcium   atorvastatin (LIPITOR) 20 MG tablet TAKE 1 TABLET BY MOUTH ONCE  DAILY   Blood Pressure Monitoring (BLOOD PRESSURE KIT) DEVI 1 Device by Does not apply route 3 (three) times a week. Use to check blood pressure three times a week. ICD10 I10   budesonide-formoterol (SYMBICORT) 80-4.5 MCG/ACT inhaler Symbicort   budesonide-formoterol (SYMBICORT) 80-4.5 MCG/ACT inhaler Inhale 2 puffs into the lungs in the morning and at bedtime.   cetirizine (ZYRTEC) 10 MG tablet Take 1 tablet (10 mg total) by mouth daily.   fluticasone (FLONASE ALLERGY RELIEF) 50 MCG/ACT nasal spray Flonase   fluticasone (FLONASE) 50 MCG/ACT nasal spray Place 2 sprays into both nostrils daily.   fluticasone (FLONASE) 50 MCG/ACT nasal spray Place 2 sprays into both nostrils daily.   hydrochlorothiazide (HYDRODIURIL) 25 MG tablet Take 1 tablet (25 mg total) by mouth daily.   influenza vaccine adjuvanted (FLUAD QUADRIVALENT) 0.5 ML injection Inject into the muscle.   influenza vaccine adjuvanted (FLUAD) 0.5 ML injection Inject 0.5 mLs into the muscle.   ketorolac (ACULAR) 0.5 % ophthalmic solution Place 1 drop into the left eye 4 (four) times daily.   latanoprost (XALATAN) 0.005 % ophthalmic solution Place 1 drop into both eyes nightly.   latanoprost (XALATAN) 0.005 % ophthalmic solution Place 1 drop into both eyes nightly.   loratadine (CLARITIN) 5 MG chewable tablet Claritin   losartan (COZAAR) 100 MG tablet  TAKE 1 TABLET BY MOUTH EVERY DAY   meloxicam (MOBIC) 7.5 MG tablet TAKE 1 TABLET BY MOUTH DAILY   metFORMIN (GLUCOPHAGE) 500 MG tablet TAKE 1 TABLET BY MOUTH DAILY  WITH BREAKFAST   molnupiravir EUA (LAGEVRIO) 200 MG CAPS capsule 4 capsules   montelukast (SINGULAIR) 10 MG tablet TAKE 1 TABLET BY MOUTH AT  BEDTIME   omeprazole (PRILOSEC) 10 MG capsule Take 2 capsules (20 mg total) by mouth daily.   omeprazole (PRILOSEC) 20 MG capsule Take 1 capsule (20 mg total) by mouth daily.   prednisoLONE acetate (PRED FORTE) 1 % ophthalmic suspension Place 1 drop into the left eye 4 (four) times daily. Start using day after surgery as instructed   Timolol-Dorzolamid-Latanoprost 0.5-0.15-0.005 % SOLN Apply to eye.   [DISCONTINUED] phentermine 37.5 MG capsule Take 1 capsule (37.5 mg total) by mouth every morning.   acetaminophen (TYLENOL) 325 MG tablet Take 2 tablets (650 mg total) by mouth every 6 (six) hours. (Patient not taking: Reported on 03/25/2020)   albuterol (VENTOLIN HFA) 108 (90 Base) MCG/ACT inhaler Inhale 2 puffs into the lungs every 4 (four) hours. (Patient not taking: Reported on 02/01/2022)   phentermine 37.5 MG capsule Take 1 capsule (37.5 mg total) by mouth every morning.   No facility-administered encounter medications on file as of 03/23/2023.    Past Medical History:  Diagnosis Date   Allergy    Arthritis    Deviated septum    Diabetes mellitus without complication (HCC)    GERD (gastroesophageal reflux disease)  Hypertension     Past Surgical History:  Procedure Laterality Date   ETHMOIDECTOMY Right 05/17/2018   Procedure: RIGHT ETHMOIDECTOMY WITH TISSUE REMOVAL;  Surgeon: Newman Pies, MD;  Location: [facility];  Service: ENT;  Laterality: Right;   FRONTAL SINUS EXPLORATION Right 05/17/2018   Procedure: RIGHT FRONTAL RECESS EXPLORATION;  Surgeon: Newman Pies, MD;  Location: [facility];  Service: ENT;  Laterality: Right;   MAXILLARY ANTROSTOMY Right 05/17/2018    Procedure: RIGHT MAXILLARY ANTROSTOMY WITH TISSUE REMOVAL;  Surgeon: Newman Pies, MD;  Location: [facility];  Service: ENT;  Laterality: Right;   NASAL SEPTOPLASTY W/ TURBINOPLASTY Bilateral 05/17/2018   Procedure: NASAL SEPTOPLASTY WITH TURBINATE REDUCTION;  Surgeon: Newman Pies, MD;  Location: [facility];  Service: ENT;  Laterality: Bilateral;   SINUS ENDO WITH FUSION N/A 05/17/2018   Procedure: SINUS ENDO WITH FUSION;  Surgeon: Newman Pies, MD;  Location: [facility];  Service: ENT;  Laterality: N/A;   SPHENOIDECTOMY Right 05/17/2018   Procedure: RIGHT SPHENOIDECTOMY WITH TISSUE REMOVAL;  Surgeon: Newman Pies, MD;  Location: [facility];  Service: ENT;  Laterality: Right;   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   Procedure: TOTAL HIP ARTHROPLASTY;  Surgeon: Valeria Batman, MD;  [facility];  Service: Orthopedics;  Laterality: Left;   TUBAL LIGATION      Family History  Problem Relation Age of Onset   Asthma Other    Cancer Other    Hypertension Other    Diabetes Other    Colon cancer Maternal Grandmother    Breast cancer Neg Hx    Esophageal cancer Neg Hx    Rectal cancer Neg Hx    Stomach cancer Neg Hx     Social History   Socioeconomic History   Marital status: Widowed    Spouse name: Not on file   Number of children: Not on file   Years of education: Not on file   Highest education level: Some college, no degree  Occupational History   Not on file  Tobacco Use   Smoking status: Never   Smokeless tobacco: Never  Vaping Use   Vaping Use: Never used  Substance and Sexual Activity   Alcohol use: No   Drug use: Never   Sexual activity: Not on file  Other Topics Concern   Not on file  Social History Narrative   Not on file   Social Determinants of Health   Financial Resource Strain: Medium Risk (03/19/2023)   Overall Financial Resource Strain (CARDIA)    Difficulty of Paying Living Expenses: Somewhat hard  Food  Insecurity: No Food Insecurity (03/19/2023)   Hunger Vital Sign    Worried About Running Out of Food in the Last Year: Never true    Ran Out of Food in the Last Year: Never true  Transportation Needs: No Transportation Needs (03/19/2023)   PRAPARE - Administrator, Civil Service (Medical): No    Lack of Transportation (Non-Medical): No  Physical Activity: Insufficiently Active (03/19/2023)   Exercise Vital Sign    Days of Exercise per Week: 3 days    Minutes of Exercise per Session: 20 min  Stress: No Stress Concern Present (03/19/2023)   [employer] of Occupational Health - Occupational Stress Questionnaire    Feeling of Stress : Not at all  Social Connections: Moderately Integrated (03/19/2023)   Social Connection and Isolation Panel [NHANES]    Frequency of Communication with Friends  and Family: More than three times a week    Frequency of Social Gatherings with Friends and Family: More than three times a week    Attends Religious Services: More than 4 times per year    Active Member of Clubs or Organizations: No    Attends Engineer, structural: More than 4 times per year    Marital Status: Widowed  Intimate Partner Violence: Unknown (02/11/2023)   Humiliation, Afraid, Rape, and Kick questionnaire    Fear of Current or Ex-Partner: No    Emotionally Abused: No    Physically Abused: Not on file    Sexually Abused: No    Review of Systems  All other systems reviewed and are negative.       Objective    BP 96/66   Pulse 80   Temp 98.1 F (36.7 C) (Oral)   Resp 16   Wt 167 lb 6.4 oz (75.9 kg)   SpO2 97%   BMI 29.65 kg/m   Physical Exam Vitals and nursing note reviewed.  Constitutional:      General: She is not in acute distress.    Appearance: She is obese.  Cardiovascular:     Rate and Rhythm: Normal rate and regular rhythm.  Pulmonary:     Effort: Pulmonary effort is normal.     Breath sounds: Normal breath sounds.  Abdominal:      Palpations: Abdomen is soft.     Tenderness: There is no abdominal tenderness.  Musculoskeletal:     Right lower leg: No edema.     Left lower leg: No edema.  Neurological:     General: No focal deficit present.     Mental Status: She is alert and oriented to person, place, and time.     Deep Tendon Reflexes: Abnormal reflex: .diagmed.     {Labs (Optional):23779}    Assessment & Plan:   1. Encounter for weight management Phentermine prescribed. Goal is 2-4lbs/mo wt loss.   2. Overweight (BMI 25.0-29.9) As above. Nearing goal    No follow-ups on file.   Tommie Raymond, MD

## 2023-04-28 ENCOUNTER — Encounter: Payer: Self-pay | Admitting: Family Medicine

## 2023-04-28 ENCOUNTER — Ambulatory Visit (INDEPENDENT_AMBULATORY_CARE_PROVIDER_SITE_OTHER): Payer: 59 | Admitting: Family Medicine

## 2023-04-28 VITALS — BP 117/78 | HR 71 | Temp 98.1°F | Resp 16 | Wt 163.6 lb

## 2023-04-28 DIAGNOSIS — Z7689 Persons encountering health services in other specified circumstances: Secondary | ICD-10-CM

## 2023-04-28 DIAGNOSIS — Z6828 Body mass index (BMI) 28.0-28.9, adult: Secondary | ICD-10-CM

## 2023-04-28 DIAGNOSIS — E663 Overweight: Secondary | ICD-10-CM | POA: Diagnosis not present

## 2023-04-28 MED ORDER — PHENTERMINE HCL 37.5 MG PO CAPS: 37.5000 mg | ORAL_CAPSULE | ORAL | 0 refills | Status: DC

## 2023-04-28 NOTE — Progress Notes (Signed)
Patient came in for monthly weight check. Patient has no other concerns today  

## 2023-04-29 ENCOUNTER — Encounter: Payer: Self-pay | Admitting: Family Medicine

## 2023-04-29 NOTE — Progress Notes (Signed)
Established Patient Office Visit  Subjective    Patient ID: Barbara Adkins, female    DOB: November 20, 1950  Age: 72 y.o. MRN: [id number]  CC: No chief complaint on file.   HPI [employer] presents for routine wight management. Patient denies acute complaints or concerns.    Outpatient Encounter Medications as of 04/28/2023  Medication Sig   acetaminophen (TYLENOL) 325 MG tablet Take 2 tablets (650 mg total) by mouth every 6 (six) hours. (Patient not taking: Reported on 03/25/2020)   albuterol (VENTOLIN HFA) 108 (90 Base) MCG/ACT inhaler Inhale 2 puffs into the lungs every 4 (four) hours. (Patient not taking: Reported on 02/01/2022)   amLODipine (NORVASC) 10 MG tablet Take 1 tablet (10 mg total) by mouth daily.   atorvastatin (LIPITOR) 10 MG tablet Atorvastatin Calcium   atorvastatin (LIPITOR) 20 MG tablet TAKE 1 TABLET BY MOUTH ONCE  DAILY   Blood Pressure Monitoring (BLOOD PRESSURE KIT) DEVI 1 Device by Does not apply route 3 (three) times a week. Use to check blood pressure three times a week. ICD10 I10   budesonide-formoterol (SYMBICORT) 80-4.5 MCG/ACT inhaler Symbicort   budesonide-formoterol (SYMBICORT) 80-4.5 MCG/ACT inhaler Inhale 2 puffs into the lungs in the morning and at bedtime.   cetirizine (ZYRTEC) 10 MG tablet Take 1 tablet (10 mg total) by mouth daily.   fluticasone (FLONASE ALLERGY RELIEF) 50 MCG/ACT nasal spray Flonase   fluticasone (FLONASE) 50 MCG/ACT nasal spray Place 2 sprays into both nostrils daily.   fluticasone (FLONASE) 50 MCG/ACT nasal spray Place 2 sprays into both nostrils daily.   hydrochlorothiazide (HYDRODIURIL) 25 MG tablet Take 1 tablet (25 mg total) by mouth daily.   influenza vaccine adjuvanted (FLUAD QUADRIVALENT) 0.5 ML injection Inject into the muscle.   influenza vaccine adjuvanted (FLUAD) 0.5 ML injection Inject 0.5 mLs into the muscle.   ketorolac (ACULAR) 0.5 % ophthalmic solution Place 1 drop into the left eye 4 (four) times daily.    latanoprost (XALATAN) 0.005 % ophthalmic solution Place 1 drop into both eyes nightly.   latanoprost (XALATAN) 0.005 % ophthalmic solution Place 1 drop into both eyes nightly.   loratadine (CLARITIN) 5 MG chewable tablet Claritin   losartan (COZAAR) 100 MG tablet TAKE 1 TABLET BY MOUTH EVERY DAY   meloxicam (MOBIC) 7.5 MG tablet TAKE 1 TABLET BY MOUTH DAILY   metFORMIN (GLUCOPHAGE) 500 MG tablet TAKE 1 TABLET BY MOUTH DAILY  WITH BREAKFAST   molnupiravir EUA (LAGEVRIO) 200 MG CAPS capsule 4 capsules   montelukast (SINGULAIR) 10 MG tablet TAKE 1 TABLET BY MOUTH AT  BEDTIME   omeprazole (PRILOSEC) 10 MG capsule Take 2 capsules (20 mg total) by mouth daily.   omeprazole (PRILOSEC) 20 MG capsule Take 1 capsule (20 mg total) by mouth daily.   phentermine 37.5 MG capsule Take 1 capsule (37.5 mg total) by mouth every morning.   prednisoLONE acetate (PRED FORTE) 1 % ophthalmic suspension Place 1 drop into the left eye 4 (four) times daily. Start using day after surgery as instructed   Timolol-Dorzolamid-Latanoprost 0.5-0.15-0.005 % SOLN Apply to eye.   [DISCONTINUED] phentermine 37.5 MG capsule Take 1 capsule (37.5 mg total) by mouth every morning.   No facility-administered encounter medications on file as of 04/28/2023.    Past Medical History:  Diagnosis Date   Allergy    Arthritis    Deviated septum    Diabetes mellitus without complication (HCC)    GERD (gastroesophageal reflux disease)    Hypertension  Past Surgical History:  Procedure Laterality Date   ETHMOIDECTOMY Right 05/17/2018   Procedure: RIGHT ETHMOIDECTOMY WITH TISSUE REMOVAL;  Surgeon: Newman Pies, MD;  Location: [facility];  Service: ENT;  Laterality: Right;   FRONTAL SINUS EXPLORATION Right 05/17/2018   Procedure: RIGHT FRONTAL RECESS EXPLORATION;  Surgeon: Newman Pies, MD;  Location: [facility];  Service: ENT;  Laterality: Right;   MAXILLARY ANTROSTOMY Right 05/17/2018   Procedure: RIGHT MAXILLARY  ANTROSTOMY WITH TISSUE REMOVAL;  Surgeon: Newman Pies, MD;  Location: [facility];  Service: ENT;  Laterality: Right;   NASAL SEPTOPLASTY W/ TURBINOPLASTY Bilateral 05/17/2018   Procedure: NASAL SEPTOPLASTY WITH TURBINATE REDUCTION;  Surgeon: Newman Pies, MD;  Location: [facility];  Service: ENT;  Laterality: Bilateral;   SINUS ENDO WITH FUSION N/A 05/17/2018   Procedure: SINUS ENDO WITH FUSION;  Surgeon: Newman Pies, MD;  Location: [facility];  Service: ENT;  Laterality: N/A;   SPHENOIDECTOMY Right 05/17/2018   Procedure: RIGHT SPHENOIDECTOMY WITH TISSUE REMOVAL;  Surgeon: Newman Pies, MD;  [facility];  Service: ENT;  Laterality: Right;   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   Procedure: TOTAL HIP ARTHROPLASTY;  Surgeon: Valeria Batman, MD;  [facility];  Service: Orthopedics;  Laterality: Left;   TUBAL LIGATION      Family History  Problem Relation Age of Onset   Asthma Other    Cancer Other    Hypertension Other    Diabetes Other    Colon cancer Maternal Grandmother    Breast cancer Neg Hx    Esophageal cancer Neg Hx    Rectal cancer Neg Hx    Stomach cancer Neg Hx     Social History   Socioeconomic History   Marital status: Widowed    Spouse name: Not on file   Number of children: Not on file   Years of education: Not on file   Highest education level: Some college, no degree  Occupational History   Not on file  Tobacco Use   Smoking status: Never   Smokeless tobacco: Never  Vaping Use   Vaping Use: Never used  Substance and Sexual Activity   Alcohol use: No   Drug use: Never   Sexual activity: Not on file  Other Topics Concern   Not on file  Social History Narrative   Not on file   Social Determinants of Health   Financial Resource Strain: Medium Risk (03/19/2023)   Overall Financial Resource Strain (CARDIA)    Difficulty of Paying Living Expenses: Somewhat hard  Food Insecurity: No Food Insecurity  (03/19/2023)   Hunger Vital Sign    Worried About Running Out of Food in the Last Year: Never true    Ran Out of Food in the Last Year: Never true  Transportation Needs: No Transportation Needs (03/19/2023)   PRAPARE - Administrator, Civil Service (Medical): No    Lack of Transportation (Non-Medical): No  Physical Activity: Insufficiently Active (03/19/2023)   Exercise Vital Sign    Days of Exercise per Week: 3 days    Minutes of Exercise per Session: 20 min  Stress: No Stress Concern Present (03/19/2023)   [employer] of Occupational Health - Occupational Stress Questionnaire    Feeling of Stress : Not at all  Social Connections: Moderately Integrated (03/19/2023)   Social Connection and Isolation Panel [NHANES]    Frequency of Communication with Friends and Family: More than three  times a week    Frequency of Social Gatherings with Friends and Family: More than three times a week    Attends Religious Services: More than 4 times per year    Active Member of Clubs or Organizations: No    Attends Engineer, structural: More than 4 times per year    Marital Status: Widowed  Intimate Partner Violence: Unknown (02/11/2023)   Humiliation, Afraid, Rape, and Kick questionnaire    Fear of Current or Ex-Partner: No    Emotionally Abused: No    Physically Abused: Not on file    Sexually Abused: No    Review of Systems  All other systems reviewed and are negative.       Objective    BP 117/78   Pulse 71   Temp 98.1 F (36.7 C) (Oral)   Resp 16   Wt 163 lb 9.6 oz (74.2 kg)   SpO2 95%   BMI 28.98 kg/m   Physical Exam Vitals and nursing note reviewed.  Constitutional:      General: She is not in acute distress.    Appearance: She is obese.  Cardiovascular:     Rate and Rhythm: Normal rate and regular rhythm.  Pulmonary:     Effort: Pulmonary effort is normal.     Breath sounds: Normal breath sounds.  Abdominal:     Palpations: Abdomen is soft.      Tenderness: There is no abdominal tenderness.  Musculoskeletal:     Right lower leg: No edema.     Left lower leg: No edema.  Neurological:     General: No focal deficit present.     Mental Status: She is alert and oriented to person, place, and time.     Deep Tendon Reflexes: Abnormal reflex: .diagmed.         Assessment & Plan:   1. Encounter for weight management Patient doing well with present management. Phentermine refilled.   2. Overweight (BMI 25.0-29.9)    Return in about 4 weeks (around 05/26/2023).   Tommie Raymond, MD

## 2023-04-30 ENCOUNTER — Other Ambulatory Visit: Payer: Self-pay | Admitting: Family Medicine

## 2023-05-02 NOTE — Telephone Encounter (Signed)
Requested Prescriptions  Pending Prescriptions Disp Refills   losartan (COZAAR) 100 MG tablet [Pharmacy Med Name: Losartan Potassium 100 MG Oral Tablet] 100 tablet 1    Sig: TAKE 1 TABLET BY MOUTH EVERY DAY     Cardiovascular:  Angiotensin Receptor Blockers Failed - 04/30/2023 10:37 PM      Failed - Cr in normal range and within 180 days    Creatinine, Ser  Date Value Ref Range Status  02/11/2023 1.48 (H) 0.57 - 1.00 mg/dL Final         Passed - K in normal range and within 180 days    Potassium  Date Value Ref Range Status  02/11/2023 4.3 3.5 - 5.2 mmol/L Final         Passed - Patient is not pregnant      Passed - Last BP in normal range    BP Readings from Last 1 Encounters:  04/28/23 117/78         Passed - Valid encounter within last 6 months    Recent Outpatient Visits           4 days ago Encounter for weight management   [facility], MD   1 month ago Encounter for weight management   [place] [facility], MD   2 months ago Encounter for Medicare annual wellness exam   [place] [facility], Lauris Poag, MD   3 months ago Encounter for weight management   [place] [facility], Lauris Poag, MD   4 months ago Encounter for weight management    [facility], Lauris Poag, MD       Future Appointments             In 4 weeks Georganna Skeans, MD [facility]             metFORMIN (GLUCOPHAGE) 500 MG tablet [Pharmacy Med Name: metFORMIN HCl 500 MG Oral Tablet] 100 tablet 1    Sig: TAKE 1 TABLET BY MOUTH DAILY  WITH BREAKFAST     Endocrinology:  Diabetes - Biguanides Failed - 04/30/2023 10:37 PM      Failed - Cr in normal range and within 360 days    Creatinine, Ser  Date Value Ref Range Status  02/11/2023 1.48 (H) 0.57 - 1.00 mg/dL Final         Failed - eGFR in normal  range and within 360 days    GFR calc Af Amer  Date Value Ref Range Status  09/25/2019 62 >59 mL/min/1.73 Final   GFR calc non Af Amer  Date Value Ref Range Status  09/25/2019 53 (L) >59 mL/min/1.73 Final   eGFR  Date Value Ref Range Status  02/11/2023 38 (L) >59 mL/min/1.73 Final         Failed - B12 Level in normal range and within 720 days    No results found for: "VITAMINB12"       Passed - HBA1C is between 0 and 7.9 and within 180 days    Hgb A1c MFr Bld  Date Value Ref Range Status  02/11/2023 6.1 (H) 4.8 - 5.6 % Final    Comment:             Prediabetes: 5.7 - 6.4          Diabetes: >6.4  Glycemic control for adults with diabetes: <7.0          Passed - Valid encounter within last 6 months    Recent Outpatient Visits           4 days ago Encounter for weight management   [place] [facility], MD   1 month ago Encounter for weight management   [place] [facility], MD   2 months ago Encounter for Medicare annual wellness exam   [place] [facility], MD   3 months ago Encounter for weight management   [place] [facility], MD   4 months ago Encounter for weight management   [place] [facility], MD       Future Appointments             In 4 weeks Georganna Skeans, MD [facility] - CBC within normal limits and completed in the last 12 months    WBC  Date Value Ref Range Status  02/11/2023 7.0 3.4 - 10.8 x10E3/uL Final  11/30/2018 11.0 (H) 4.0 - 10.5 K/uL Final   RBC  Date Value Ref Range Status  02/11/2023 5.06 3.77 - 5.28 x10E6/uL Final  11/30/2018 4.85 3.87 - 5.11 MIL/uL Final   Hemoglobin  Date Value Ref Range Status  02/11/2023 11.1 11.1 - 15.9 g/dL Final   Hematocrit  Date Value Ref Range Status   02/11/2023 36.8 34.0 - 46.6 % Final   MCHC  Date Value Ref Range Status  02/11/2023 30.2 (L) 31.5 - 35.7 g/dL Final  40/98/1191 47.8 (L) 30.0 - 36.0 g/dL Final   [facility]  Date Value Ref Range Status  02/11/2023 21.9 (L) 26.6 - 33.0 pg Final  11/30/2018 20.0 (L) 26.0 - 34.0 pg Final   MCV  Date Value Ref Range Status  02/11/2023 73 (L) 79 - 97 fL Final   No results found for: "PLTCOUNTKUC", "LABPLAT", "POCPLA" RDW  Date Value Ref Range Status  02/11/2023 14.4 11.7 - 15.4 % Final

## 2023-05-31 ENCOUNTER — Ambulatory Visit (INDEPENDENT_AMBULATORY_CARE_PROVIDER_SITE_OTHER): Payer: 59 | Admitting: Family Medicine

## 2023-05-31 ENCOUNTER — Encounter: Payer: Self-pay | Admitting: Family Medicine

## 2023-05-31 VITALS — BP 93/62 | HR 77 | Temp 98.1°F | Resp 16 | Wt 159.0 lb

## 2023-05-31 DIAGNOSIS — Z6828 Body mass index (BMI) 28.0-28.9, adult: Secondary | ICD-10-CM

## 2023-05-31 DIAGNOSIS — Z7689 Persons encountering health services in other specified circumstances: Secondary | ICD-10-CM

## 2023-05-31 DIAGNOSIS — E663 Overweight: Secondary | ICD-10-CM

## 2023-05-31 MED ORDER — PHENTERMINE HCL 37.5 MG PO CAPS: 37.5000 mg | ORAL_CAPSULE | ORAL | 0 refills | Status: DC

## 2023-05-31 NOTE — Progress Notes (Signed)
Patient came in for monthly weight check. Patient has no other concerns today

## 2023-06-02 ENCOUNTER — Encounter: Payer: Self-pay | Admitting: Family Medicine

## 2023-06-02 NOTE — Progress Notes (Signed)
Established Patient Office Visit  Subjective    Patient ID: Barbara Adkins, female    DOB: 1951-07-21  Age: 72 y.o. MRN: [id number]  CC:  Chief Complaint  Patient presents with   Weight Check    HPI [name] presents for routine weight management.    Outpatient Encounter Medications as of 05/31/2023  Medication Sig   amLODipine (NORVASC) 10 MG tablet Take 1 tablet (10 mg total) by mouth daily.   atorvastatin (LIPITOR) 10 MG tablet Atorvastatin Calcium   atorvastatin (LIPITOR) 20 MG tablet TAKE 1 TABLET BY MOUTH ONCE  DAILY   Blood Pressure Monitoring (BLOOD PRESSURE KIT) DEVI 1 Device by Does not apply route 3 (three) times a week. Use to check blood pressure three times a week. ICD10 I10   budesonide-formoterol (SYMBICORT) 80-4.5 MCG/ACT inhaler Symbicort   budesonide-formoterol (SYMBICORT) 80-4.5 MCG/ACT inhaler Inhale 2 puffs into the lungs in the morning and at bedtime.   cetirizine (ZYRTEC) 10 MG tablet Take 1 tablet (10 mg total) by mouth daily.   fluticasone (FLONASE ALLERGY RELIEF) 50 MCG/ACT nasal spray Flonase   fluticasone (FLONASE) 50 MCG/ACT nasal spray Place 2 sprays into both nostrils daily.   fluticasone (FLONASE) 50 MCG/ACT nasal spray Place 2 sprays into both nostrils daily.   hydrochlorothiazide (HYDRODIURIL) 25 MG tablet Take 1 tablet (25 mg total) by mouth daily.   influenza vaccine adjuvanted (FLUAD QUADRIVALENT) 0.5 ML injection Inject into the muscle.   influenza vaccine adjuvanted (FLUAD) 0.5 ML injection Inject 0.5 mLs into the muscle.   ketorolac (ACULAR) 0.5 % ophthalmic solution Place 1 drop into the left eye 4 (four) times daily.   latanoprost (XALATAN) 0.005 % ophthalmic solution Place 1 drop into both eyes nightly.   latanoprost (XALATAN) 0.005 % ophthalmic solution Place 1 drop into both eyes nightly.   loratadine (CLARITIN) 5 MG chewable tablet Claritin   losartan (COZAAR) 100 MG tablet TAKE 1 TABLET BY MOUTH EVERY DAY    meloxicam (MOBIC) 7.5 MG tablet TAKE 1 TABLET BY MOUTH DAILY   metFORMIN (GLUCOPHAGE) 500 MG tablet TAKE 1 TABLET BY MOUTH DAILY  WITH BREAKFAST   molnupiravir EUA (LAGEVRIO) 200 MG CAPS capsule 4 capsules   montelukast (SINGULAIR) 10 MG tablet TAKE 1 TABLET BY MOUTH AT  BEDTIME   omeprazole (PRILOSEC) 10 MG capsule Take 2 capsules (20 mg total) by mouth daily.   omeprazole (PRILOSEC) 20 MG capsule Take 1 capsule (20 mg total) by mouth daily.   prednisoLONE acetate (PRED FORTE) 1 % ophthalmic suspension Place 1 drop into the left eye 4 (four) times daily. Start using day after surgery as instructed   Timolol-Dorzolamid-Latanoprost 0.5-0.15-0.005 % SOLN Apply to eye.   [DISCONTINUED] phentermine 37.5 MG capsule Take 1 capsule (37.5 mg total) by mouth every morning.   acetaminophen (TYLENOL) 325 MG tablet Take 2 tablets (650 mg total) by mouth every 6 (six) hours. (Patient not taking: Reported on 03/25/2020)   albuterol (VENTOLIN HFA) 108 (90 Base) MCG/ACT inhaler Inhale 2 puffs into the lungs every 4 (four) hours. (Patient not taking: Reported on 02/01/2022)   phentermine 37.5 MG capsule Take 1 capsule (37.5 mg total) by mouth every morning.   No facility-administered encounter medications on file as of 05/31/2023.    Past Medical History:  Diagnosis Date   Allergy    Arthritis    Deviated septum    Diabetes mellitus without complication (HCC)    GERD (gastroesophageal reflux disease)    Hypertension  Past Surgical History:  Procedure Laterality Date   ETHMOIDECTOMY Right 05/17/2018   Procedure: RIGHT ETHMOIDECTOMY WITH TISSUE REMOVAL;  Surgeon: Newman Pies, MD;  Location: [facility];  Service: ENT;  Laterality: Right;   FRONTAL SINUS EXPLORATION Right 05/17/2018   Procedure: RIGHT FRONTAL RECESS EXPLORATION;  Surgeon: Newman Pies, MD;  Location: [facility];  Service: ENT;  Laterality: Right;   MAXILLARY ANTROSTOMY Right 05/17/2018   Procedure: RIGHT MAXILLARY  ANTROSTOMY WITH TISSUE REMOVAL;  Surgeon: Newman Pies, MD;  Location: [facility];  Service: ENT;  Laterality: Right;   NASAL SEPTOPLASTY W/ TURBINOPLASTY Bilateral 05/17/2018   Procedure: NASAL SEPTOPLASTY WITH TURBINATE REDUCTION;  Surgeon: Newman Pies, MD;  Location: [facility];  Service: ENT;  Laterality: Bilateral;   SINUS ENDO WITH FUSION N/A 05/17/2018   Procedure: SINUS ENDO WITH FUSION;  Surgeon: Newman Pies, MD;  Location: [facility];  Service: ENT;  Laterality: N/A;   SPHENOIDECTOMY Right 05/17/2018   Procedure: RIGHT SPHENOIDECTOMY WITH TISSUE REMOVAL;  Surgeon: Newman Pies, MD;  Location:  SUR[facility]ERY [facility];  Service: ENT;  Laterality: Right;   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   Procedure: TOTAL HIP ARTHROPLASTY;  Surgeon: Valeria Batman, MD;  Location: [facility];  Service: Orthopedics;  Laterality: Left;   TUBAL LIGATION      Family History  Problem Relation Age of Onset   Asthma Other    Cancer Other    Hypertension Other    Diabetes Other    Colon cancer Maternal Grandmother    Breast cancer Neg Hx    Esophageal cancer Neg Hx    Rectal cancer Neg Hx    Stomach cancer Neg Hx     Social History   Socioeconomic History   Marital status: Widowed    Spouse name: Not on file   Number of children: Not on file   Years of education: Not on file   Highest education level: Some college, no degree  Occupational History   Not on file  Tobacco Use   Smoking status: Never   Smokeless tobacco: Never  Vaping Use   Vaping status: Never Used  Substance and Sexual Activity   Alcohol use: No   Drug use: Never   Sexual activity: Not on file  Other Topics Concern   Not on file  Social History Narrative   Not on file   Social Determinants of Health   Financial Resource Strain: Medium Risk (03/19/2023)   Overall Financial Resource Strain (CARDIA)    Difficulty of Paying Living Expenses: Somewhat hard  Food Insecurity: No Food Insecurity  (03/19/2023)   Hunger Vital Sign    Worried About Running Out of Food in the Last Year: Never true    Ran Out of Food in the Last Year: Never true  Transportation Needs: No Transportation Needs (03/19/2023)   PRAPARE - Administrator, Civil Service (Medical): No    Lack of Transportation (Non-Medical): No  Physical Activity: Insufficiently Active (03/19/2023)   Exercise Vital Sign    Days of Exercise per Week: 3 days    Minutes of Exercise per Session: 20 min  Stress: No Stress Concern Present (03/19/2023)   [employer] of Occupational Health - Occupational Stress Questionnaire    Feeling of Stress : Not at all  Social Connections: Moderately Integrated (03/19/2023)   Social Connection and Isolation Panel [NHANES]    Frequency of Communication with Friends and Family: More than three  times a week    Frequency of Social Gatherings with Friends and Family: More than three times a week    Attends Religious Services: More than 4 times per year    Active Member of Clubs or Organizations: No    Attends Engineer, structural: More than 4 times per year    Marital Status: Widowed  Intimate Partner Violence: Unknown (02/11/2023)   Humiliation, Afraid, Rape, and Kick questionnaire    Fear of Current or Ex-Partner: No    Emotionally Abused: No    Physically Abused: Not on file    Sexually Abused: No    Review of Systems  All other systems reviewed and are negative.       Objective    BP 93/62   Pulse 77   Temp 98.1 F (36.7 C) (Oral)   Resp 16   Wt 159 lb (72.1 kg)   SpO2 96%   BMI 28.17 kg/m   Physical Exam Vitals and nursing note reviewed.  Constitutional:      General: She is not in acute distress.    Appearance: She is obese.  Cardiovascular:     Rate and Rhythm: Normal rate and regular rhythm.  Pulmonary:     Effort: Pulmonary effort is normal.     Breath sounds: Normal breath sounds.  Abdominal:     Palpations: Abdomen is soft.     Tenderness:  There is no abdominal tenderness.  Musculoskeletal:     Right lower leg: No edema.     Left lower leg: No edema.  Neurological:     General: No focal deficit present.     Mental Status: She is alert and oriented to person, place, and time.     Deep Tendon Reflexes: Abnormal reflex: .diagmed.         Assessment & Plan:   1. Encounter for weight management Doing well. Phentermine refilled  2. Overweight (BMI 25.0-29.9)     Return in about 4 weeks (around 06/28/2023) for follow up.   Tommie Raymond, MD

## 2023-06-18 ENCOUNTER — Other Ambulatory Visit: Payer: Self-pay | Admitting: Family Medicine

## 2023-07-04 ENCOUNTER — Ambulatory Visit (INDEPENDENT_AMBULATORY_CARE_PROVIDER_SITE_OTHER): Payer: Medicare Other | Admitting: Family Medicine

## 2023-07-04 ENCOUNTER — Encounter: Payer: Self-pay | Admitting: Family Medicine

## 2023-07-04 ENCOUNTER — Other Ambulatory Visit: Payer: Self-pay | Admitting: Family Medicine

## 2023-07-04 VITALS — BP 103/70 | HR 70 | Temp 98.2°F | Resp 16 | Ht 63.0 in | Wt 158.0 lb

## 2023-07-04 DIAGNOSIS — E663 Overweight: Secondary | ICD-10-CM | POA: Diagnosis not present

## 2023-07-04 DIAGNOSIS — Z6827 Body mass index (BMI) 27.0-27.9, adult: Secondary | ICD-10-CM | POA: Diagnosis not present

## 2023-07-04 DIAGNOSIS — Z1231 Encounter for screening mammogram for malignant neoplasm of breast: Secondary | ICD-10-CM

## 2023-07-04 DIAGNOSIS — Z7689 Persons encountering health services in other specified circumstances: Secondary | ICD-10-CM

## 2023-07-04 NOTE — Progress Notes (Signed)
Established Patient Office Visit  Subjective    Patient ID: Barbara Adkins, female    DOB: [birth year]  Age: 71 y.o. MRN: [id number]  CC:  Chief Complaint  Patient presents with   Weight Management Screening    HPI Barbara Adkins presents for weight management. Patient denies acute complaints.   Outpatient Encounter Medications as of 07/04/2023  Medication Sig   acetaminophen (TYLENOL) 325 MG tablet Take 2 tablets (650 mg total) by mouth every 6 (six) hours.   albuterol (VENTOLIN HFA) 108 (90 Base) MCG/ACT inhaler Inhale 2 puffs into the lungs every 4 (four) hours.   amLODipine (NORVASC) 10 MG tablet Take 1 tablet (10 mg total) by mouth daily.   atorvastatin (LIPITOR) 10 MG tablet Atorvastatin Calcium   fluticasone (FLONASE) 50 MCG/ACT nasal spray Place 2 sprays into both nostrils daily.   hydrochlorothiazide (HYDRODIURIL) 25 MG tablet TAKE 1 TABLET BY MOUTH DAILY   loratadine (CLARITIN) 5 MG chewable tablet Claritin   losartan (COZAAR) 100 MG tablet TAKE 1 TABLET BY MOUTH EVERY DAY   meloxicam (MOBIC) 7.5 MG tablet TAKE 1 TABLET BY MOUTH DAILY   metFORMIN (GLUCOPHAGE) 500 MG tablet TAKE 1 TABLET BY MOUTH DAILY  WITH BREAKFAST   omeprazole (PRILOSEC) 20 MG capsule Take 1 capsule (20 mg total) by mouth daily.   phentermine 37.5 MG capsule Take 1 capsule (37.5 mg total) by mouth every morning.   [DISCONTINUED] influenza vaccine adjuvanted (FLUAD QUADRIVALENT) 0.5 ML injection Inject into the muscle.   atorvastatin (LIPITOR) 20 MG tablet TAKE 1 TABLET BY MOUTH ONCE  DAILY   Blood Pressure Monitoring (BLOOD PRESSURE KIT) DEVI 1 Device by Does not apply route 3 (three) times a week. Use to check blood pressure three times a week. ICD10 I10   budesonide-formoterol (SYMBICORT) 80-4.5 MCG/ACT inhaler Symbicort   budesonide-formoterol (SYMBICORT) 80-4.5 MCG/ACT inhaler Inhale 2 puffs into the lungs in the morning and at bedtime.   cetirizine (ZYRTEC) 10 MG tablet Take 1 tablet  (10 mg total) by mouth daily.   ketorolac (ACULAR) 0.5 % ophthalmic solution Place 1 drop into the left eye 4 (four) times daily. (Patient not taking: Reported on 07/04/2023)   latanoprost (XALATAN) 0.005 % ophthalmic solution Place 1 drop into both eyes nightly. (Patient not taking: Reported on 07/04/2023)   latanoprost (XALATAN) 0.005 % ophthalmic solution Place 1 drop into both eyes nightly. (Patient not taking: Reported on 07/04/2023)   montelukast (SINGULAIR) 10 MG tablet TAKE 1 TABLET BY MOUTH AT  BEDTIME   prednisoLONE acetate (PRED FORTE) 1 % ophthalmic suspension Place 1 drop into the left eye 4 (four) times daily. Start using day after surgery as instructed (Patient not taking: Reported on 07/04/2023)   Timolol-Dorzolamid-Latanoprost 0.5-0.15-0.005 % SOLN Apply to eye. (Patient not taking: Reported on 07/04/2023)   [DISCONTINUED] fluticasone (FLONASE ALLERGY RELIEF) 50 MCG/ACT nasal spray Flonase   [DISCONTINUED] fluticasone (FLONASE) 50 MCG/ACT nasal spray Place 2 sprays into both nostrils daily.   [DISCONTINUED] influenza vaccine adjuvanted (FLUAD) 0.5 ML injection Inject 0.5 mLs into the muscle.   [DISCONTINUED] molnupiravir EUA (LAGEVRIO) 200 MG CAPS capsule 4 capsules   [DISCONTINUED] omeprazole (PRILOSEC) 10 MG capsule Take 2 capsules (20 mg total) by mouth daily.   No facility-administered encounter medications on file as of 07/04/2023.    Past Medical History:  Diagnosis Date   Allergy    Arthritis    Deviated septum    Diabetes mellitus without complication (HCC)    GERD (gastroesophageal reflux disease)  Hypertension     Past Surgical History:  Procedure Laterality Date   ETHMOIDECTOMY Right 05/17/2018   Procedure: RIGHT ETHMOIDECTOMY WITH TISSUE REMOVAL;  Surgeon: Newman Pies, MD;  Location: [facility];  Service: ENT;  Laterality: Right;   FRONTAL SINUS EXPLORATION Right 05/17/2018   Procedure: RIGHT FRONTAL RECESS EXPLORATION;  Surgeon: Newman Pies, MD;   Location: [facility];  Service: ENT;  Laterality: Right;   MAXILLARY ANTROSTOMY Right 05/17/2018   Procedure: RIGHT MAXILLARY ANTROSTOMY WITH TISSUE REMOVAL;  Surgeon: Newman Pies, MD;  Location: [facility];  Service: ENT;  Laterality: Right;   NASAL SEPTOPLASTY W/ TURBINOPLASTY Bilateral 05/17/2018   Procedure: NASAL SEPTOPLASTY WITH TURBINATE REDUCTION;  Surgeon: Newman Pies, MD;  Location: [facility];  Service: ENT;  Laterality: Bilateral;   SINUS ENDO WITH FUSION N/A 05/17/2018   Procedure: SINUS ENDO WITH FUSION;  Surgeon: Newman Pies, MD;  Location: [facility];  Service: ENT;  Laterality: N/A;   SPHENOIDECTOMY Right 05/17/2018   Procedure: RIGHT SPHENOIDECTOMY WITH TISSUE REMOVAL;  Surgeon: Newman Pies, MD;  Location:  S[facility];  Service: ENT;  Laterality: Right;   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   Procedure: TOTAL HIP ARTHROPLASTY;  Surgeon: Valeria Batman, MD;  Location: [facility];  Service: Orthopedics;  Laterality: Left;   TUBAL LIGATION      Family History  Problem Relation Age of Onset   Asthma Other    Cancer Other    Hypertension Other    Diabetes Other    Colon cancer Maternal Grandmother    Breast cancer Neg Hx    Esophageal cancer Neg Hx    Rectal cancer Neg Hx    Stomach cancer Neg Hx     Social History   Socioeconomic History   Marital status: Widowed    Spouse name: Not on file   Number of children: Not on file   Years of education: Not on file   Highest education level: Some college, no degree  Occupational History   Not on file  Tobacco Use   Smoking status: Never   Smokeless tobacco: Never  Vaping Use   Vaping status: Never Used  Substance and Sexual Activity   Alcohol use: No   Drug use: Never   Sexual activity: Not on file  Other Topics Concern   Not on file  Social History Narrative   Not on file   Social Determinants of Health   Financial Resource Strain: Medium Risk  (03/19/2023)   Overall Financial Resource Strain (CARDIA)    Difficulty of Paying Living Expenses: Somewhat hard  Food Insecurity: No Food Insecurity (03/19/2023)   Hunger Vital Sign    Worried About Running Out of Food in the Last Year: Never true    Ran Out of Food in the Last Year: Never true  Transportation Needs: No Transportation Needs (03/19/2023)   PRAPARE - Administrator, Civil Service (Medical): No    Lack of Transportation (Non-Medical): No  Physical Activity: Insufficiently Active (03/19/2023)   Exercise Vital Sign    Days of Exercise per Week: 3 days    Minutes of Exercise per Session: 20 min  Stress: No Stress Concern Present (03/19/2023)   [employer] of Occupational Health - Occupational Stress Questionnaire    Feeling of Stress : Not at all  Social Connections: Moderately Integrated (03/19/2023)   Social Connection and Isolation Panel [NHANES]    Frequency of Communication with Friends  and Family: More than three times a week    Frequency of Social Gatherings with Friends and Family: More than three times a week    Attends Religious Services: More than 4 times per year    Active Member of Clubs or Organizations: No    Attends Engineer, structural: More than 4 times per year    Marital Status: Widowed  Intimate Partner Violence: Unknown (02/11/2023)   Humiliation, Afraid, Rape, and Kick questionnaire    Fear of Current or Ex-Partner: No    Emotionally Abused: No    Physically Abused: Not on file    Sexually Abused: No    Review of Systems  All other systems reviewed and are negative.       Objective    BP 103/70 (BP Location: Right Arm, Patient Position: Sitting, Cuff Size: Normal)   Pulse 70   Temp 98.2 F (36.8 C) (Oral)   Resp 16   Ht 5\' 3"  (1.6 m)   Wt 158 lb (71.7 kg)   SpO2 98%   BMI 27.99 kg/m   Physical Exam Vitals and nursing note reviewed.  Constitutional:      General: She is not in acute distress. Cardiovascular:      Rate and Rhythm: Normal rate and regular rhythm.  Pulmonary:     Effort: Pulmonary effort is normal.     Breath sounds: Normal breath sounds.  Abdominal:     Palpations: Abdomen is soft.     Tenderness: There is no abdominal tenderness.  Neurological:     General: No focal deficit present.     Mental Status: She is alert and oriented to person, place, and time.     Deep Tendon Reflexes: Abnormal reflex: .diagmed.         Assessment & Plan:   1. Encounter for weight management Patient lost only one pound She is not considered obese at this time will defer refilling phentermne at this time   2. Overweight (BMI 25.0-29.9)     Return if symptoms worsen or fail to improve.   Tommie Raymond, MD

## 2023-07-04 NOTE — Progress Notes (Signed)
F/u with weight management

## 2023-07-28 ENCOUNTER — Ambulatory Visit: Payer: Medicare Other

## 2023-07-29 ENCOUNTER — Ambulatory Visit
Admission: RE | Admit: 2023-07-29 | Discharge: 2023-07-29 | Disposition: A | Discharge status: Home / Self Care | Payer: Medicare Other | Source: Ambulatory Visit | Attending: Family Medicine | Admitting: Family Medicine

## 2023-07-29 DIAGNOSIS — Z1231 Encounter for screening mammogram for malignant neoplasm of breast: Secondary | ICD-10-CM

## 2023-08-08 ENCOUNTER — Other Ambulatory Visit: Payer: Self-pay | Admitting: Family Medicine

## 2023-08-08 ENCOUNTER — Ambulatory Visit: Payer: Medicare Other | Admitting: Family Medicine

## 2023-08-16 ENCOUNTER — Other Ambulatory Visit: Payer: Self-pay | Admitting: Nurse Practitioner

## 2023-08-16 DIAGNOSIS — Z780 Asymptomatic menopausal state: Secondary | ICD-10-CM

## 2023-08-17 ENCOUNTER — Other Ambulatory Visit: Payer: Self-pay | Admitting: Family Medicine

## 2023-08-18 NOTE — Telephone Encounter (Signed)
Requested Prescriptions  Pending Prescriptions Disp Refills   hydrochlorothiazide (HYDRODIURIL) 25 MG tablet [Pharmacy Med Name: hydroCHLOROthiazide 25 MG Oral Tablet] 90 tablet 1    Sig: TAKE 1 TABLET BY MOUTH DAILY     Cardiovascular: Diuretics - Thiazide Failed - 08/17/2023 10:36 PM      Failed - Cr in normal range and within 180 days    Creatinine, Ser  Date Value Ref Range Status  02/11/2023 1.48 (H) 0.57 - 1.00 mg/dL Final         Failed - K in normal range and within 180 days    Potassium  Date Value Ref Range Status  02/11/2023 4.3 3.5 - 5.2 mmol/L Final         Failed - Na in normal range and within 180 days    Sodium  Date Value Ref Range Status  02/11/2023 142 134 - 144 mmol/L Final         Passed - Last BP in normal range    BP Readings from Last 1 Encounters:  07/04/23 103/70         Passed - Valid encounter within last 6 months    Recent Outpatient Visits           1 month ago Encounter for weight management   [facility], MD   2 months ago Encounter for weight management   [facility], MD   3 months ago Encounter for weight management   [facility], MD   4 months ago Encounter for weight management   [facility], MD   6 months ago Encounter for Medicare annual wellness exam   [facility], MD

## 2023-09-13 ENCOUNTER — Ambulatory Visit (INDEPENDENT_AMBULATORY_CARE_PROVIDER_SITE_OTHER): Payer: Medicare Other | Admitting: Family Medicine

## 2023-09-13 ENCOUNTER — Other Ambulatory Visit (HOSPITAL_COMMUNITY): Payer: Self-pay

## 2023-09-13 ENCOUNTER — Encounter: Payer: Self-pay | Admitting: Family Medicine

## 2023-09-13 VITALS — BP 109/71 | HR 67 | Temp 98.6°F | Resp 18 | Ht 62.0 in | Wt 163.2 lb

## 2023-09-13 DIAGNOSIS — I10 Essential (primary) hypertension: Secondary | ICD-10-CM | POA: Diagnosis not present

## 2023-09-13 DIAGNOSIS — E663 Overweight: Secondary | ICD-10-CM | POA: Diagnosis not present

## 2023-09-13 DIAGNOSIS — Z6829 Body mass index (BMI) 29.0-29.9, adult: Secondary | ICD-10-CM

## 2023-09-13 DIAGNOSIS — Z7689 Persons encountering health services in other specified circumstances: Secondary | ICD-10-CM

## 2023-09-13 DIAGNOSIS — Z5986 Financial insecurity: Secondary | ICD-10-CM

## 2023-09-13 DIAGNOSIS — Z7984 Long term (current) use of oral hypoglycemic drugs: Secondary | ICD-10-CM | POA: Diagnosis not present

## 2023-09-13 MED ORDER — TRAMADOL HCL 50 MG PO TABS
50.0000 mg | ORAL_TABLET | Freq: Three times a day (TID) | ORAL | 2 refills | Status: DC | PRN
  Filled 2023-09-13: qty 15, 5d supply, fill #0

## 2023-09-13 MED ORDER — PHENTERMINE HCL 37.5 MG PO CAPS
37.5000 mg | ORAL_CAPSULE | ORAL | 0 refills | Status: DC
  Filled 2023-09-13: qty 30, 30d supply, fill #0

## 2023-09-14 ENCOUNTER — Encounter: Payer: Self-pay | Admitting: Family Medicine

## 2023-09-14 NOTE — Progress Notes (Signed)
Established Patient Office Visit  Subjective    Patient ID: Barbara Adkins, female    DOB: 01/25/1951  Age: 72 y.o. MRN: [id number]  CC:  Chief Complaint  Patient presents with   Follow-up    Weight check    HPI Barbara Adkins presents for weight management and for follow up of hypertension.   Outpatient Encounter Medications as of 09/13/2023  Medication Sig   acetaminophen (TYLENOL) 325 MG tablet Take 2 tablets (650 mg total) by mouth every 6 (six) hours.   albuterol (VENTOLIN HFA) 108 (90 Base) MCG/ACT inhaler Inhale 2 puffs into the lungs every 4 (four) hours.   amLODipine (NORVASC) 10 MG tablet Take 1 tablet (10 mg total) by mouth daily.   atorvastatin (LIPITOR) 10 MG tablet Atorvastatin Calcium   atorvastatin (LIPITOR) 20 MG tablet TAKE 1 TABLET BY MOUTH ONCE  DAILY   Blood Pressure Monitoring (BLOOD PRESSURE KIT) DEVI 1 Device by Does not apply route 3 (three) times a week. Use to check blood pressure three times a week. ICD10 I10   budesonide-formoterol (SYMBICORT) 80-4.5 MCG/ACT inhaler Symbicort   cetirizine (ZYRTEC) 10 MG tablet Take 1 tablet (10 mg total) by mouth daily.   fluticasone (FLONASE) 50 MCG/ACT nasal spray Place 2 sprays into both nostrils daily.   hydrochlorothiazide (HYDRODIURIL) 25 MG tablet TAKE 1 TABLET BY MOUTH DAILY   ketorolac (ACULAR) 0.5 % ophthalmic solution Place 1 drop into the left eye 4 (four) times daily.   latanoprost (XALATAN) 0.005 % ophthalmic solution Place 1 drop into both eyes nightly.   latanoprost (XALATAN) 0.005 % ophthalmic solution    loratadine (CLARITIN) 5 MG chewable tablet Claritin   losartan (COZAAR) 100 MG tablet TAKE 1 TABLET BY MOUTH EVERY DAY   meloxicam (MOBIC) 7.5 MG tablet TAKE 1 TABLET BY MOUTH DAILY   metFORMIN (GLUCOPHAGE) 500 MG tablet TAKE 1 TABLET BY MOUTH DAILY  WITH BREAKFAST   montelukast (SINGULAIR) 10 MG tablet TAKE 1 TABLET BY MOUTH AT  BEDTIME   omeprazole (PRILOSEC) 20 MG capsule Take 1  capsule (20 mg total) by mouth daily.   prednisoLONE acetate (PRED FORTE) 1 % ophthalmic suspension Place 1 drop into the left eye 4 (four) times daily. Start using day after surgery as instructed   SYMBICORT 80-4.5 MCG/ACT inhaler INHALE 2 INHALATIONS BY MOUTH IN THE MORNING AND AT BEDTIME   Timolol-Dorzolamid-Latanoprost 0.5-0.15-0.005 % SOLN Apply to eye.   traMADol (ULTRAM) 50 MG tablet Take 1 tablet (50 mg total) by mouth every 8 (eight) hours as needed.   [DISCONTINUED] phentermine 37.5 MG capsule Take 1 capsule (37.5 mg total) by mouth every morning.   phentermine 37.5 MG capsule Take 1 capsule (37.5 mg total) by mouth every morning.   No facility-administered encounter medications on file as of 09/13/2023.    Past Medical History:  Diagnosis Date   Allergy    Arthritis    Deviated septum    Diabetes mellitus without complication (HCC)    GERD (gastroesophageal reflux disease)    Hypertension     Past Surgical History:  Procedure Laterality Date   ETHMOIDECTOMY Right 05/17/2018   Procedure: RIGHT ETHMOIDECTOMY WITH TISSUE REMOVAL;  Surgeon: Newman Pies, MD;  Location: [facility];  Service: ENT;  Laterality: Right;   FRONTAL SINUS EXPLORATION Right 05/17/2018   Procedure: RIGHT FRONTAL RECESS EXPLORATION;  Surgeon: Newman Pies, MD;  Location: [facility];  Service: ENT;  Laterality: Right;   MAXILLARY ANTROSTOMY Right 05/17/2018  Procedure: RIGHT MAXILLARY ANTROSTOMY WITH TISSUE REMOVAL;  Surgeon: Newman Pies, MD;  Location: [facility];  Service: ENT;  Laterality: Right;   NASAL SEPTOPLASTY W/ TURBINOPLASTY Bilateral 05/17/2018   Procedure: NASAL SEPTOPLASTY WITH TURBINATE REDUCTION;  Surgeon: Newman Pies, MD;  Location: [facility];  Service: ENT;  Laterality: Bilateral;   SINUS ENDO WITH FUSION N/A 05/17/2018   Procedure: SINUS ENDO WITH FUSION;  Surgeon: Newman Pies, MD;  Location: [facility];  Service: ENT;  Laterality: N/A;    SPHENOIDECTOMY Right 05/17/2018   Procedure: RIGHT SPHENOIDECTOMY WITH TISSUE REMOVAL;  Surgeon: Newman Pies, MD;  Location:  [facility];  Service: ENT;  Laterality: Right;   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   Procedure: TOTAL HIP ARTHROPLASTY;  Surgeon: Valeria Batman, MD;  Location: [facility];  Service: Orthopedics;  Laterality: Left;   TUBAL LIGATION      Family History  Problem Relation Age of Onset   Asthma Other    Cancer Other    Hypertension Other    Diabetes Other    Colon cancer Maternal Grandmother    Breast cancer Neg Hx    Esophageal cancer Neg Hx    Rectal cancer Neg Hx    Stomach cancer Neg Hx     Social History   Socioeconomic History   Marital status: Widowed    Spouse name: Not on file   Number of children: Not on file   Years of education: Not on file   Highest education level: Some college, no degree  Occupational History   Not on file  Tobacco Use   Smoking status: Never   Smokeless tobacco: Never  Vaping Use   Vaping status: Never Used  Substance and Sexual Activity   Alcohol use: No   Drug use: Never   Sexual activity: Not on file  Other Topics Concern   Not on file  Social History Narrative   Not on file   Social Determinants of Health   Financial Resource Strain: Medium Risk (09/09/2023)   Overall Financial Resource Strain (CARDIA)    Difficulty of Paying Living Expenses: Somewhat hard  Food Insecurity: Food Insecurity Present (09/09/2023)   Hunger Vital Sign    Worried About Running Out of Food in the Last Year: Sometimes true    Ran Out of Food in the Last Year: Never true  Transportation Needs: No Transportation Needs (09/09/2023)   PRAPARE - Administrator, Civil Service (Medical): No    Lack of Transportation (Non-Medical): No  Physical Activity: Insufficiently Active (09/09/2023)   Exercise Vital Sign    Days of Exercise per Week: 4 days    Minutes of Exercise per Session: 20 min  Stress: No Stress  Concern Present (09/09/2023)   [employer] of Occupational Health - Occupational Stress Questionnaire    Feeling of Stress : Not at all  Social Connections: Moderately Integrated (09/09/2023)   Social Connection and Isolation Panel [NHANES]    Frequency of Communication with Friends and Family: More than three times a week    Frequency of Social Gatherings with Friends and Family: More than three times a week    Attends Religious Services: More than 4 times per year    Active Member of Clubs or Organizations: Patient declined    Attends Engineer, structural: More than 4 times per year    Marital Status: Widowed  Intimate Partner Violence: Unknown (02/11/2023)   Humiliation, Afraid, Rape, and Kick  questionnaire    Fear of Current or Ex-Partner: No    Emotionally Abused: No    Physically Abused: Not on file    Sexually Abused: No    Review of Systems  All other systems reviewed and are negative.       Objective    BP 109/71 (BP Location: Right Arm, Patient Position: Sitting, Cuff Size: Normal)   Pulse 67   Temp 98.6 F (37 C) (Oral)   Resp 18   Ht 5\' 2"  (1.575 m)   Wt 163 lb 3.2 oz (74 kg)   SpO2 98%   BMI 29.85 kg/m   Physical Exam Vitals and nursing note reviewed.  Constitutional:      General: She is not in acute distress. Cardiovascular:     Rate and Rhythm: Normal rate and regular rhythm.  Pulmonary:     Effort: Pulmonary effort is normal.     Breath sounds: Normal breath sounds.  Abdominal:     Palpations: Abdomen is soft.     Tenderness: There is no abdominal tenderness.  Neurological:     General: No focal deficit present.     Mental Status: She is alert and oriented to person, place, and time.         Assessment & Plan:   Essential hypertension -     CMP14+EGFR  Overweight (BMI 25.0-29.9)  Encounter for weight management  Other orders -     Phentermine HCl; Take 1 capsule (37.5 mg total) by mouth every morning.  Dispense: 30  capsule; Refill: 0 -     traMADol HCl; Take 1 tablet (50 mg total) by mouth every 8 (eight) hours as needed.  Dispense: 30 tablet; Refill: 2     Return in about 4 weeks (around 10/11/2023) for follow up.   Tommie Raymond, MD

## 2023-10-08 ENCOUNTER — Other Ambulatory Visit: Payer: Self-pay | Admitting: Family Medicine

## 2023-10-18 ENCOUNTER — Other Ambulatory Visit: Payer: Self-pay | Admitting: Family Medicine

## 2023-10-20 ENCOUNTER — Ambulatory Visit: Payer: Medicare Other | Admitting: Family Medicine

## 2023-10-29 ENCOUNTER — Other Ambulatory Visit: Payer: Self-pay | Admitting: Family Medicine

## 2023-11-01 ENCOUNTER — Ambulatory Visit (INDEPENDENT_AMBULATORY_CARE_PROVIDER_SITE_OTHER): Payer: Medicare Other | Admitting: Family Medicine

## 2023-11-01 ENCOUNTER — Encounter: Payer: Self-pay | Admitting: Family Medicine

## 2023-11-01 VITALS — BP 112/71 | HR 68 | Temp 98.1°F | Resp 16 | Ht 62.0 in | Wt 164.2 lb

## 2023-11-01 DIAGNOSIS — E66811 Obesity, class 1: Secondary | ICD-10-CM | POA: Diagnosis not present

## 2023-11-01 DIAGNOSIS — Z7689 Persons encountering health services in other specified circumstances: Secondary | ICD-10-CM | POA: Diagnosis not present

## 2023-11-01 DIAGNOSIS — M1612 Unilateral primary osteoarthritis, left hip: Secondary | ICD-10-CM | POA: Diagnosis not present

## 2023-11-01 DIAGNOSIS — Z6830 Body mass index (BMI) 30.0-30.9, adult: Secondary | ICD-10-CM

## 2023-11-01 DIAGNOSIS — E6609 Other obesity due to excess calories: Secondary | ICD-10-CM

## 2023-11-01 MED ORDER — TRAMADOL HCL 50 MG PO TABS: 50.0000 mg | ORAL_TABLET | Freq: Two times a day (BID) | ORAL | 0 refills | Status: DC | PRN

## 2023-11-01 MED ORDER — PHENTERMINE HCL 37.5 MG PO CAPS: 37.5000 mg | ORAL_CAPSULE | ORAL | 0 refills | Status: DC

## 2023-11-03 ENCOUNTER — Encounter: Payer: Self-pay | Admitting: Family Medicine

## 2023-11-03 NOTE — Progress Notes (Signed)
Established Patient Office Visit  Subjective    Patient ID: Barbara Adkins, female    DOB: October 06, 1951  Age: 72 y.o. MRN: [id number]  CC:  Chief Complaint  Patient presents with   paperwork    Medication refill    HPI Barbara Adkins presents for routine weight management. Patient reports that she is finding it difficult to navigate her weight loss during the holidays. She also reports some joint pain with change of the weather.   Outpatient Encounter Medications as of 11/01/2023  Medication Sig   acetaminophen (TYLENOL) 325 MG tablet Take 2 tablets (650 mg total) by mouth every 6 (six) hours.   albuterol (VENTOLIN HFA) 108 (90 Base) MCG/ACT inhaler Inhale 2 puffs into the lungs every 4 (four) hours.   amLODipine (NORVASC) 10 MG tablet Take 1 tablet (10 mg total) by mouth daily.   atorvastatin (LIPITOR) 10 MG tablet Atorvastatin Calcium   atorvastatin (LIPITOR) 20 MG tablet TAKE 1 TABLET BY MOUTH ONCE  DAILY   Blood Pressure Monitoring (BLOOD PRESSURE KIT) DEVI 1 Device by Does not apply route 3 (three) times a week. Use to check blood pressure three times a week. ICD10 I10   budesonide-formoterol (SYMBICORT) 80-4.5 MCG/ACT inhaler Symbicort   cetirizine (ZYRTEC) 10 MG tablet Take 1 tablet (10 mg total) by mouth daily.   fluticasone (FLONASE) 50 MCG/ACT nasal spray Place 2 sprays into both nostrils daily.   hydrochlorothiazide (HYDRODIURIL) 25 MG tablet TAKE 1 TABLET BY MOUTH DAILY   ketorolac (ACULAR) 0.5 % ophthalmic solution Place 1 drop into the left eye 4 (four) times daily.   latanoprost (XALATAN) 0.005 % ophthalmic solution Place 1 drop into both eyes nightly.   latanoprost (XALATAN) 0.005 % ophthalmic solution    loratadine (CLARITIN) 5 MG chewable tablet Claritin   losartan (COZAAR) 100 MG tablet TAKE 1 TABLET BY MOUTH EVERY DAY   meloxicam (MOBIC) 7.5 MG tablet TAKE 1 TABLET BY MOUTH DAILY   metFORMIN (GLUCOPHAGE) 500 MG tablet TAKE 1 TABLET BY MOUTH DAILY   WITH BREAKFAST   montelukast (SINGULAIR) 10 MG tablet TAKE 1 TABLET BY MOUTH AT  BEDTIME   omeprazole (PRILOSEC) 20 MG capsule Take 1 capsule (20 mg total) by mouth daily.   prednisoLONE acetate (PRED FORTE) 1 % ophthalmic suspension Place 1 drop into the left eye 4 (four) times daily. Start using day after surgery as instructed   SYMBICORT 80-4.5 MCG/ACT inhaler INHALE 2 INHALATIONS BY MOUTH IN THE MORNING AND AT BEDTIME   Timolol-Dorzolamid-Latanoprost 0.5-0.15-0.005 % SOLN Apply to eye.   [DISCONTINUED] phentermine 37.5 MG capsule Take 1 capsule (37.5 mg total) by mouth every morning.   [DISCONTINUED] traMADol (ULTRAM) 50 MG tablet Take 1 tablet (50 mg total) by mouth every 8 (eight) hours as needed.   phentermine 37.5 MG capsule Take 1 capsule (37.5 mg total) by mouth every morning.   traMADol (ULTRAM) 50 MG tablet Take 1 tablet (50 mg total) by mouth 2 (two) times daily as needed for severe pain (pain score 7-10).   No facility-administered encounter medications on file as of 11/01/2023.    Past Medical History:  Diagnosis Date   Allergy    Arthritis    Deviated septum    Diabetes mellitus without complication (HCC)    GERD (gastroesophageal reflux disease)    Hypertension     Past Surgical History:  Procedure Laterality Date   ETHMOIDECTOMY Right 05/17/2018   Procedure: RIGHT ETHMOIDECTOMY WITH TISSUE REMOVAL;  Surgeon: Newman Pies, MD;  Location: [facility];  Service: ENT;  Laterality: Right;   FRONTAL SINUS EXPLORATION Right 05/17/2018   Procedure: RIGHT FRONTAL RECESS EXPLORATION;  Surgeon: Newman Pies, MD;  Location: [facility];  Service: ENT;  Laterality: Right;   MAXILLARY ANTROSTOMY Right 05/17/2018   Procedure: RIGHT MAXILLARY ANTROSTOMY WITH TISSUE REMOVAL;  Surgeon: Newman Pies, MD;  Location: [facility];  Service: ENT;  Laterality: Right;   NASAL SEPTOPLASTY W/ TURBINOPLASTY Bilateral 05/17/2018   Procedure: NASAL SEPTOPLASTY WITH  TURBINATE REDUCTION;  Surgeon: Newman Pies, MD;  Location: [facility];  Service: ENT;  Laterality: Bilateral;   SINUS ENDO WITH FUSION N/A 05/17/2018   Procedure: SINUS ENDO WITH FUSION;  Surgeon: Newman Pies, MD;  Location: [facility];  Service: ENT;  Laterality: N/A;   SPHENOIDECTOMY Right 05/17/2018   Procedure: RIGHT SPHENOIDECTOMY WITH TISSUE REMOVAL;  Surgeon: Newman Pies, MD;  Location:  [facility];  Service: ENT;  Laterality: Right;   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   Procedure: TOTAL HIP ARTHROPLASTY;  Surgeon: Valeria Batman, MD;  Location: [facility];  Service: Orthopedics;  Laterality: Left;   TUBAL LIGATION      Family History  Problem Relation Age of Onset   Asthma Other    Cancer Other    Hypertension Other    Diabetes Other    Colon cancer Maternal Grandmother    Breast cancer Neg Hx    Esophageal cancer Neg Hx    Rectal cancer Neg Hx    Stomach cancer Neg Hx     Social History   Socioeconomic History   Marital status: Widowed    Spouse name: Not on file   Number of children: Not on file   Years of education: Not on file   Highest education level: Some college, no degree  Occupational History   Not on file  Tobacco Use   Smoking status: Never   Smokeless tobacco: Never  Vaping Use   Vaping status: Never Used  Substance and Sexual Activity   Alcohol use: No   Drug use: Never   Sexual activity: Not on file  Other Topics Concern   Not on file  Social History Narrative   Not on file   Social Drivers of Health   Financial Resource Strain: Medium Risk (09/09/2023)   Overall Financial Resource Strain (CARDIA)    Difficulty of Paying Living Expenses: Somewhat hard  Food Insecurity: Food Insecurity Present (09/09/2023)   Hunger Vital Sign    Worried About Running Out of Food in the Last Year: Sometimes true    Ran Out of Food in the Last Year: Never true  Transportation Needs: No Transportation Needs (09/09/2023)    PRAPARE - Administrator, Civil Service (Medical): No    Lack of Transportation (Non-Medical): No  Physical Activity: Insufficiently Active (09/09/2023)   Exercise Vital Sign    Days of Exercise per Week: 4 days    Minutes of Exercise per Session: 20 min  Stress: No Stress Concern Present (09/09/2023)   [employer] of Occupational Health - Occupational Stress Questionnaire    Feeling of Stress : Not at all  Social Connections: Moderately Integrated (09/09/2023)   Social Connection and Isolation Panel [NHANES]    Frequency of Communication with Friends and Family: More than three times a week    Frequency of Social Gatherings with Friends and Family: More than three times a week    Attends Religious Services:  More than 4 times per year    Active Member of Clubs or Organizations: Patient declined    Attends Banker Meetings: More than 4 times per year    Marital Status: Widowed  Intimate Partner Violence: Unknown (02/11/2023)   Humiliation, Afraid, Rape, and Kick questionnaire    Fear of Current or Ex-Partner: No    Emotionally Abused: No    Physically Abused: Not on file    Sexually Abused: No    Review of Systems  All other systems reviewed and are negative.       Objective    BP 112/71 (BP Location: Right Arm, Patient Position: Sitting, Cuff Size: Normal)   Pulse 68   Temp 98.1 F (36.7 C) (Oral)   Resp 16   Ht 5\' 2"  (1.575 m)   Wt 164 lb 3.2 oz (74.5 kg)   SpO2 96%   BMI 30.03 kg/m   Physical Exam Vitals and nursing note reviewed.  Constitutional:      General: She is not in acute distress. Cardiovascular:     Rate and Rhythm: Normal rate and regular rhythm.  Pulmonary:     Effort: Pulmonary effort is normal.     Breath sounds: Normal breath sounds.  Abdominal:     Palpations: Abdomen is soft.     Tenderness: There is no abdominal tenderness.  Neurological:     General: No focal deficit present.     Mental Status: She is alert  and oriented to person, place, and time.         Assessment & Plan:   Class 1 obesity due to excess calories with serious comorbidity and body mass index (BMI) of 30.0 to 30.9 in adult  Encounter for weight management  Unilateral primary osteoarthritis, left hip  Other orders -     Phentermine HCl; Take 1 capsule (37.5 mg total) by mouth every morning.  Dispense: 30 capsule; Refill: 0 -     traMADol HCl; Take 1 tablet (50 mg total) by mouth 2 (two) times daily as needed for severe pain (pain score 7-10).  Dispense: 30 tablet; Refill: 0     Return in about 4 weeks (around 11/29/2023) for follow up.   Tommie Raymond, MD

## 2023-11-30 ENCOUNTER — Other Ambulatory Visit: Payer: Self-pay | Admitting: Family Medicine

## 2023-12-07 ENCOUNTER — Ambulatory Visit: Payer: Medicare Other | Admitting: Family Medicine

## 2023-12-27 ENCOUNTER — Telehealth: Payer: Self-pay | Admitting: Family

## 2023-12-27 ENCOUNTER — Encounter: Payer: Self-pay | Admitting: Family Medicine

## 2023-12-27 ENCOUNTER — Ambulatory Visit: Payer: Medicare Other | Admitting: Family Medicine

## 2023-12-27 VITALS — BP 100/66 | HR 78 | Temp 98.0°F | Resp 16 | Ht 62.5 in | Wt 162.4 lb

## 2023-12-27 DIAGNOSIS — Z7984 Long term (current) use of oral hypoglycemic drugs: Secondary | ICD-10-CM

## 2023-12-27 DIAGNOSIS — E785 Hyperlipidemia, unspecified: Secondary | ICD-10-CM | POA: Diagnosis not present

## 2023-12-27 DIAGNOSIS — I10 Essential (primary) hypertension: Secondary | ICD-10-CM

## 2023-12-27 DIAGNOSIS — E119 Type 2 diabetes mellitus without complications: Secondary | ICD-10-CM | POA: Diagnosis not present

## 2023-12-27 DIAGNOSIS — M1612 Unilateral primary osteoarthritis, left hip: Secondary | ICD-10-CM | POA: Diagnosis not present

## 2023-12-29 NOTE — Progress Notes (Signed)
 Established Patient Office Visit  Subjective    Patient ID: Barbara Adkins, female    DOB: 07-19-1951  Age: 73 y.o. MRN: [id number]  CC:  Chief Complaint  Patient presents with   Follow-up    HPI Barbara Adkins presents for routine follow up of chronic med issues including diabetes and hypertension. Patient reports med compliance and denies acute complaints.   Outpatient Encounter Medications as of 12/27/2023  Medication Sig   acetaminophen (TYLENOL) 325 MG tablet Take 2 tablets (650 mg total) by mouth every 6 (six) hours.   albuterol (VENTOLIN HFA) 108 (90 Base) MCG/ACT inhaler Inhale 2 puffs into the lungs every 4 (four) hours.   amLODipine (NORVASC) 10 MG tablet TAKE 1 TABLET BY MOUTH DAILY   atorvastatin (LIPITOR) 10 MG tablet Atorvastatin Calcium   atorvastatin (LIPITOR) 20 MG tablet TAKE 1 TABLET BY MOUTH ONCE  DAILY   Blood Pressure Monitoring (BLOOD PRESSURE KIT) DEVI 1 Device by Does not apply route 3 (three) times a week. Use to check blood pressure three times a week. ICD10 I10   budesonide-formoterol (SYMBICORT) 80-4.5 MCG/ACT inhaler Symbicort   cetirizine (ZYRTEC) 10 MG tablet Take 1 tablet (10 mg total) by mouth daily.   fluticasone (FLONASE) 50 MCG/ACT nasal spray Place 2 sprays into both nostrils daily.   hydrochlorothiazide (HYDRODIURIL) 25 MG tablet TAKE 1 TABLET BY MOUTH DAILY   ketorolac (ACULAR) 0.5 % ophthalmic solution Place 1 drop into the left eye 4 (four) times daily.   latanoprost (XALATAN) 0.005 % ophthalmic solution Place 1 drop into both eyes nightly.   latanoprost (XALATAN) 0.005 % ophthalmic solution    loratadine (CLARITIN) 5 MG chewable tablet Claritin   losartan (COZAAR) 100 MG tablet TAKE 1 TABLET BY MOUTH EVERY DAY   meloxicam (MOBIC) 7.5 MG tablet TAKE 1 TABLET BY MOUTH DAILY   metFORMIN (GLUCOPHAGE) 500 MG tablet TAKE 1 TABLET BY MOUTH DAILY  WITH BREAKFAST   montelukast (SINGULAIR) 10 MG tablet TAKE 1 TABLET BY MOUTH AT  BEDTIME    omeprazole (PRILOSEC) 20 MG capsule Take 1 capsule (20 mg total) by mouth daily.   phentermine 37.5 MG capsule Take 1 capsule (37.5 mg total) by mouth every morning.   prednisoLONE acetate (PRED FORTE) 1 % ophthalmic suspension Place 1 drop into the left eye 4 (four) times daily. Start using day after surgery as instructed   SYMBICORT 80-4.5 MCG/ACT inhaler INHALE 2 INHALATIONS BY MOUTH IN THE MORNING AND AT BEDTIME   Timolol-Dorzolamid-Latanoprost 0.5-0.15-0.005 % SOLN Apply to eye.   traMADol (ULTRAM) 50 MG tablet Take 1 tablet (50 mg total) by mouth 2 (two) times daily as needed for severe pain (pain score 7-10).   No facility-administered encounter medications on file as of 12/27/2023.    Past Medical History:  Diagnosis Date   Allergy    Arthritis    Deviated septum    Diabetes mellitus without complication (HCC)    GERD (gastroesophageal reflux disease)    Hypertension     Past Surgical History:  Procedure Laterality Date   ETHMOIDECTOMY Right 05/17/2018   Procedure: RIGHT ETHMOIDECTOMY WITH TISSUE REMOVAL;  Surgeon: Newman Pies, MD;  Location: [facility];  Service: ENT;  Laterality: Right;   FRONTAL SINUS EXPLORATION Right 05/17/2018   Procedure: RIGHT FRONTAL RECESS EXPLORATION;  Surgeon: Newman Pies, MD;  Location: [facility];  Service: ENT;  Laterality: Right;   MAXILLARY ANTROSTOMY Right 05/17/2018   Procedure: RIGHT MAXILLARY ANTROSTOMY WITH TISSUE REMOVAL;  Surgeon: Newman Pies, MD;  Location: [facility];  Service: ENT;  Laterality: Right;   NASAL SEPTOPLASTY W/ TURBINOPLASTY Bilateral 05/17/2018   Procedure: NASAL SEPTOPLASTY WITH TURBINATE REDUCTION;  Surgeon: Newman Pies, MD;  Location: [facility];  Service: ENT;  Laterality: Bilateral;   SINUS ENDO WITH FUSION N/A 05/17/2018   Procedure: SINUS ENDO WITH FUSION;  Surgeon: Newman Pies, MD;  Location: [facility];  Service: ENT;  Laterality: N/A;   SPHENOIDECTOMY Right  05/17/2018   Procedure: RIGHT SPHENOIDECTOMY WITH TISSUE REMOVAL;  Surgeon: Newman Pies, MD;  Location: [facility];  Service: ENT;  Laterality: Right;   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   Procedure: TOTAL HIP ARTHROPLASTY;  Surgeon: Valeria Batman, MD;  Location: [facility];  Service: Orthopedics;  Laterality: Left;   TUBAL LIGATION      Family History  Problem Relation Age of Onset   Asthma Other    Cancer Other    Hypertension Other    Diabetes Other    Colon cancer Maternal Grandmother    Breast cancer Neg Hx    Esophageal cancer Neg Hx    Rectal cancer Neg Hx    Stomach cancer Neg Hx     Social History   Socioeconomic History   Marital status: Widowed    Spouse name: Not on file   Number of children: Not on file   Years of education: Not on file   Highest education level: Some college, no degree  Occupational History   Not on file  Tobacco Use   Smoking status: Never   Smokeless tobacco: Never  Vaping Use   Vaping status: Never Used  Substance and Sexual Activity   Alcohol use: No   Drug use: Never   Sexual activity: Not on file  Other Topics Concern   Not on file  Social History Narrative   Not on file   Social Drivers of Health   Financial Resource Strain: Medium Risk (09/09/2023)   Overall Financial Resource Strain (CARDIA)    Difficulty of Paying Living Expenses: Somewhat hard  Food Insecurity: Food Insecurity Present (09/09/2023)   Hunger Vital Sign    Worried About Running Out of Food in the Last Year: Sometimes true    Ran Out of Food in the Last Year: Never true  Transportation Needs: No Transportation Needs (09/09/2023)   PRAPARE - Administrator, Civil Service (Medical): No    Lack of Transportation (Non-Medical): No  Physical Activity: Insufficiently Active (09/09/2023)   Exercise Vital Sign    Days of Exercise per Week: 4 days    Minutes of Exercise per Session: 20 min  Stress: No Stress Concern Present (09/09/2023)    [employer] of Occupational Health - Occupational Stress Questionnaire    Feeling of Stress : Not at all  Social Connections: Moderately Integrated (09/09/2023)   Social Connection and Isolation Panel [NHANES]    Frequency of Communication with Friends and Family: More than three times a week    Frequency of Social Gatherings with Friends and Family: More than three times a week    Attends Religious Services: More than 4 times per year    Active Member of Clubs or Organizations: Patient declined    Attends Engineer, structural: More than 4 times per year    Marital Status: Widowed  Intimate Partner Violence: Unknown (02/11/2023)   Humiliation, Afraid, Rape, and Kick questionnaire    Fear of Current or  Ex-Partner: No    Emotionally Abused: No    Physically Abused: Not on file    Sexually Abused: No    Review of Systems  All other systems reviewed and are negative.       Objective    BP 100/66 (BP Location: Right Arm, Patient Position: Sitting, Cuff Size: Normal)   Pulse 78   Temp 98 F (36.7 C) (Oral)   Resp 16   Ht 5' 2.5" (1.588 m)   Wt 162 lb 6.4 oz (73.7 kg)   SpO2 97%   BMI 29.23 kg/m   Physical Exam Vitals and nursing note reviewed.  Constitutional:      General: She is not in acute distress. Cardiovascular:     Rate and Rhythm: Normal rate and regular rhythm.  Pulmonary:     Effort: Pulmonary effort is normal.     Breath sounds: Normal breath sounds.  Abdominal:     Palpations: Abdomen is soft.     Tenderness: There is no abdominal tenderness.  Neurological:     General: No focal deficit present.     Mental Status: She is alert and oriented to person, place, and time.         Assessment & Plan:   Type 2 diabetes mellitus without complication, without long-term current use of insulin (HCC)  Hyperlipidemia, unspecified hyperlipidemia type  Unilateral primary osteoarthritis, left hip  Essential hypertension  Diabetes mellitus  treated with oral medication (HCC)   All are stable - Continue  Return in about 4 weeks (around 01/24/2024).   Tommie Raymond, MD

## 2023-12-29 NOTE — Telephone Encounter (Signed)
DR. Andrey Campanile is aware of papers

## 2024-01-02 ENCOUNTER — Encounter: Payer: Self-pay | Admitting: Family Medicine

## 2024-01-17 ENCOUNTER — Ambulatory Visit: Payer: Self-pay | Admitting: Family Medicine

## 2024-01-17 NOTE — Telephone Encounter (Signed)
 Chief Complaint: Difficulty breathing Symptoms: Chest pain with movement, swollen/sore throat, sometimes coughing, slight wheezing Frequency: Intermittent Pertinent Negatives: Patient denies dizziness, fever Disposition: [x] ED /[] Urgent Care (no appt availability in office) / [] Appointment(In office/virtual)/ []  [facility]/ [] Home Care/ [] Refused Recommended Disposition /[] [facility]/ []  Follow-up with PCP Additional Notes: Pt states the difficulty breathing started over the weekend. Pt states she has chest pain with movement. Pt states her throat is swollen and sore. Pt advised to go to ED. Pt states she has someone to take her there. This RN offered to call EMS for pt but pt declined. Pt verbalized understanding and agrees to plan.   Copied from CRM [id number]. Topic: Clinical - Red Word Triage >> Jan 17, 2024  9:38 AM Gildardo Pounds wrote: Red Word that prompted transfer to Nurse Triage: difficulty breathing, throat swollen. Callback number is [phone] Reason for Disposition  [1] MODERATE difficulty breathing (e.g., speaks in phrases, SOB even at rest, pulse 100-120) AND [2] NEW-onset or WORSE than normal  Answer Assessment - Initial Assessment Questions 1. RESPIRATORY STATUS: "Describe your breathing?" (e.g., wheezing, shortness of breath, unable to speak, severe coughing)      sometimes coughing, slight wheezing, "winded" with movement 2. ONSET: "When did this breathing problem begin?"      Over the weekend 3. PATTERN "Does the difficult breathing come and go, or has it been constant since it started?"      Intermittent- does well if sitting, movement causes it 4. RECURRENT SYMPTOM: "Have you had difficulty breathing before?" If Yes, ask: "When was the last time?" and "What happened that time?"      Yes, it has been a while 5. OTHER SYMPTOMS: "Do you have any other symptoms? (e.g., dizziness, runny nose, cough, chest pain, fever)     A little runny nose, cough, chest  pain with movement  Protocols used: Breathing Difficulty-A-AH

## 2024-01-26 ENCOUNTER — Encounter: Payer: Self-pay | Admitting: Family Medicine

## 2024-01-26 ENCOUNTER — Ambulatory Visit: Payer: Medicare Other | Admitting: Family Medicine

## 2024-01-26 VITALS — BP 120/81 | HR 67 | Temp 98.2°F | Resp 18 | Ht 62.5 in | Wt 159.4 lb

## 2024-01-26 DIAGNOSIS — J4530 Mild persistent asthma, uncomplicated: Secondary | ICD-10-CM | POA: Diagnosis not present

## 2024-01-26 DIAGNOSIS — Z7689 Persons encountering health services in other specified circumstances: Secondary | ICD-10-CM

## 2024-01-26 DIAGNOSIS — E663 Overweight: Secondary | ICD-10-CM | POA: Diagnosis not present

## 2024-01-26 DIAGNOSIS — Z6828 Body mass index (BMI) 28.0-28.9, adult: Secondary | ICD-10-CM

## 2024-01-26 MED ORDER — PHENTERMINE HCL 37.5 MG PO CAPS: 37.5000 mg | ORAL_CAPSULE | ORAL | 0 refills | Status: DC

## 2024-01-26 MED ORDER — ALBUTEROL SULFATE HFA 108 (90 BASE) MCG/ACT IN AERS: 2.0000 | INHALATION_SPRAY | RESPIRATORY_TRACT | 3 refills | Status: AC

## 2024-01-27 ENCOUNTER — Ambulatory Visit: Admitting: Family Medicine

## 2024-01-31 ENCOUNTER — Encounter: Payer: Self-pay | Admitting: Family Medicine

## 2024-01-31 NOTE — Progress Notes (Signed)
 Established Patient Office Visit  Subjective    Patient ID: Barbara Adkins, female    DOB: March 03, 1951  Age: 73 y.o. MRN: [id number]  CC:  Chief Complaint  Patient presents with   Follow-up    4 week, trouble breathing about three weeks ago, patient almost blacked out  too    HPI [employer] presents for routine weight management.   Outpatient Encounter Medications as of 01/26/2024  Medication Sig   acetaminophen (TYLENOL) 325 MG tablet Take 2 tablets (650 mg total) by mouth every 6 (six) hours.   amLODipine (NORVASC) 10 MG tablet TAKE 1 TABLET BY MOUTH DAILY   atorvastatin (LIPITOR) 10 MG tablet Atorvastatin Calcium   atorvastatin (LIPITOR) 20 MG tablet TAKE 1 TABLET BY MOUTH ONCE  DAILY   Blood Pressure Monitoring (BLOOD PRESSURE KIT) DEVI 1 Device by Does not apply route 3 (three) times a week. Use to check blood pressure three times a week. ICD10 I10   budesonide-formoterol (SYMBICORT) 80-4.5 MCG/ACT inhaler Symbicort   cetirizine (ZYRTEC) 10 MG tablet Take 1 tablet (10 mg total) by mouth daily.   fluticasone (FLONASE) 50 MCG/ACT nasal spray Place 2 sprays into both nostrils daily.   hydrochlorothiazide (HYDRODIURIL) 25 MG tablet TAKE 1 TABLET BY MOUTH DAILY   ketorolac (ACULAR) 0.5 % ophthalmic solution Place 1 drop into the left eye 4 (four) times daily.   latanoprost (XALATAN) 0.005 % ophthalmic solution Place 1 drop into both eyes nightly.   latanoprost (XALATAN) 0.005 % ophthalmic solution    loratadine (CLARITIN) 5 MG chewable tablet Claritin   losartan (COZAAR) 100 MG tablet TAKE 1 TABLET BY MOUTH EVERY DAY   meloxicam (MOBIC) 7.5 MG tablet TAKE 1 TABLET BY MOUTH DAILY   metFORMIN (GLUCOPHAGE) 500 MG tablet TAKE 1 TABLET BY MOUTH DAILY  WITH BREAKFAST   montelukast (SINGULAIR) 10 MG tablet TAKE 1 TABLET BY MOUTH AT  BEDTIME   omeprazole (PRILOSEC) 20 MG capsule Take 1 capsule (20 mg total) by mouth daily.   prednisoLONE acetate (PRED FORTE) 1 % ophthalmic  suspension Place 1 drop into the left eye 4 (four) times daily. Start using day after surgery as instructed   SYMBICORT 80-4.5 MCG/ACT inhaler INHALE 2 INHALATIONS BY MOUTH IN THE MORNING AND AT BEDTIME   Timolol-Dorzolamid-Latanoprost 0.5-0.15-0.005 % SOLN Apply to eye.   traMADol (ULTRAM) 50 MG tablet Take 1 tablet (50 mg total) by mouth 2 (two) times daily as needed for severe pain (pain score 7-10).   [DISCONTINUED] albuterol (VENTOLIN HFA) 108 (90 Base) MCG/ACT inhaler Inhale 2 puffs into the lungs every 4 (four) hours.   [DISCONTINUED] phentermine 37.5 MG capsule Take 1 capsule (37.5 mg total) by mouth every morning.   albuterol (VENTOLIN HFA) 108 (90 Base) MCG/ACT inhaler Inhale 2 puffs into the lungs every 4 (four) hours.   phentermine 37.5 MG capsule Take 1 capsule (37.5 mg total) by mouth every morning.   No facility-administered encounter medications on file as of 01/26/2024.    Past Medical History:  Diagnosis Date   Allergy    Arthritis    Deviated septum    Diabetes mellitus without complication (HCC)    GERD (gastroesophageal reflux disease)    Hypertension     Past Surgical History:  Procedure Laterality Date   ETHMOIDECTOMY Right 05/17/2018   Procedure: RIGHT ETHMOIDECTOMY WITH TISSUE REMOVAL;  Surgeon: Newman Pies, MD;  Location: [facility];  Service: ENT;  Laterality: Right;   FRONTAL SINUS EXPLORATION Right 05/17/2018  Procedure: RIGHT FRONTAL RECESS EXPLORATION;  Surgeon: Newman Pies, MD;  Location: [facility];  Service: ENT;  Laterality: Right;   MAXILLARY ANTROSTOMY Right 05/17/2018   Procedure: RIGHT MAXILLARY ANTROSTOMY WITH TISSUE REMOVAL;  Surgeon: Newman Pies, MD;  Location: [facility];  Service: ENT;  Laterality: Right;   NASAL SEPTOPLASTY W/ TURBINOPLASTY Bilateral 05/17/2018   Procedure: NASAL SEPTOPLASTY WITH TURBINATE REDUCTION;  Surgeon: Newman Pies, MD;  Location: [facility];  Service: ENT;  Laterality:  Bilateral;   SINUS ENDO WITH FUSION N/A 05/17/2018   Procedure: SINUS ENDO WITH FUSION;  Surgeon: Newman Pies, MD;  Location: [facility];  Service: ENT;  Laterality: N/A;   SPHENOIDECTOMY Right 05/17/2018   Procedure: RIGHT SPHENOIDECTOMY WITH TISSUE REMOVAL;  Surgeon: Newman Pies, MD;  Location: [facility];  Service: ENT;  Laterality: Right;   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   Procedure: TOTAL HIP ARTHROPLASTY;  Surgeon: Valeria Batman, MD;  Location: [facility];  Service: Orthopedics;  Laterality: Left;   TUBAL LIGATION      Family History  Problem Relation Age of Onset   Asthma Other    Cancer Other    Hypertension Other    Diabetes Other    Colon cancer Maternal Grandmother    Breast cancer Neg Hx    Esophageal cancer Neg Hx    Rectal cancer Neg Hx    Stomach cancer Neg Hx     Social History   Socioeconomic History   Marital status: Widowed    Spouse name: Not on file   Number of children: Not on file   Years of education: Not on file   Highest education level: Some college, no degree  Occupational History   Not on file  Tobacco Use   Smoking status: Never   Smokeless tobacco: Never  Vaping Use   Vaping status: Never Used  Substance and Sexual Activity   Alcohol use: No   Drug use: Never   Sexual activity: Not on file  Other Topics Concern   Not on file  Social History Narrative   Not on file   Social Drivers of Health   Financial Resource Strain: Low Risk  (01/25/2024)   Overall Financial Resource Strain (CARDIA)    Difficulty of Paying Living Expenses: Not very hard  Food Insecurity: No Food Insecurity (01/25/2024)   Hunger Vital Sign    Worried About Running Out of Food in the Last Year: Never true    Ran Out of Food in the Last Year: Never true  Transportation Needs: No Transportation Needs (01/25/2024)   PRAPARE - Administrator, Civil Service (Medical): No    Lack of Transportation (Non-Medical): No  Physical  Activity: Insufficiently Active (01/25/2024)   Exercise Vital Sign    Days of Exercise per Week: 3 days    Minutes of Exercise per Session: 30 min  Stress: No Stress Concern Present (01/25/2024)   [employer] of Occupational Health - Occupational Stress Questionnaire    Feeling of Stress : Not at all  Social Connections: Moderately Integrated (01/25/2024)   Social Connection and Isolation Panel [NHANES]    Frequency of Communication with Friends and Family: More than three times a week    Frequency of Social Gatherings with Friends and Family: More than three times a week    Attends Religious Services: More than 4 times per year    Active Member of Clubs or Organizations: No  Attends Banker Meetings: More than 4 times per year    Marital Status: Widowed  Intimate Partner Violence: Unknown (02/11/2023)   Humiliation, Afraid, Rape, and Kick questionnaire    Fear of Current or Ex-Partner: No    Emotionally Abused: No    Physically Abused: Not on file    Sexually Abused: No    Review of Systems  All other systems reviewed and are negative.       Objective    BP 120/81   Pulse 67   Temp 98.2 F (36.8 C) (Oral)   Resp 18   Ht 5' 2.5" (1.588 m)   Wt 159 lb 6.4 oz (72.3 kg)   SpO2 97%   BMI 28.69 kg/m   Physical Exam Vitals and nursing note reviewed.  Constitutional:      General: She is not in acute distress. Cardiovascular:     Rate and Rhythm: Normal rate and regular rhythm.  Pulmonary:     Effort: Pulmonary effort is normal.     Breath sounds: Normal breath sounds.  Abdominal:     Palpations: Abdomen is soft.     Tenderness: There is no abdominal tenderness.  Neurological:     General: No focal deficit present.     Mental Status: She is alert and oriented to person, place, and time.         Assessment & Plan:   Encounter for weight management  Overweight (BMI 25.0-29.9)  Mild persistent reactive airway disease without  complication  Other orders -     Phentermine HCl; Take 1 capsule (37.5 mg total) by mouth every morning.  Dispense: 30 capsule; Refill: 0 -     Albuterol Sulfate HFA; Inhale 2 puffs into the lungs every 4 (four) hours.  Dispense: 8.5 g; Refill: 3     Return in about 4 weeks (around 02/23/2024) for follow up.   Tommie Raymond, MD

## 2024-02-23 ENCOUNTER — Ambulatory Visit: Payer: Medicare Other

## 2024-02-23 VITALS — BP 120/81 | Ht 62.5 in | Wt 156.0 lb

## 2024-02-23 DIAGNOSIS — E119 Type 2 diabetes mellitus without complications: Secondary | ICD-10-CM

## 2024-02-23 DIAGNOSIS — Z0000 Encounter for general adult medical examination without abnormal findings: Secondary | ICD-10-CM

## 2024-02-23 DIAGNOSIS — Z2821 Immunization not carried out because of patient refusal: Secondary | ICD-10-CM

## 2024-02-23 NOTE — Patient Instructions (Signed)
 Barbara Adkins , Thank you for taking time to come for your Medicare Wellness Visit. I appreciate your ongoing commitment to your health goals. Please review the following plan we discussed and let me know if I can assist you in the future.   Referrals/Orders/Follow-Ups/Clinician Recommendations: follow up.  This is a list of the screening recommended for you and due dates:  Health Maintenance  Topic Date Due   COVID-19 Vaccine (1 - 2024-25 season) Never done   Yearly kidney function blood test for diabetes  02/11/2024   Yearly kidney health urinalysis for diabetes  02/11/2024   Flu Shot  06/15/2024   Medicare Annual Wellness Visit  02/22/2025   Mammogram  07/28/2025   Colon Cancer Screening  07/03/2028   DTaP/Tdap/Td vaccine (3 - Td or Tdap) 08/30/2028   Pneumonia Vaccine  Completed   DEXA scan (bone density measurement)  Completed   Hepatitis C Screening  Completed   Zoster (Shingles) Vaccine  Completed   HPV Vaccine  Aged Out   Meningitis B Vaccine  Aged Out    Advanced directives: (Declined) Advance directive discussed with you today. Even though you declined this today, please call our office should you change your mind, and we can give you the proper paperwork for you to fill out.  Next Medicare Annual Wellness Visit scheduled for next year: No

## 2024-02-23 NOTE — Progress Notes (Signed)
 Because this visit was a virtual/telehealth visit,  certain criteria was not obtained, such a blood pressure, CBG if applicable, and timed get up and go. Any medications not marked as "taking" were not mentioned during the medication reconciliation part of the visit. Any vitals not documented were not able to be obtained due to this being a telehealth visit or patient was unable to self-report a recent blood pressure reading due to a lack of equipment at home via telehealth. Vitals that have been documented are verbally provided by the patient.   Subjective:   Barbara Adkins is a 73 y.o. who presents for a Medicare Wellness preventive visit.  Visit Complete: Virtual I connected with  Joann U Deveney on 02/23/24 by a audio enabled telemedicine application and verified that I am speaking with the correct person using two identifiers.  Patient Location: Home  Provider Location: Home Office  I discussed the limitations of evaluation and management by telemedicine. The patient expressed understanding and agreed to proceed.  Vital Signs: Because this visit was a virtual/telehealth visit, some criteria may be missing or patient reported. Any vitals not documented were not able to be obtained and vitals that have been documented are patient reported.  VideoDeclined- This patient declined Librarian, academic. Therefore the visit was completed with audio only.  Persons Participating in Visit: Patient.  AWV Questionnaire: No: Patient Medicare AWV questionnaire was not completed prior to this visit.  Cardiac Risk Factors include: advanced age (>43men, >54 women);hypertension;dyslipidemia     Objective:    Today's Vitals   02/23/24 0939 02/23/24 0940  BP: 120/81   Weight: 156 lb (70.8 kg)   Height: 5' 2.5" (1.588 m)   PainSc:  0-No pain   Body mass index is 28.08 kg/m.     02/23/2024    9:38 AM 02/11/2023   10:43 AM 06/13/2020    4:18 PM 09/25/2019    10:06 AM 11/28/2018    3:54 PM 11/28/2018    5:51 AM 11/20/2018    9:14 AM  Advanced Directives  Does Patient Have a Medical Advance Directive? No No No No Yes No No  Type of Advance Directive     Living will    Does patient want to make changes to medical advance directive?     No - Patient declined    Would patient like information on creating a medical advance directive? No - Patient declined No - Patient declined No - Patient declined No - Patient declined No - Patient declined No - Patient declined No - Patient declined    Current Medications (verified) Outpatient Encounter Medications as of 02/23/2024  Medication Sig   acetaminophen (TYLENOL) 325 MG tablet Take 2 tablets (650 mg total) by mouth every 6 (six) hours.   albuterol (VENTOLIN HFA) 108 (90 Base) MCG/ACT inhaler Inhale 2 puffs into the lungs every 4 (four) hours.   amLODipine (NORVASC) 10 MG tablet TAKE 1 TABLET BY MOUTH DAILY   atorvastatin (LIPITOR) 10 MG tablet Atorvastatin Calcium   atorvastatin (LIPITOR) 20 MG tablet TAKE 1 TABLET BY MOUTH ONCE  DAILY   Blood Pressure Monitoring (BLOOD PRESSURE KIT) DEVI 1 Device by Does not apply route 3 (three) times a week. Use to check blood pressure three times a week. ICD10 I10   budesonide-formoterol (SYMBICORT) 80-4.5 MCG/ACT inhaler Symbicort   cetirizine (ZYRTEC) 10 MG tablet Take 1 tablet (10 mg total) by mouth daily.   fluticasone (FLONASE) 50 MCG/ACT nasal spray Place 2 sprays into  both nostrils daily.   hydrochlorothiazide (HYDRODIURIL) 25 MG tablet TAKE 1 TABLET BY MOUTH DAILY   ketorolac (ACULAR) 0.5 % ophthalmic solution Place 1 drop into the left eye 4 (four) times daily.   latanoprost (XALATAN) 0.005 % ophthalmic solution Place 1 drop into both eyes nightly.   latanoprost (XALATAN) 0.005 % ophthalmic solution    loratadine (CLARITIN) 5 MG chewable tablet Claritin   losartan (COZAAR) 100 MG tablet TAKE 1 TABLET BY MOUTH EVERY DAY   meloxicam (MOBIC) 7.5 MG tablet TAKE 1  TABLET BY MOUTH DAILY   metFORMIN (GLUCOPHAGE) 500 MG tablet TAKE 1 TABLET BY MOUTH DAILY  WITH BREAKFAST   montelukast (SINGULAIR) 10 MG tablet TAKE 1 TABLET BY MOUTH AT  BEDTIME   omeprazole (PRILOSEC) 20 MG capsule Take 1 capsule (20 mg total) by mouth daily.   phentermine 37.5 MG capsule Take 1 capsule (37.5 mg total) by mouth every morning.   prednisoLONE acetate (PRED FORTE) 1 % ophthalmic suspension Place 1 drop into the left eye 4 (four) times daily. Start using day after surgery as instructed   SYMBICORT 80-4.5 MCG/ACT inhaler INHALE 2 INHALATIONS BY MOUTH IN THE MORNING AND AT BEDTIME   Timolol-Dorzolamid-Latanoprost 0.5-0.15-0.005 % SOLN Apply to eye.   traMADol (ULTRAM) 50 MG tablet Take 1 tablet (50 mg total) by mouth 2 (two) times daily as needed for severe pain (pain score 7-10).   No facility-administered encounter medications on file as of 02/23/2024.    Allergies (verified) Patient has no known allergies.   History: Past Medical History:  Diagnosis Date   Allergy    Arthritis    Deviated septum    Diabetes mellitus without complication (HCC)    GERD (gastroesophageal reflux disease)    Hypertension    Past Surgical History:  Procedure Laterality Date   ETHMOIDECTOMY Right 05/17/2018   Procedure: RIGHT ETHMOIDECTOMY WITH TISSUE REMOVAL;  Surgeon: Newman Pies, MD;  Location: [facility];  Service: ENT;  Laterality: Right;   FRONTAL SINUS EXPLORATION Right 05/17/2018   Procedure: RIGHT FRONTAL RECESS EXPLORATION;  Surgeon: Newman Pies, MD;  Location: [facility];  Service: ENT;  Laterality: Right;   MAXILLARY ANTROSTOMY Right 05/17/2018   Procedure: RIGHT MAXILLARY ANTROSTOMY WITH TISSUE REMOVAL;  Surgeon: Newman Pies, MD;  Location: [facility];  Service: ENT;  Laterality: Right;   NASAL SEPTOPLASTY W/ TURBINOPLASTY Bilateral 05/17/2018   Procedure: NASAL SEPTOPLASTY WITH TURBINATE REDUCTION;  Surgeon: Newman Pies, MD;  Location: [facility];  Service: ENT;  Laterality: Bilateral;   SINUS ENDO WITH FUSION N/A 05/17/2018   Procedure: SINUS ENDO WITH FUSION;  Surgeon: Newman Pies, MD;  Location: [facility];  Service: ENT;  Laterality: N/A;   SPHENOIDECTOMY Right 05/17/2018   Procedure: RIGHT SPHENOIDECTOMY WITH TISSUE REMOVAL;  Surgeon: Newman Pies, MD;  Location: [facility];  Service: ENT;  Laterality: Right;   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   Procedure: TOTAL HIP ARTHROPLASTY;  Surgeon: Valeria Batman, MD;  Location: [facility];  Service: Orthopedics;  Laterality: Left;   TUBAL LIGATION     Family History  Problem Relation Age of Onset   Asthma Other    Cancer Other    Hypertension Other    Diabetes Other    Colon cancer Maternal Grandmother    Breast cancer Neg Hx    Esophageal cancer Neg Hx    Rectal cancer Neg Hx    Stomach cancer Neg Hx  Social History   Socioeconomic History   Marital status: Widowed    Spouse name: Not on file   Number of children: Not on file   Years of education: Not on file   Highest education level: Some college, no degree  Occupational History   Not on file  Tobacco Use   Smoking status: Never   Smokeless tobacco: Never  Vaping Use   Vaping status: Never Used  Substance and Sexual Activity   Alcohol use: No   Drug use: Never   Sexual activity: Not on file  Other Topics Concern   Not on file  Social History Narrative   Not on file   Social Drivers of Health   Financial Resource Strain: Low Risk  (02/23/2024)   Overall Financial Resource Strain (CARDIA)    Difficulty of Paying Living Expenses: Not very hard  Food Insecurity: No Food Insecurity (02/23/2024)   Hunger Vital Sign    Worried About Running Out of Food in the Last Year: Never true    Ran Out of Food in the Last Year: Never true  Transportation Needs: No Transportation Needs (02/23/2024)   PRAPARE - Administrator, Civil Service (Medical): No    Lack of  Transportation (Non-Medical): No  Physical Activity: Insufficiently Active (02/23/2024)   Exercise Vital Sign    Days of Exercise per Week: 3 days    Minutes of Exercise per Session: 30 min  Stress: No Stress Concern Present (02/23/2024)   [employer] of Occupational Health - Occupational Stress Questionnaire    Feeling of Stress : Not at all  Social Connections: Moderately Integrated (02/23/2024)   Social Connection and Isolation Panel [NHANES]    Frequency of Communication with Friends and Family: More than three times a week    Frequency of Social Gatherings with Friends and Family: More than three times a week    Attends Religious Services: More than 4 times per year    Active Member of [employer] or Organizations: No    Attends Engineer, structural: More than 4 times per year    Marital Status: Widowed  Recent Concern: Social Connections - Moderately Isolated (01/25/2024)   Social Connection and Isolation Panel [NHANES]    Frequency of Communication with Friends and Family: More than three times a week    Frequency of Social Gatherings with Friends and Family: More than three times a week    Attends Religious Services: More than 4 times per year    Active Member of [employer] or Organizations: No    Attends Banker Meetings: Not on file    Marital Status: Widowed    Tobacco Counseling Counseling given: Not Answered    Clinical Intake:  Pre-visit preparation completed: Yes  Pain : No/denies pain Pain Score: 0-No pain     BMI - recorded: 28.08 Nutritional Status: BMI 25 -29 Overweight Nutritional Risks: None Diabetes: No  Lab Results  Component Value Date   HGBA1C 6.1 (H) 02/11/2023   HGBA1C 5.9 (A) 09/27/2022   HGBA1C 5.9 (H) 02/01/2022     What is the last grade level you completed in school?: 1 year college  Interpreter Needed?: No  Information entered by :: Estell Harpin   Activities of Daily Living     02/23/2024    9:47 AM   In your present state of health, do you have any difficulty performing the following activities:  Hearing? 0  Vision? 0  Difficulty concentrating or making decisions? 0  Walking or climbing stairs? 0  Dressing or bathing? 0  Doing errands, shopping? 0  Preparing Food and eating ? N  Using the Toilet? N  In the past six months, have you accidently leaked urine? N  Do you have problems with loss of bowel control? N  Managing your Medications? N  Managing your Finances? N  Housekeeping or managing your Housekeeping? N    Patient Care Team: Georganna Skeans, MD as PCP - General (Family Medicine) Valeria Batman, MD (Inactive) as Consulting Physician (Orthopedic Surgery)  Indicate any recent Medical Services you may have received from other than [facility] providers in the past year (date may be approximate).     Assessment:   This is a routine wellness examination for Ariana.  Hearing/Vision screen Hearing Screening - Comments:: No hearing issues Vision Screening - Comments:: Wears glasses   Goals Addressed             This Visit's Progress    Patient Stated       She would like see more family more       Depression Screen     02/23/2024    9:49 AM 01/26/2024    2:34 PM 12/27/2023    2:57 PM 11/01/2023    3:38 PM 09/13/2023    1:29 PM 04/28/2023    2:56 PM 02/11/2023   10:44 AM  PHQ 2/9 Scores  PHQ - 2 Score 0 0 0 0 0 0 0  PHQ- 9 Score 0     0     Fall Risk     02/23/2024    9:46 AM 12/27/2023    2:57 PM 09/13/2023    1:29 PM 02/11/2023   10:43 AM 02/07/2023   10:05 AM  Fall Risk   Falls in the past year? 0 0 0 0 0  Number falls in past yr: 0 0 0 0 0  Injury with Fall? 0 0 0 0 0  Risk for fall due to : No Fall Risks No Fall Risks No Fall Risks No Fall Risks   Follow up Falls evaluation completed Falls evaluation completed Falls evaluation completed      MEDICARE RISK AT HOME:  Medicare Risk at Home Any stairs in or around the home?: Yes If so, are there  any without handrails?: No Home free of loose throw rugs in walkways, pet beds, electrical cords, etc?: Yes Adequate lighting in your home to reduce risk of falls?: Yes Life alert?: No Use of a cane, walker or w/c?: No Grab bars in the bathroom?: Yes Shower chair or bench in shower?: Yes Elevated toilet seat or a handicapped toilet?: No  TIMED UP AND GO:  Was the test performed?  No  Cognitive Function: 6CIT completed    02/11/2023   10:45 AM  MMSE - Mini Mental State Exam  Orientation to time 5  Orientation to Place 5  Registration 3  Attention/ Calculation 5  Recall 3  Language- name 2 objects 2  Language- repeat 1  Language- follow 3 step command 3  Language- read & follow direction 1  Write a sentence 1        02/23/2024    9:43 AM 02/11/2023   10:45 AM 09/25/2019   10:02 AM  6CIT Screen  What Year? 0 points 4 points 0 points  What month? 0 points 3 points 0 points  What time? 0 points 3 points 0 points  Count back from 20 0 points 4 points 0 points  Months in reverse 0 points 4 points 0 points  Repeat phrase 0 points 10 points 0 points  Total Score 0 points 28 points 0 points    Immunizations Immunization History  Administered Date(s) Administered   Fluad Quad(high Dose 65+) 08/21/2019, 07/24/2020, 08/06/2021, 08/11/2022   Influenza, High Dose Seasonal PF 08/12/2018   Influenza,inj,Quad PF,6+ Mos 11/01/2017   Influenza-Unspecified 08/15/2023   Pneumococcal Conjugate-13 09/25/2019   Pneumococcal Polysaccharide-23 08/28/2018   Tdap 08/30/2018, 08/30/2018   Zoster Recombinant(Shingrix) 12/07/2019, 03/05/2020    Screening Tests Health Maintenance  Topic Date Due   COVID-19 Vaccine (1 - 2024-25 season) Never done   Diabetic kidney evaluation - eGFR measurement  02/11/2024   Diabetic kidney evaluation - Urine ACR  02/11/2024   INFLUENZA VACCINE  06/15/2024   Medicare Annual Wellness (AWV)  02/22/2025   MAMMOGRAM  07/28/2025   Colonoscopy  07/03/2028    DTaP/Tdap/Td (3 - Td or Tdap) 08/30/2028   Pneumonia Vaccine 77+ Years old  Completed   DEXA SCAN  Completed   Hepatitis C Screening  Completed   Zoster Vaccines- Shingrix  Completed   HPV VACCINES  Aged Out   Meningococcal B Vaccine  Aged Out    Health Maintenance  Health Maintenance Due  Topic Date Due   COVID-19 Vaccine (1 - 2024-25 season) Never done   Diabetic kidney evaluation - eGFR measurement  02/11/2024   Diabetic kidney evaluation - Urine ACR  02/11/2024   Health Maintenance Items Addressed: UACR (Urine Albumin:Creatinine Ratio), declined vaccine  Additional Screening:  Vision Screening: Recommended annual ophthalmology exams for early detection of glaucoma and other disorders of the eye.  Dental Screening: Recommended annual dental exams for proper oral hygiene  Community Resource Referral / Chronic Care Management: CRR required this visit?  No   CCM required this visit?  No     Plan:     I have personally reviewed and noted the following in the patient's chart:   Medical and social history Use of alcohol, tobacco or illicit drugs  Current medications and supplements including opioid prescriptions. Patient is not currently taking opioid prescriptions. Functional ability and status Nutritional status Physical activity Advanced directives List of other physicians Hospitalizations, surgeries, and ER visits in previous 12 months Vitals Screenings to include cognitive, depression, and falls Referrals and appointments  In addition, I have reviewed and discussed with patient certain preventive protocols, quality metrics, and best practice recommendations. A written personalized care plan for preventive services as well as general preventive health recommendations were provided to patient.     Rudi Heap, [place]   02/23/2024   After Visit Summary: (MyChart) Due to this being a telephonic visit, the after visit summary with patients personalized plan  was offered to patient via MyChart   Notes: Nothing significant to report at this time.

## 2024-03-07 ENCOUNTER — Ambulatory Visit: Admitting: Family Medicine

## 2024-03-07 ENCOUNTER — Encounter: Payer: Self-pay | Admitting: Family Medicine

## 2024-03-07 VITALS — BP 128/83 | HR 57 | Wt 158.0 lb

## 2024-03-07 DIAGNOSIS — Z7689 Persons encountering health services in other specified circumstances: Secondary | ICD-10-CM

## 2024-03-07 DIAGNOSIS — E663 Overweight: Secondary | ICD-10-CM | POA: Diagnosis not present

## 2024-03-07 DIAGNOSIS — Z6828 Body mass index (BMI) 28.0-28.9, adult: Secondary | ICD-10-CM

## 2024-03-07 NOTE — Progress Notes (Unsigned)
 Established Patient Office Visit  Subjective    Patient ID: Barbara Adkins, female    DOB: [birth year]  Age: 73 y.o. MRN: [id number]  CC:  Chief Complaint  Patient presents with   Medical Management of Chronic Issues    HPI Barbara Adkins presents for routine weight management.   Outpatient Encounter Medications as of 03/07/2024  Medication Sig   acetaminophen  (TYLENOL ) 325 MG tablet Take 2 tablets (650 mg total) by mouth every 6 (six) hours.   albuterol  (VENTOLIN  HFA) 108 (90 Base) MCG/ACT inhaler Inhale 2 puffs into the lungs every 4 (four) hours.   amLODipine  (NORVASC ) 10 MG tablet TAKE 1 TABLET BY MOUTH DAILY   atorvastatin  (LIPITOR) 10 MG tablet Atorvastatin  Calcium    atorvastatin  (LIPITOR) 20 MG tablet TAKE 1 TABLET BY MOUTH ONCE  DAILY   Blood Pressure Monitoring (BLOOD PRESSURE KIT) DEVI 1 Device by Does not apply route 3 (three) times a week. Use to check blood pressure three times a week. ICD10 I10   budesonide -formoterol  (SYMBICORT ) 80-4.5 MCG/ACT inhaler Symbicort    cetirizine  (ZYRTEC ) 10 MG tablet Take 1 tablet (10 mg total) by mouth daily.   fluticasone  (FLONASE ) 50 MCG/ACT nasal spray Place 2 sprays into both nostrils daily.   hydrochlorothiazide  (HYDRODIURIL ) 25 MG tablet TAKE 1 TABLET BY MOUTH DAILY   ketorolac  (ACULAR ) 0.5 % ophthalmic solution Place 1 drop into the left eye 4 (four) times daily.   latanoprost  (XALATAN ) 0.005 % ophthalmic solution Place 1 drop into both eyes nightly.   latanoprost  (XALATAN ) 0.005 % ophthalmic solution    loratadine  (CLARITIN ) 5 MG chewable tablet Claritin    losartan  (COZAAR ) 100 MG tablet TAKE 1 TABLET BY MOUTH EVERY DAY   meloxicam  (MOBIC ) 7.5 MG tablet TAKE 1 TABLET BY MOUTH DAILY   metFORMIN  (GLUCOPHAGE ) 500 MG tablet TAKE 1 TABLET BY MOUTH DAILY  WITH BREAKFAST   montelukast  (SINGULAIR ) 10 MG tablet TAKE 1 TABLET BY MOUTH AT  BEDTIME   omeprazole  (PRILOSEC ) 20 MG capsule Take 1 capsule (20 mg total) by mouth daily.    prednisoLONE  acetate (PRED FORTE ) 1 % ophthalmic suspension Place 1 drop into the left eye 4 (four) times daily. Start using day after surgery as instructed   SYMBICORT  80-4.5 MCG/ACT inhaler INHALE 2 INHALATIONS BY MOUTH IN THE MORNING AND AT BEDTIME   Timolol-Dorzolamid-Latanoprost  0.5-0.15-0.005 % SOLN Apply to eye.   traMADol  (ULTRAM ) 50 MG tablet Take 1 tablet (50 mg total) by mouth 2 (two) times daily as needed for severe pain (pain score 7-10).   [DISCONTINUED] phentermine  37.5 MG capsule Take 1 capsule (37.5 mg total) by mouth every morning.   phentermine  37.5 MG capsule Take 1 capsule (37.5 mg total) by mouth every morning.   No facility-administered encounter medications on file as of 03/07/2024.    Past Medical History:  Diagnosis Date   Allergy    Arthritis    Deviated septum    Diabetes mellitus without complication (HCC)    GERD (gastroesophageal reflux disease)    Hypertension     Past Surgical History:  Procedure Laterality Date   ETHMOIDECTOMY Right 05/17/2018   Procedure: RIGHT ETHMOIDECTOMY WITH TISSUE REMOVAL;  Surgeon: Reynold Caves, MD;  Location: [facility];  Service: ENT;  Laterality: Right;   FRONTAL SINUS EXPLORATION Right 05/17/2018   Procedure: RIGHT FRONTAL RECESS EXPLORATION;  Surgeon: Reynold Caves, MD;  [facility];  Service: ENT;  Laterality: Right;   MAXILLARY ANTROSTOMY Right 05/17/2018   Procedure: RIGHT  MAXILLARY ANTROSTOMY WITH TISSUE REMOVAL;  Surgeon: Reynold Caves, MD;  Location: [facility];  Service: ENT;  Laterality: Right;   NASAL SEPTOPLASTY W/ TURBINOPLASTY Bilateral 05/17/2018   Procedure: NASAL SEPTOPLASTY WITH TURBINATE REDUCTION;  Surgeon: Reynold Caves, MD;  Location: [facility];  Service: ENT;  Laterality: Bilateral;   SINUS ENDO WITH FUSION N/A 05/17/2018   Procedure: SINUS ENDO WITH FUSION;  Surgeon: Reynold Caves, MD;  Location: [facility];  Service: ENT;  Laterality: N/A;    SPHENOIDECTOMY Right 05/17/2018   Procedure: RIGHT SPHENOIDECTOMY WITH TISSUE REMOVAL;  Surgeon: Reynold Caves, MD;  Location: [facility];  Service: ENT;  Laterality: Right;   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   Procedure: TOTAL HIP ARTHROPLASTY;  Surgeon: Shirlee Dotter, MD;  Location: [facility];  Service: Orthopedics;  Laterality: Left;   TUBAL LIGATION      Family History  Problem Relation Age of Onset   Asthma Other    Cancer Other    Hypertension Other    Diabetes Other    Colon cancer Maternal Grandmother    Breast cancer Neg Hx    Esophageal cancer Neg Hx    Rectal cancer Neg Hx    Stomach cancer Neg Hx     Social History   Socioeconomic History   Marital status: Widowed    Spouse name: Not on file   Number of children: Not on file   Years of education: Not on file   Highest education level: Some college, no degree  Occupational History   Not on file  Tobacco Use   Smoking status: Never   Smokeless tobacco: Never  Vaping Use   Vaping status: Never Used  Substance and Sexual Activity   Alcohol use: No   Drug use: Never   Sexual activity: Not on file  Other Topics Concern   Not on file  Social History Narrative   Not on file   Social Drivers of Health   Financial Resource Strain: Low Risk  (02/23/2024)   Overall Financial Resource Strain (CARDIA)    Difficulty of Paying Living Expenses: Not very hard  Food Insecurity: No Food Insecurity (02/23/2024)   Hunger Vital Sign    Worried About Running Out of Food in the Last Year: Never true    Ran Out of Food in the Last Year: Never true  Transportation Needs: No Transportation Needs (02/23/2024)   PRAPARE - Administrator, Civil Service (Medical): No    Lack of Transportation (Non-Medical): No  Physical Activity: Insufficiently Active (02/23/2024)   Exercise Vital Sign    Days of Exercise per Week: 3 days    Minutes of Exercise per Session: 30 min  Stress: No Stress Concern Present  (02/23/2024)   [employer] of Occupational Health - Occupational Stress Questionnaire    Feeling of Stress : Not at all  Social Connections: Moderately Integrated (02/23/2024)   Social Connection and Isolation Panel [NHANES]    Frequency of Communication with Friends and Family: More than three times a week    Frequency of Social Gatherings with Friends and Family: More than three times a week    Attends Religious Services: More than 4 times per year    Active Member of [employer] or Organizations: No    Attends Engineer, structural: More than 4 times per year    Marital Status: Widowed  Recent Concern: Social Connections - Moderately Isolated (01/25/2024)   Social Connection and  Isolation Panel [NHANES]    Frequency of Communication with Friends and Family: More than three times a week    Frequency of Social Gatherings with Friends and Family: More than three times a week    Attends Religious Services: More than 4 times per year    Active Member of [employer] or Organizations: No    Attends Banker Meetings: Not on file    Marital Status: Widowed  Intimate Partner Violence: Unknown (02/23/2024)   Humiliation, Afraid, Rape, and Kick questionnaire    Fear of Current or Ex-Partner: No    Emotionally Abused: No    Physically Abused: Not on file    Sexually Abused: No    Review of Systems  All other systems reviewed and are negative.       Objective    BP 128/83 (BP Location: Right Arm, Patient Position: Sitting, Cuff Size: Normal)   Pulse (!) 57   Wt 158 lb (71.7 kg)   SpO2 96%   BMI 28.44 kg/m   Physical Exam Vitals and nursing note reviewed.  Constitutional:      General: She is not in acute distress. Cardiovascular:     Rate and Rhythm: Normal rate and regular rhythm.  Pulmonary:     Effort: Pulmonary effort is normal.     Breath sounds: Normal breath sounds.  Abdominal:     Palpations: Abdomen is soft.     Tenderness: There is no abdominal  tenderness.  Neurological:     General: No focal deficit present.     Mental Status: She is alert and oriented to person, place, and time.         Assessment & Plan:   Encounter for weight management  Overweight (BMI 25.0-29.9)  Other orders -     Phentermine  HCl; Take 1 capsule (37.5 mg total) by mouth every morning.  Dispense: 30 capsule; Refill: 0   In maintenance phase of weight loss  No follow-ups on file.   Arlo Lama, MD

## 2024-03-09 ENCOUNTER — Encounter: Payer: Self-pay | Admitting: Family Medicine

## 2024-03-09 ENCOUNTER — Other Ambulatory Visit (HOSPITAL_COMMUNITY): Payer: Self-pay

## 2024-03-09 MED ORDER — PHENTERMINE HCL 37.5 MG PO CAPS
37.5000 mg | ORAL_CAPSULE | ORAL | 0 refills | Status: DC
  Filled 2024-03-09: qty 30, 30d supply, fill #0

## 2024-03-12 ENCOUNTER — Other Ambulatory Visit (HOSPITAL_COMMUNITY): Payer: Self-pay

## 2024-04-03 ENCOUNTER — Telehealth: Payer: Self-pay | Admitting: Family

## 2024-04-04 ENCOUNTER — Ambulatory Visit
Admission: RE | Admit: 2024-04-04 | Discharge: 2024-04-04 | Disposition: A | Discharge status: Home / Self Care | Payer: Medicare Other | Source: Ambulatory Visit | Attending: Nurse Practitioner | Admitting: Nurse Practitioner

## 2024-04-04 DIAGNOSIS — Z780 Asymptomatic menopausal state: Secondary | ICD-10-CM

## 2024-04-05 NOTE — Telephone Encounter (Signed)
 FYI- Patient has appointment coming up on 5/28

## 2024-04-11 ENCOUNTER — Ambulatory Visit (INDEPENDENT_AMBULATORY_CARE_PROVIDER_SITE_OTHER): Admitting: Family Medicine

## 2024-04-11 ENCOUNTER — Other Ambulatory Visit (HOSPITAL_COMMUNITY): Payer: Self-pay

## 2024-04-11 VITALS — BP 111/75 | HR 72

## 2024-04-11 DIAGNOSIS — Z96642 Presence of left artificial hip joint: Secondary | ICD-10-CM | POA: Diagnosis not present

## 2024-04-11 DIAGNOSIS — Z7689 Persons encountering health services in other specified circumstances: Secondary | ICD-10-CM

## 2024-04-11 DIAGNOSIS — E663 Overweight: Secondary | ICD-10-CM | POA: Diagnosis not present

## 2024-04-11 MED ORDER — TRAMADOL HCL 50 MG PO TABS
50.0000 mg | ORAL_TABLET | Freq: Two times a day (BID) | ORAL | 0 refills | Status: AC | PRN
  Filled 2024-04-11: qty 10, 5d supply, fill #0
  Filled 2024-04-12: qty 30, 15d supply, fill #0

## 2024-04-11 NOTE — Progress Notes (Unsigned)
 Established Patient Office Visit  Subjective    Patient ID: Barbara Adkins, female    DOB: [birth year]  Age: 73 y.o. MRN: [id number]  CC:  Chief Complaint  Patient presents with   Medication Refill   Medical Management of Chronic Issues    HPI Barbara Adkins presents for routine weight management. Patient also reports persistent hip intermittent pain.   Outpatient Encounter Medications as of 04/11/2024  Medication Sig   acetaminophen  (TYLENOL ) 325 MG tablet Take 2 tablets (650 mg total) by mouth every 6 (six) hours.   albuterol  (VENTOLIN  HFA) 108 (90 Base) MCG/ACT inhaler Inhale 2 puffs into the lungs every 4 (four) hours.   amLODipine  (NORVASC ) 10 MG tablet TAKE 1 TABLET BY MOUTH DAILY   atorvastatin  (LIPITOR) 10 MG tablet Atorvastatin  Calcium    atorvastatin  (LIPITOR) 20 MG tablet TAKE 1 TABLET BY MOUTH ONCE  DAILY   Blood Pressure Monitoring (BLOOD PRESSURE KIT) DEVI 1 Device by Does not apply route 3 (three) times a week. Use to check blood pressure three times a week. ICD10 I10   budesonide -formoterol  (SYMBICORT ) 80-4.5 MCG/ACT inhaler Symbicort    cetirizine  (ZYRTEC ) 10 MG tablet Take 1 tablet (10 mg total) by mouth daily.   fluticasone  (FLONASE ) 50 MCG/ACT nasal spray Place 2 sprays into both nostrils daily.   hydrochlorothiazide  (HYDRODIURIL ) 25 MG tablet TAKE 1 TABLET BY MOUTH DAILY   ketorolac  (ACULAR ) 0.5 % ophthalmic solution Place 1 drop into the left eye 4 (four) times daily.   latanoprost  (XALATAN ) 0.005 % ophthalmic solution Place 1 drop into both eyes nightly.   latanoprost  (XALATAN ) 0.005 % ophthalmic solution    loratadine  (CLARITIN ) 5 MG chewable tablet Claritin    losartan  (COZAAR ) 100 MG tablet TAKE 1 TABLET BY MOUTH EVERY DAY   meloxicam  (MOBIC ) 7.5 MG tablet TAKE 1 TABLET BY MOUTH DAILY   metFORMIN  (GLUCOPHAGE ) 500 MG tablet TAKE 1 TABLET BY MOUTH DAILY  WITH BREAKFAST   montelukast  (SINGULAIR ) 10 MG tablet TAKE 1 TABLET BY MOUTH AT  BEDTIME    omeprazole  (PRILOSEC ) 20 MG capsule Take 1 capsule (20 mg total) by mouth daily.   phentermine  37.5 MG capsule Take 1 capsule (37.5 mg total) by mouth every morning.   prednisoLONE  acetate (PRED FORTE ) 1 % ophthalmic suspension Place 1 drop into the left eye 4 (four) times daily. Start using day after surgery as instructed   SYMBICORT  80-4.5 MCG/ACT inhaler INHALE 2 INHALATIONS BY MOUTH IN THE MORNING AND AT BEDTIME   Timolol-Dorzolamid-Latanoprost  0.5-0.15-0.005 % SOLN Apply to eye.   [DISCONTINUED] traMADol  (ULTRAM ) 50 MG tablet Take 1 tablet (50 mg total) by mouth 2 (two) times daily as needed for severe pain (pain score 7-10).   traMADol  (ULTRAM ) 50 MG tablet Take 1 tablet (50 mg total) by mouth 2 (two) times daily as needed for severe pain (pain score 7-10).   No facility-administered encounter medications on file as of 04/11/2024.    Past Medical History:  Diagnosis Date   Allergy    Arthritis    Deviated septum    Diabetes mellitus without complication (HCC)    GERD (gastroesophageal reflux disease)    Hypertension     Past Surgical History:  Procedure Laterality Date   ETHMOIDECTOMY Right 05/17/2018   Procedure: RIGHT ETHMOIDECTOMY WITH TISSUE REMOVAL;  Surgeon: Reynold Caves, MD;  Location: [facility];  Service: ENT;  Laterality: Right;   FRONTAL SINUS EXPLORATION Right 05/17/2018   Procedure: RIGHT FRONTAL RECESS EXPLORATION;  Surgeon: Reynold Caves, MD;  Location: [facility];  Service: ENT;  Laterality: Right;   MAXILLARY ANTROSTOMY Right 05/17/2018   Procedure: RIGHT MAXILLARY ANTROSTOMY WITH TISSUE REMOVAL;  Surgeon: Reynold Caves, MD;  Location: [facility];  Service: ENT;  Laterality: Right;   NASAL SEPTOPLASTY W/ TURBINOPLASTY Bilateral 05/17/2018   Procedure: NASAL SEPTOPLASTY WITH TURBINATE REDUCTION;  Surgeon: Reynold Caves, MD;  Location: [facility];  Service: ENT;  Laterality: Bilateral;   SINUS ENDO WITH FUSION N/A 05/17/2018    Procedure: SINUS ENDO WITH FUSION;  Surgeon: Reynold Caves, MD;  Location: [facility];  Service: ENT;  Laterality: N/A;   SPHENOIDECTOMY Right 05/17/2018   Procedure: RIGHT SPHENOIDECTOMY WITH TISSUE REMOVAL;  Surgeon: Reynold Caves, MD;  Location: [facility];  Service: ENT;  Laterality: Right;   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   Procedure: TOTAL HIP ARTHROPLASTY;  Surgeon: Shirlee Dotter, MD;  Location: [facility];  Service: Orthopedics;  Laterality: Left;   TUBAL LIGATION      Family History  Problem Relation Age of Onset   Asthma Other    Cancer Other    Hypertension Other    Diabetes Other    Colon cancer Maternal Grandmother    Breast cancer Neg Hx    Esophageal cancer Neg Hx    Rectal cancer Neg Hx    Stomach cancer Neg Hx     Social History   Socioeconomic History   Marital status: Widowed    Spouse name: Not on file   Number of children: Not on file   Years of education: Not on file   Highest education level: Some college, no degree  Occupational History   Not on file  Tobacco Use   Smoking status: Never   Smokeless tobacco: Never  Vaping Use   Vaping status: Never Used  Substance and Sexual Activity   Alcohol use: No   Drug use: Never   Sexual activity: Not on file  Other Topics Concern   Not on file  Social History Narrative   Not on file   Social Drivers of Health   Financial Resource Strain: Low Risk  (02/23/2024)   Overall Financial Resource Strain (CARDIA)    Difficulty of Paying Living Expenses: Not very hard  Food Insecurity: No Food Insecurity (02/23/2024)   Hunger Vital Sign    Worried About Running Out of Food in the Last Year: Never true    Ran Out of Food in the Last Year: Never true  Transportation Needs: No Transportation Needs (02/23/2024)   PRAPARE - Administrator, Civil Service (Medical): No    Lack of Transportation (Non-Medical): No  Physical Activity: Insufficiently Active (02/23/2024)   Exercise  Vital Sign    Days of Exercise per Week: 3 days    Minutes of Exercise per Session: 30 min  Stress: No Stress Concern Present (02/23/2024)   [employer] of Occupational Health - Occupational Stress Questionnaire    Feeling of Stress : Not at all  Social Connections: Moderately Integrated (02/23/2024)   Social Connection and Isolation Panel [NHANES]    Frequency of Communication with Friends and Family: More than three times a week    Frequency of Social Gatherings with Friends and Family: More than three times a week    Attends Religious Services: More than 4 times per year    Active Member of Clubs or Organizations: No    Attends Engineer, structural: More than 4 times per  year    Marital Status: Widowed  Recent Concern: Social Connections - Moderately Isolated (01/25/2024)   Social Connection and Isolation Panel [NHANES]    Frequency of Communication with Friends and Family: More than three times a week    Frequency of Social Gatherings with Friends and Family: More than three times a week    Attends Religious Services: More than 4 times per year    Active Member of [employer] or Organizations: No    Attends Banker Meetings: Not on file    Marital Status: Widowed  Intimate Partner Violence: Unknown (02/23/2024)   Humiliation, Afraid, Rape, and Kick questionnaire    Fear of Current or Ex-Partner: No    Emotionally Abused: No    Physically Abused: Not on file    Sexually Abused: No    Review of Systems  All other systems reviewed and are negative.       Objective    BP 111/75 (BP Location: Right Arm, Patient Position: Sitting, Cuff Size: Normal)   Pulse 72   SpO2 97%   Physical Exam Vitals and nursing note reviewed.  Constitutional:      General: She is not in acute distress. Cardiovascular:     Rate and Rhythm: Normal rate and regular rhythm.  Pulmonary:     Effort: Pulmonary effort is normal.     Breath sounds: Normal breath sounds.   Abdominal:     Palpations: Abdomen is soft.     Tenderness: There is no abdominal tenderness.  Neurological:     General: No focal deficit present.     Mental Status: She is alert and oriented to person, place, and time.         Assessment & Plan:   Encounter for weight management  Overweight (BMI 25.0-29.9)  Status post left hip replacement  Other orders -     traMADol  HCl; Take 1 tablet (50 mg total) by mouth 2 (two) times daily as needed for severe pain (pain score 7-10).  Dispense: 30 tablet; Refill: 0   Will take a hiatus on weight loss meds.   No follow-ups on file.   Arlo Lama, MD

## 2024-04-12 ENCOUNTER — Encounter: Payer: Self-pay | Admitting: Family Medicine

## 2024-04-12 ENCOUNTER — Other Ambulatory Visit (HOSPITAL_COMMUNITY): Payer: Self-pay

## 2024-05-14 ENCOUNTER — Ambulatory Visit (INDEPENDENT_AMBULATORY_CARE_PROVIDER_SITE_OTHER): Admitting: Family Medicine

## 2024-05-14 VITALS — BP 96/64 | HR 65 | Temp 98.1°F | Resp 16 | Wt 159.4 lb

## 2024-05-14 DIAGNOSIS — E119 Type 2 diabetes mellitus without complications: Secondary | ICD-10-CM | POA: Diagnosis not present

## 2024-05-14 DIAGNOSIS — M25511 Pain in right shoulder: Secondary | ICD-10-CM | POA: Diagnosis not present

## 2024-05-14 MED ORDER — PREDNISONE 10 MG (21) PO TBPK: ORAL_TABLET | ORAL | 0 refills | Status: AC

## 2024-05-14 NOTE — Progress Notes (Unsigned)
 Patient said she has been having muscle spasm on right side for a few days 5/10. Patient said that there is nothing triggering pain that she is aware

## 2024-05-15 ENCOUNTER — Encounter: Payer: Self-pay | Admitting: Family Medicine

## 2024-05-15 LAB — POCT GLYCOSYLATED HEMOGLOBIN (HGB A1C): Hemoglobin A1C: 5.8 % — AB (ref 4.0–5.6)

## 2024-05-15 NOTE — Progress Notes (Signed)
 Established Patient Office Visit  Subjective    Patient ID: Barbara Adkins, female    DOB: [birth year]  Age: 73 y.o. MRN: [id number]  CC:  Chief Complaint  Patient presents with   Medical Management of Chronic Issues    HPI Barbara Adkins presents for follow up of diabetes. She also reports that she has been having right shoulder pain. She denies known trauma or injury.   Outpatient Encounter Medications as of 05/14/2024  Medication Sig   acetaminophen  (TYLENOL ) 325 MG tablet Take 2 tablets (650 mg total) by mouth every 6 (six) hours.   albuterol  (VENTOLIN  HFA) 108 (90 Base) MCG/ACT inhaler Inhale 2 puffs into the lungs every 4 (four) hours.   amLODipine  (NORVASC ) 10 MG tablet TAKE 1 TABLET BY MOUTH DAILY   atorvastatin  (LIPITOR) 10 MG tablet Atorvastatin  Calcium    atorvastatin  (LIPITOR) 20 MG tablet TAKE 1 TABLET BY MOUTH ONCE  DAILY   Blood Pressure Monitoring (BLOOD PRESSURE KIT) DEVI 1 Device by Does not apply route 3 (three) times a week. Use to check blood pressure three times a week. ICD10 I10   budesonide -formoterol  (SYMBICORT ) 80-4.5 MCG/ACT inhaler Symbicort    cetirizine  (ZYRTEC ) 10 MG tablet Take 1 tablet (10 mg total) by mouth daily.   fluticasone  (FLONASE ) 50 MCG/ACT nasal spray Place 2 sprays into both nostrils daily.   hydrochlorothiazide  (HYDRODIURIL ) 25 MG tablet TAKE 1 TABLET BY MOUTH DAILY   ketorolac  (ACULAR ) 0.5 % ophthalmic solution Place 1 drop into the left eye 4 (four) times daily.   latanoprost  (XALATAN ) 0.005 % ophthalmic solution Place 1 drop into both eyes nightly.   latanoprost  (XALATAN ) 0.005 % ophthalmic solution    loratadine  (CLARITIN ) 5 MG chewable tablet Claritin    losartan  (COZAAR ) 100 MG tablet TAKE 1 TABLET BY MOUTH EVERY DAY   meloxicam  (MOBIC ) 7.5 MG tablet TAKE 1 TABLET BY MOUTH DAILY   metFORMIN  (GLUCOPHAGE ) 500 MG tablet TAKE 1 TABLET BY MOUTH DAILY  WITH BREAKFAST   montelukast  (SINGULAIR ) 10 MG tablet TAKE 1 TABLET BY MOUTH  AT  BEDTIME   omeprazole  (PRILOSEC ) 20 MG capsule Take 1 capsule (20 mg total) by mouth daily.   phentermine  37.5 MG capsule Take 1 capsule (37.5 mg total) by mouth every morning.   prednisoLONE  acetate (PRED FORTE ) 1 % ophthalmic suspension Place 1 drop into the left eye 4 (four) times daily. Start using day after surgery as instructed   predniSONE  (STERAPRED UNI-PAK 21 TAB) 10 MG (21) TBPK tablet TAKE PO DAILY AS DIRECTED   SYMBICORT  80-4.5 MCG/ACT inhaler INHALE 2 INHALATIONS BY MOUTH IN THE MORNING AND AT BEDTIME   Timolol-Dorzolamid-Latanoprost  0.5-0.15-0.005 % SOLN Apply to eye.   traMADol  (ULTRAM ) 50 MG tablet Take 1 tablet (50 mg total) by mouth 2 (two) times daily as needed for severe pain (pain score 7-10).   No facility-administered encounter medications on file as of 05/14/2024.    Past Medical History:  Diagnosis Date   Allergy    Arthritis    Deviated septum    Diabetes mellitus without complication (HCC)    GERD (gastroesophageal reflux disease)    Hypertension     Past Surgical History:  Procedure Laterality Date   ETHMOIDECTOMY Right 05/17/2018   Procedure: RIGHT ETHMOIDECTOMY WITH TISSUE REMOVAL;  Surgeon: Karis Clunes, MD;  Location: [facility];  Service: ENT;  Laterality: Right;   FRONTAL SINUS EXPLORATION Right 05/17/2018   Procedure: RIGHT FRONTAL RECESS EXPLORATION;  Surgeon: Karis Clunes, MD;  [facility];  Service: ENT;  Laterality: Right;   MAXILLARY ANTROSTOMY Right 05/17/2018   Procedure: RIGHT MAXILLARY ANTROSTOMY WITH TISSUE REMOVAL;  Surgeon: Karis Clunes, MD;  Location: [facility];  Service: ENT;  Laterality: Right;   NASAL SEPTOPLASTY W/ TURBINOPLASTY Bilateral 05/17/2018   Procedure: NASAL SEPTOPLASTY WITH TURBINATE REDUCTION;  Surgeon: Karis Clunes, MD;  Location: [facility];  Service: ENT;  Laterality: Bilateral;   SINUS ENDO WITH FUSION N/A 05/17/2018   Procedure: SINUS ENDO WITH FUSION;  Surgeon: Karis Clunes, MD;  Location: [facility];  Service: ENT;  Laterality: N/A;   SPHENOIDECTOMY Right 05/17/2018   Procedure: RIGHT SPHENOIDECTOMY WITH TISSUE REMOVAL;  Surgeon: Karis Clunes, MD;  Location: [facility];  Service: ENT;  Laterality: Right;   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   Procedure: TOTAL HIP ARTHROPLASTY;  Surgeon: Anderson Maude ORN, MD;  Location: [facility];  Service: Orthopedics;  Laterality: Left;   TUBAL LIGATION      Family History  Problem Relation Age of Onset   Asthma Other    Cancer Other    Hypertension Other    Diabetes Other    Colon cancer Maternal Grandmother    Breast cancer Neg Hx    Esophageal cancer Neg Hx    Rectal cancer Neg Hx    Stomach cancer Neg Hx     Social History   Socioeconomic History   Marital status: Widowed    Spouse name: Not on file   Number of children: Not on file   Years of education: Not on file   Highest education level: Some college, no degree  Occupational History   Not on file  Tobacco Use   Smoking status: Never   Smokeless tobacco: Never  Vaping Use   Vaping status: Never Used  Substance and Sexual Activity   Alcohol use: No   Drug use: Never   Sexual activity: Not on file  Other Topics Concern   Not on file  Social History Narrative   Not on file   Social Drivers of Health   Financial Resource Strain: Low Risk  (02/23/2024)   Overall Financial Resource Strain (CARDIA)    Difficulty of Paying Living Expenses: Not very hard  Food Insecurity: No Food Insecurity (02/23/2024)   Hunger Vital Sign    Worried About Running Out of Food in the Last Year: Never true    Ran Out of Food in the Last Year: Never true  Transportation Needs: No Transportation Needs (02/23/2024)   PRAPARE - Administrator, Civil Service (Medical): No    Lack of Transportation (Non-Medical): No  Physical Activity: Insufficiently Active (02/23/2024)   Exercise Vital Sign    Days of Exercise per Week: 3 days     Minutes of Exercise per Session: 30 min  Stress: No Stress Concern Present (02/23/2024)   [employer] of Occupational Health - Occupational Stress Questionnaire    Feeling of Stress : Not at all  Social Connections: Moderately Integrated (02/23/2024)   Social Connection and Isolation Panel    Frequency of Communication with Friends and Family: More than three times a week    Frequency of Social Gatherings with Friends and Family: More than three times a week    Attends Religious Services: More than 4 times per year    Active Member of Clubs or Organizations: No    Attends Engineer, structural: More than 4 times per year  Marital Status: Widowed  Recent Concern: Social Connections - Moderately Isolated (01/25/2024)   Social Connection and Isolation Panel    Frequency of Communication with Friends and Family: More than three times a week    Frequency of Social Gatherings with Friends and Family: More than three times a week    Attends Religious Services: More than 4 times per year    Active Member of [employer] or Organizations: No    Attends Banker Meetings: Not on file    Marital Status: Widowed  Intimate Partner Violence: Unknown (02/23/2024)   Humiliation, Afraid, Rape, and Kick questionnaire    Fear of Current or Ex-Partner: No    Emotionally Abused: No    Physically Abused: Not on file    Sexually Abused: No    Review of Systems  All other systems reviewed and are negative.       Objective    BP 96/64   Pulse 65   Temp 98.1 F (36.7 C) (Oral)   Resp 16   Wt 159 lb 6.4 oz (72.3 kg)   SpO2 96%   BMI 28.69 kg/m   Physical Exam Vitals and nursing note reviewed.  Constitutional:      General: She is not in acute distress.  Cardiovascular:     Rate and Rhythm: Normal rate and regular rhythm.  Pulmonary:     Effort: Pulmonary effort is normal.     Breath sounds: Normal breath sounds.  Abdominal:     Palpations: Abdomen is soft.      Tenderness: There is no abdominal tenderness.   Musculoskeletal:     Right shoulder: Tenderness present. No swelling or deformity. Decreased range of motion.   Neurological:     General: No focal deficit present.     Mental Status: She is alert and oriented to person, place, and time.         Assessment & Plan:   Type 2 diabetes mellitus without complication, without long-term current use of insulin  (HCC) -     POCT glycosylated hemoglobin (Hb A1C)  Right shoulder pain, unspecified chronicity  Other orders -     predniSONE ; TAKE PO DAILY AS DIRECTED  Dispense: 21 tablet; Refill: 0     Return in about 6 months (around 11/13/2024).   Tanda Raguel SQUIBB, MD

## 2024-06-14 ENCOUNTER — Ambulatory Visit (INDEPENDENT_AMBULATORY_CARE_PROVIDER_SITE_OTHER): Admitting: Family Medicine

## 2024-06-14 ENCOUNTER — Other Ambulatory Visit (HOSPITAL_COMMUNITY): Payer: Self-pay

## 2024-06-14 VITALS — BP 103/67 | HR 71 | Ht 62.5 in | Wt 161.2 lb

## 2024-06-14 DIAGNOSIS — E663 Overweight: Secondary | ICD-10-CM | POA: Diagnosis not present

## 2024-06-14 DIAGNOSIS — Z6829 Body mass index (BMI) 29.0-29.9, adult: Secondary | ICD-10-CM | POA: Diagnosis not present

## 2024-06-14 DIAGNOSIS — Z7689 Persons encountering health services in other specified circumstances: Secondary | ICD-10-CM

## 2024-06-14 MED ORDER — PHENTERMINE HCL 37.5 MG PO CAPS
37.5000 mg | ORAL_CAPSULE | ORAL | 0 refills | Status: DC
  Filled 2024-06-14: qty 30, 30d supply, fill #0

## 2024-06-14 NOTE — Progress Notes (Signed)
 Established Patient Office Visit  Subjective    Patient ID: Barbara Adkins, female    DOB: 1951/09/01  Age: 73 y.o. MRN: [id number]  CC:  Chief Complaint  Patient presents with   Medical Management of Chronic Issues    HPI [name] presents for routine weight management. Patient denies acute complaints.   Outpatient Encounter Medications as of 06/14/2024  Medication Sig   acetaminophen  (TYLENOL ) 325 MG tablet Take 2 tablets (650 mg total) by mouth every 6 (six) hours.   albuterol  (VENTOLIN  HFA) 108 (90 Base) MCG/ACT inhaler Inhale 2 puffs into the lungs every 4 (four) hours.   amLODipine  (NORVASC ) 10 MG tablet TAKE 1 TABLET BY MOUTH DAILY   atorvastatin  (LIPITOR) 10 MG tablet Atorvastatin  Calcium    atorvastatin  (LIPITOR) 20 MG tablet TAKE 1 TABLET BY MOUTH ONCE  DAILY   Blood Pressure Monitoring (BLOOD PRESSURE KIT) DEVI 1 Device by Does not apply route 3 (three) times a week. Use to check blood pressure three times a week. ICD10 I10   budesonide -formoterol  (SYMBICORT ) 80-4.5 MCG/ACT inhaler Symbicort    cetirizine  (ZYRTEC ) 10 MG tablet Take 1 tablet (10 mg total) by mouth daily.   fluticasone  (FLONASE ) 50 MCG/ACT nasal spray Place 2 sprays into both nostrils daily.   hydrochlorothiazide  (HYDRODIURIL ) 25 MG tablet TAKE 1 TABLET BY MOUTH DAILY   ketorolac  (ACULAR ) 0.5 % ophthalmic solution Place 1 drop into the left eye 4 (four) times daily.   latanoprost  (XALATAN ) 0.005 % ophthalmic solution Place 1 drop into both eyes nightly.   latanoprost  (XALATAN ) 0.005 % ophthalmic solution    loratadine  (CLARITIN ) 5 MG chewable tablet Claritin    losartan  (COZAAR ) 100 MG tablet TAKE 1 TABLET BY MOUTH EVERY DAY   meloxicam  (MOBIC ) 7.5 MG tablet TAKE 1 TABLET BY MOUTH DAILY   metFORMIN  (GLUCOPHAGE ) 500 MG tablet TAKE 1 TABLET BY MOUTH DAILY  WITH BREAKFAST   montelukast  (SINGULAIR ) 10 MG tablet TAKE 1 TABLET BY MOUTH AT  BEDTIME   omeprazole  (PRILOSEC ) 20 MG capsule Take 1  capsule (20 mg total) by mouth daily.   prednisoLONE  acetate (PRED FORTE ) 1 % ophthalmic suspension Place 1 drop into the left eye 4 (four) times daily. Start using day after surgery as instructed   predniSONE  (STERAPRED UNI-PAK 21 TAB) 10 MG (21) TBPK tablet TAKE PO DAILY AS DIRECTED   SYMBICORT  80-4.5 MCG/ACT inhaler INHALE 2 INHALATIONS BY MOUTH IN THE MORNING AND AT BEDTIME   Timolol-Dorzolamid-Latanoprost  0.5-0.15-0.005 % SOLN Apply to eye.   traMADol  (ULTRAM ) 50 MG tablet Take 1 tablet (50 mg total) by mouth 2 (two) times daily as needed for severe pain (pain score 7-10).   [DISCONTINUED] phentermine  37.5 MG capsule Take 1 capsule (37.5 mg total) by mouth every morning.   phentermine  37.5 MG capsule Take 1 capsule (37.5 mg total) by mouth every morning.   No facility-administered encounter medications on file as of 06/14/2024.    Past Medical History:  Diagnosis Date   Allergy    Arthritis    Deviated septum    Diabetes mellitus without complication (HCC)    GERD (gastroesophageal reflux disease)    Hypertension     Past Surgical History:  Procedure Laterality Date   ETHMOIDECTOMY Right 05/17/2018   Procedure: RIGHT ETHMOIDECTOMY WITH TISSUE REMOVAL;  Surgeon: Karis Clunes, MD;  Location: [facility];  Service: ENT;  Laterality: Right;   FRONTAL SINUS EXPLORATION Right 05/17/2018   Procedure: RIGHT FRONTAL RECESS EXPLORATION;  Surgeon: Karis Clunes, MD;  Location: [facility];  Service: ENT;  Laterality: Right;   MAXILLARY ANTROSTOMY Right 05/17/2018   Procedure: RIGHT MAXILLARY ANTROSTOMY WITH TISSUE REMOVAL;  Surgeon: Karis Clunes, MD;  Location: [facility];  Service: ENT;  Laterality: Right;   NASAL SEPTOPLASTY W/ TURBINOPLASTY Bilateral 05/17/2018   Procedure: NASAL SEPTOPLASTY WITH TURBINATE REDUCTION;  Surgeon: Karis Clunes, MD;  Location: [facility];  Service: ENT;  Laterality: Bilateral;   SINUS ENDO WITH FUSION N/A 05/17/2018   Procedure:  SINUS ENDO WITH FUSION;  Surgeon: Karis Clunes, MD;  Location: [facility];  Service: ENT;  Laterality: N/A;   SPHENOIDECTOMY Right 05/17/2018   Procedure: RIGHT SPHENOIDECTOMY WITH TISSUE REMOVAL;  Surgeon: Karis Clunes, MD;  Location: [facility];  Service: ENT;  Laterality: Right;   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   Procedure: TOTAL HIP ARTHROPLASTY;  Surgeon: Anderson Maude ORN, MD;  Location: [facility];  Service: Orthopedics;  Laterality: Left;   TUBAL LIGATION      Family History  Problem Relation Age of Onset   Asthma Other    Cancer Other    Hypertension Other    Diabetes Other    Colon cancer Maternal Grandmother    Breast cancer Neg Hx    Esophageal cancer Neg Hx    Rectal cancer Neg Hx    Stomach cancer Neg Hx     Social History   Socioeconomic History   Marital status: Widowed    Spouse name: Not on file   Number of children: Not on file   Years of education: Not on file   Highest education level: Some college, no degree  Occupational History   Not on file  Tobacco Use   Smoking status: Never   Smokeless tobacco: Never  Vaping Use   Vaping status: Never Used  Substance and Sexual Activity   Alcohol use: No   Drug use: Never   Sexual activity: Not on file  Other Topics Concern   Not on file  Social History Narrative   Not on file   Social Drivers of Health   Financial Resource Strain: Low Risk  (06/14/2024)   Overall Financial Resource Strain (CARDIA)    Difficulty of Paying Living Expenses: Not very hard  Food Insecurity: No Food Insecurity (06/14/2024)   Hunger Vital Sign    Worried About Running Out of Food in the Last Year: Never true    Ran Out of Food in the Last Year: Never true  Transportation Needs: No Transportation Needs (06/14/2024)   PRAPARE - Administrator, Civil Service (Medical): No    Lack of Transportation (Non-Medical): No  Physical Activity: Insufficiently Active (06/14/2024)   Exercise Vital Sign     Days of Exercise per Week: 3 days    Minutes of Exercise per Session: 30 min  Stress: No Stress Concern Present (06/14/2024)   [employer] of Occupational Health - Occupational Stress Questionnaire    Feeling of Stress: Not at all  Social Connections: Unknown (06/14/2024)   Social Connection and Isolation Panel    Frequency of Communication with Friends and Family: More than three times a week    Frequency of Social Gatherings with Friends and Family: More than three times a week    Attends Religious Services: 1 to 4 times per year    Active Member of [employer] or Organizations: Not on file    Attends Banker Meetings: Not on file  Marital Status: Widowed  Intimate Partner Violence: Unknown (02/23/2024)   Humiliation, Afraid, Rape, and Kick questionnaire    Fear of Current or Ex-Partner: No    Emotionally Abused: No    Physically Abused: Not on file    Sexually Abused: No    Review of Systems  All other systems reviewed and are negative.       Objective    BP 103/67   Pulse 71   Ht 5' 2.5 (1.588 m)   Wt 161 lb 3.2 oz (73.1 kg)   SpO2 95%   BMI 29.01 kg/m   Physical Exam Vitals and nursing note reviewed.  Constitutional:      General: She is not in acute distress. Cardiovascular:     Rate and Rhythm: Normal rate and regular rhythm.  Pulmonary:     Effort: Pulmonary effort is normal.     Breath sounds: Normal breath sounds.  Abdominal:     Palpations: Abdomen is soft.     Tenderness: There is no abdominal tenderness.  Neurological:     General: No focal deficit present.     Mental Status: She is alert and oriented to person, place, and time.         Assessment & Plan:   Encounter for weight management  Overweight (BMI 25.0-29.9)  Other orders -     Phentermine  HCl; Take 1 capsule (37.5 mg total) by mouth every morning.  Dispense: 30 capsule; Refill: 0  Discussed with patient that she will need to lose 2-3 lbs this month to be  considered for further weight management.   No follow-ups on file.   Tanda Raguel SQUIBB, MD

## 2024-06-16 ENCOUNTER — Other Ambulatory Visit: Payer: Self-pay | Admitting: Family Medicine

## 2024-07-02 ENCOUNTER — Other Ambulatory Visit: Payer: Self-pay | Admitting: Adult Health Nurse Practitioner

## 2024-07-02 DIAGNOSIS — Z1231 Encounter for screening mammogram for malignant neoplasm of breast: Secondary | ICD-10-CM

## 2024-07-04 ENCOUNTER — Other Ambulatory Visit: Payer: Self-pay | Admitting: Family Medicine

## 2024-07-24 ENCOUNTER — Other Ambulatory Visit: Payer: Self-pay | Admitting: Family Medicine

## 2024-07-24 ENCOUNTER — Other Ambulatory Visit (HOSPITAL_COMMUNITY): Payer: Self-pay

## 2024-07-24 ENCOUNTER — Encounter: Payer: Self-pay | Admitting: Family Medicine

## 2024-07-24 ENCOUNTER — Telehealth: Payer: Self-pay | Admitting: Family Medicine

## 2024-07-24 ENCOUNTER — Ambulatory Visit (INDEPENDENT_AMBULATORY_CARE_PROVIDER_SITE_OTHER): Admitting: Family Medicine

## 2024-07-24 VITALS — BP 110/72 | HR 72 | Ht 62.5 in | Wt 159.2 lb

## 2024-07-24 DIAGNOSIS — E663 Overweight: Secondary | ICD-10-CM | POA: Diagnosis not present

## 2024-07-24 DIAGNOSIS — Z6828 Body mass index (BMI) 28.0-28.9, adult: Secondary | ICD-10-CM

## 2024-07-24 DIAGNOSIS — Z7689 Persons encountering health services in other specified circumstances: Secondary | ICD-10-CM

## 2024-07-24 MED ORDER — PHENTERMINE HCL 37.5 MG PO CAPS: 37.5000 mg | ORAL_CAPSULE | ORAL | 0 refills | Status: DC

## 2024-07-24 MED ORDER — PHENTERMINE HCL 37.5 MG PO CAPS
37.5000 mg | ORAL_CAPSULE | ORAL | 0 refills | Status: DC
  Filled 2024-07-24: qty 30, 30d supply, fill #0

## 2024-07-24 NOTE — Telephone Encounter (Signed)
 Copied from CRM #[id number]. Topic: Clinical - Prescription Issue >> Jul 24, 2024  3:50 PM Dedra B wrote: Reason for CRM: Pt would like her phentermine  prescription sent to [facility] instead of [facility] because it's cheaper at [facility] [employer].

## 2024-07-25 ENCOUNTER — Encounter: Payer: Self-pay | Admitting: Family Medicine

## 2024-07-25 NOTE — Progress Notes (Signed)
 Established Patient Office Visit  Subjective    Patient ID: Barbara Adkins, female    DOB: 1951/02/05  Age: 73 y.o. MRN: [id number]  CC:  Chief Complaint  Patient presents with   Medical Management of Chronic Issues    HPI Barbara Adkins presents for routine weight management. Patient denies acute complaints.   Outpatient Encounter Medications as of 07/24/2024  Medication Sig   acetaminophen  (TYLENOL ) 325 MG tablet Take 2 tablets (650 mg total) by mouth every 6 (six) hours.   albuterol  (VENTOLIN  HFA) 108 (90 Base) MCG/ACT inhaler Inhale 2 puffs into the lungs every 4 (four) hours.   amLODipine  (NORVASC ) 10 MG tablet TAKE 1 TABLET BY MOUTH DAILY   atorvastatin  (LIPITOR) 10 MG tablet Atorvastatin  Calcium    atorvastatin  (LIPITOR) 20 MG tablet TAKE 1 TABLET BY MOUTH ONCE  DAILY   Blood Pressure Monitoring (BLOOD PRESSURE KIT) DEVI 1 Device by Does not apply route 3 (three) times a week. Use to check blood pressure three times a week. ICD10 I10   budesonide -formoterol  (SYMBICORT ) 80-4.5 MCG/ACT inhaler Symbicort    cetirizine  (ZYRTEC ) 10 MG tablet Take 1 tablet (10 mg total) by mouth daily.   fluticasone  (FLONASE ) 50 MCG/ACT nasal spray Place 2 sprays into both nostrils daily.   hydrochlorothiazide  (HYDRODIURIL ) 25 MG tablet TAKE 1 TABLET BY MOUTH DAILY   ketorolac  (ACULAR ) 0.5 % ophthalmic solution Place 1 drop into the left eye 4 (four) times daily.   latanoprost  (XALATAN ) 0.005 % ophthalmic solution Place 1 drop into both eyes nightly.   latanoprost  (XALATAN ) 0.005 % ophthalmic solution    loratadine  (CLARITIN ) 5 MG chewable tablet Claritin    losartan  (COZAAR ) 100 MG tablet TAKE 1 TABLET BY MOUTH EVERY DAY   meloxicam  (MOBIC ) 7.5 MG tablet TAKE 1 TABLET BY MOUTH DAILY   metFORMIN  (GLUCOPHAGE ) 500 MG tablet TAKE 1 TABLET BY MOUTH DAILY  WITH BREAKFAST   montelukast  (SINGULAIR ) 10 MG tablet TAKE 1 TABLET BY MOUTH AT  BEDTIME   omeprazole  (PRILOSEC ) 20 MG capsule Take 1  capsule (20 mg total) by mouth daily.   prednisoLONE  acetate (PRED FORTE ) 1 % ophthalmic suspension Place 1 drop into the left eye 4 (four) times daily. Start using day after surgery as instructed   predniSONE  (STERAPRED UNI-PAK 21 TAB) 10 MG (21) TBPK tablet TAKE PO DAILY AS DIRECTED   SYMBICORT  80-4.5 MCG/ACT inhaler INHALE 2 INHALATIONS BY MOUTH IN THE MORNING AND AT BEDTIME   Timolol-Dorzolamid-Latanoprost  0.5-0.15-0.005 % SOLN Apply to eye.   traMADol  (ULTRAM ) 50 MG tablet Take 1 tablet (50 mg total) by mouth 2 (two) times daily as needed for severe pain (pain score 7-10).   [DISCONTINUED] phentermine  37.5 MG capsule Take 1 capsule (37.5 mg total) by mouth every morning.   [DISCONTINUED] phentermine  37.5 MG capsule Take 1 capsule (37.5 mg total) by mouth every morning.   [DISCONTINUED] phentermine  37.5 MG capsule Take 1 capsule (37.5 mg total) by mouth every morning.   No facility-administered encounter medications on file as of 07/24/2024.    Past Medical History:  Diagnosis Date   Allergy    Arthritis    Deviated septum    Diabetes mellitus without complication (HCC)    GERD (gastroesophageal reflux disease)    Hypertension     Past Surgical History:  Procedure Laterality Date   ETHMOIDECTOMY Right 05/17/2018   Procedure: RIGHT ETHMOIDECTOMY WITH TISSUE REMOVAL;  Surgeon: Karis Clunes, MD;  Location: [facility];  Service: ENT;  Laterality: Right;  FRONTAL SINUS EXPLORATION Right 05/17/2018   Procedure: RIGHT FRONTAL RECESS EXPLORATION;  Surgeon: Karis Clunes, MD;  Location: [facility];  Service: ENT;  Laterality: Right;   MAXILLARY ANTROSTOMY Right 05/17/2018   Procedure: RIGHT MAXILLARY ANTROSTOMY WITH TISSUE REMOVAL;  Surgeon: Karis Clunes, MD;  Location: [facility];  Service: ENT;  Laterality: Right;   NASAL SEPTOPLASTY W/ TURBINOPLASTY Bilateral 05/17/2018   Procedure: NASAL SEPTOPLASTY WITH TURBINATE REDUCTION;  Surgeon: Karis Clunes, MD;  Location:  [facility];  Service: ENT;  Laterality: Bilateral;   SINUS ENDO WITH FUSION N/A 05/17/2018   Procedure: SINUS ENDO WITH FUSION;  Surgeon: Karis Clunes, MD;  Location: [facility];  Service: ENT;  Laterality: N/A;   SPHENOIDECTOMY Right 05/17/2018   Procedure: RIGHT SPHENOIDECTOMY WITH TISSUE REMOVAL;  Surgeon: Karis Clunes, MD;  Location: [facility];  Service: ENT;  Laterality: Right;   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   Procedure: TOTAL HIP ARTHROPLASTY;  Surgeon: Anderson Maude ORN, MD;  Location: [facility];  Service: Orthopedics;  Laterality: Left;   TUBAL LIGATION      Family History  Problem Relation Age of Onset   Asthma Other    Cancer Other    Hypertension Other    Diabetes Other    Colon cancer Maternal Grandmother    Breast cancer Neg Hx    Esophageal cancer Neg Hx    Rectal cancer Neg Hx    Stomach cancer Neg Hx     Social History   Socioeconomic History   Marital status: Widowed    Spouse name: Not on file   Number of children: Not on file   Years of education: Not on file   Highest education level: Some college, no degree  Occupational History   Not on file  Tobacco Use   Smoking status: Never   Smokeless tobacco: Never  Vaping Use   Vaping status: Never Used  Substance and Sexual Activity   Alcohol use: No   Drug use: Never   Sexual activity: Not on file  Other Topics Concern   Not on file  Social History Narrative   Not on file   Social Drivers of Health   Financial Resource Strain: Low Risk  (06/14/2024)   Overall Financial Resource Strain (CARDIA)    Difficulty of Paying Living Expenses: Not very hard  Food Insecurity: No Food Insecurity (06/14/2024)   Hunger Vital Sign    Worried About Running Out of Food in the Last Year: Never true    Ran Out of Food in the Last Year: Never true  Transportation Needs: No Transportation Needs (06/14/2024)   PRAPARE - Administrator, Civil Service (Medical): No     Lack of Transportation (Non-Medical): No  Physical Activity: Insufficiently Active (06/14/2024)   Exercise Vital Sign    Days of Exercise per Week: 3 days    Minutes of Exercise per Session: 30 min  Stress: No Stress Concern Present (06/14/2024)   [employer] of Occupational Health - Occupational Stress Questionnaire    Feeling of Stress: Not at all  Social Connections: Unknown (06/14/2024)   Social Connection and Isolation Panel    Frequency of Communication with Friends and Family: More than three times a week    Frequency of Social Gatherings with Friends and Family: More than three times a week    Attends Religious Services: 1 to 4 times per year    Active Member of Clubs or  Organizations: Not on file    Attends Banker Meetings: Not on file    Marital Status: Widowed  Intimate Partner Violence: Unknown (02/23/2024)   Humiliation, Afraid, Rape, and Kick questionnaire    Fear of Current or Ex-Partner: No    Emotionally Abused: No    Physically Abused: Not on file    Sexually Abused: No    Review of Systems  All other systems reviewed and are negative.       Objective    BP 110/72   Pulse 72   Ht 5' 2.5 (1.588 m)   Wt 159 lb 3.2 oz (72.2 kg)   SpO2 92%   BMI 28.65 kg/m   Physical Exam Vitals and nursing note reviewed.  Constitutional:      General: She is not in acute distress. Cardiovascular:     Rate and Rhythm: Normal rate and regular rhythm.  Pulmonary:     Effort: Pulmonary effort is normal.     Breath sounds: Normal breath sounds.  Abdominal:     Palpations: Abdomen is soft.     Tenderness: There is no abdominal tenderness.  Neurological:     General: No focal deficit present.     Mental Status: She is alert and oriented to person, place, and time.         Assessment & Plan:   Encounter for weight management  Overweight (BMI 25.0-29.9)     Return in about 4 weeks (around 08/21/2024) for follow up, weight management.    Tanda Raguel SQUIBB, MD

## 2024-08-02 ENCOUNTER — Ambulatory Visit
Admission: RE | Admit: 2024-08-02 | Discharge: 2024-08-02 | Disposition: A | Discharge status: Home / Self Care | Source: Ambulatory Visit | Attending: Adult Health Nurse Practitioner | Admitting: Adult Health Nurse Practitioner

## 2024-08-02 DIAGNOSIS — Z1231 Encounter for screening mammogram for malignant neoplasm of breast: Secondary | ICD-10-CM

## 2024-09-05 ENCOUNTER — Ambulatory Visit: Admitting: Family Medicine

## 2024-09-05 ENCOUNTER — Other Ambulatory Visit (HOSPITAL_COMMUNITY): Payer: Self-pay

## 2024-09-05 ENCOUNTER — Encounter: Payer: Self-pay | Admitting: Family Medicine

## 2024-09-05 VITALS — BP 119/79 | HR 82 | Ht 62.5 in | Wt 155.8 lb

## 2024-09-05 DIAGNOSIS — Z6828 Body mass index (BMI) 28.0-28.9, adult: Secondary | ICD-10-CM

## 2024-09-05 DIAGNOSIS — E663 Overweight: Secondary | ICD-10-CM | POA: Diagnosis not present

## 2024-09-05 DIAGNOSIS — Z7689 Persons encountering health services in other specified circumstances: Secondary | ICD-10-CM

## 2024-09-05 MED ORDER — PHENTERMINE HCL 37.5 MG PO CAPS
37.5000 mg | ORAL_CAPSULE | ORAL | 0 refills | Status: DC
  Filled 2024-09-05: qty 30, 30d supply, fill #0

## 2024-09-05 NOTE — Progress Notes (Signed)
 Established Patient Office Visit  Subjective    Patient ID: Barbara Adkins, female    DOB: 1951-05-18  Age: 73 y.o. MRN: [id number]  CC:  Chief Complaint  Patient presents with   Medical Management of Chronic Issues    HPI Barbara Adkins presents for routine weight management. Patient denies acute complaints.   Outpatient Encounter Medications as of 09/05/2024  Medication Sig   acetaminophen  (TYLENOL ) 325 MG tablet Take 2 tablets (650 mg total) by mouth every 6 (six) hours.   albuterol  (VENTOLIN  HFA) 108 (90 Base) MCG/ACT inhaler Inhale 2 puffs into the lungs every 4 (four) hours.   amLODipine  (NORVASC ) 10 MG tablet TAKE 1 TABLET BY MOUTH DAILY   atorvastatin  (LIPITOR) 10 MG tablet Atorvastatin  Calcium    atorvastatin  (LIPITOR) 20 MG tablet TAKE 1 TABLET BY MOUTH ONCE  DAILY   Blood Pressure Monitoring (BLOOD PRESSURE KIT) DEVI 1 Device by Does not apply route 3 (three) times a week. Use to check blood pressure three times a week. ICD10 I10   budesonide -formoterol  (SYMBICORT ) 80-4.5 MCG/ACT inhaler Symbicort    cetirizine  (ZYRTEC ) 10 MG tablet Take 1 tablet (10 mg total) by mouth daily.   fluticasone  (FLONASE ) 50 MCG/ACT nasal spray Place 2 sprays into both nostrils daily.   hydrochlorothiazide  (HYDRODIURIL ) 25 MG tablet TAKE 1 TABLET BY MOUTH DAILY   ketorolac  (ACULAR ) 0.5 % ophthalmic solution Place 1 drop into the left eye 4 (four) times daily.   latanoprost  (XALATAN ) 0.005 % ophthalmic solution Place 1 drop into both eyes nightly.   latanoprost  (XALATAN ) 0.005 % ophthalmic solution    loratadine  (CLARITIN ) 5 MG chewable tablet Claritin    losartan  (COZAAR ) 100 MG tablet TAKE 1 TABLET BY MOUTH EVERY DAY   meloxicam  (MOBIC ) 7.5 MG tablet TAKE 1 TABLET BY MOUTH DAILY   metFORMIN  (GLUCOPHAGE ) 500 MG tablet TAKE 1 TABLET BY MOUTH DAILY  WITH BREAKFAST   montelukast  (SINGULAIR ) 10 MG tablet TAKE 1 TABLET BY MOUTH AT  BEDTIME   omeprazole  (PRILOSEC ) 20 MG capsule Take 1  capsule (20 mg total) by mouth daily.   prednisoLONE  acetate (PRED FORTE ) 1 % ophthalmic suspension Place 1 drop into the left eye 4 (four) times daily. Start using day after surgery as instructed   predniSONE  (STERAPRED UNI-PAK 21 TAB) 10 MG (21) TBPK tablet TAKE PO DAILY AS DIRECTED   SYMBICORT  80-4.5 MCG/ACT inhaler INHALE 2 INHALATIONS BY MOUTH IN THE MORNING AND AT BEDTIME   Timolol-Dorzolamid-Latanoprost  0.5-0.15-0.005 % SOLN Apply to eye.   traMADol  (ULTRAM ) 50 MG tablet Take 1 tablet (50 mg total) by mouth 2 (two) times daily as needed for severe pain (pain score 7-10).   [DISCONTINUED] phentermine  37.5 MG capsule Take 1 capsule (37.5 mg total) by mouth every morning.   phentermine  37.5 MG capsule Take 1 capsule (37.5 mg total) by mouth every morning.   No facility-administered encounter medications on file as of 09/05/2024.    Past Medical History:  Diagnosis Date   Allergy    Arthritis    Deviated septum    Diabetes mellitus without complication (HCC)    GERD (gastroesophageal reflux disease)    Hypertension     Past Surgical History:  Procedure Laterality Date   ETHMOIDECTOMY Right 05/17/2018   Procedure: RIGHT ETHMOIDECTOMY WITH TISSUE REMOVAL;  Surgeon: Karis Clunes, MD;  Location: [facility];  Service: ENT;  Laterality: Right;   FRONTAL SINUS EXPLORATION Right 05/17/2018   Procedure: RIGHT FRONTAL RECESS EXPLORATION;  Surgeon: Karis Clunes, MD;  Location: [facility];  Service: ENT;  Laterality: Right;   MAXILLARY ANTROSTOMY Right 05/17/2018   Procedure: RIGHT MAXILLARY ANTROSTOMY WITH TISSUE REMOVAL;  Surgeon: Karis Clunes, MD;  Location: [facility];  Service: ENT;  Laterality: Right;   NASAL SEPTOPLASTY W/ TURBINOPLASTY Bilateral 05/17/2018   Procedure: NASAL SEPTOPLASTY WITH TURBINATE REDUCTION;  Surgeon: Karis Clunes, MD;  Location: [facility];  Service: ENT;  Laterality: Bilateral;   SINUS ENDO WITH FUSION N/A 05/17/2018    Procedure: SINUS ENDO WITH FUSION;  Surgeon: Karis Clunes, MD;  Location: [facility];  Service: ENT;  Laterality: N/A;   SPHENOIDECTOMY Right 05/17/2018   Procedure: RIGHT SPHENOIDECTOMY WITH TISSUE REMOVAL;  Surgeon: Karis Clunes, MD;  Location: [facility];  Service: ENT;  Laterality: Right;   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   Procedure: TOTAL HIP ARTHROPLASTY;  Surgeon: Anderson Maude ORN, MD;  Location: [facility];  Service: Orthopedics;  Laterality: Left;   TUBAL LIGATION      Family History  Problem Relation Age of Onset   Asthma Other    Cancer Other    Hypertension Other    Diabetes Other    Colon cancer Maternal Grandmother    Breast cancer Neg Hx    Esophageal cancer Neg Hx    Rectal cancer Neg Hx    Stomach cancer Neg Hx     Social History   Socioeconomic History   Marital status: Widowed    Spouse name: Not on file   Number of children: Not on file   Years of education: Not on file   Highest education level: Some college, no degree  Occupational History   Not on file  Tobacco Use   Smoking status: Never   Smokeless tobacco: Never  Vaping Use   Vaping status: Never Used  Substance and Sexual Activity   Alcohol use: No   Drug use: Never   Sexual activity: Not on file  Other Topics Concern   Not on file  Social History Narrative   Not on file   Social Drivers of Health   Financial Resource Strain: Low Risk  (06/14/2024)   Overall Financial Resource Strain (CARDIA)    Difficulty of Paying Living Expenses: Not very hard  Food Insecurity: No Food Insecurity (06/14/2024)   Hunger Vital Sign    Worried About Running Out of Food in the Last Year: Never true    Ran Out of Food in the Last Year: Never true  Transportation Needs: No Transportation Needs (06/14/2024)   PRAPARE - Administrator, Civil Service (Medical): No    Lack of Transportation (Non-Medical): No  Physical Activity: Insufficiently Active (06/14/2024)   Exercise  Vital Sign    Days of Exercise per Week: 3 days    Minutes of Exercise per Session: 30 min  Stress: No Stress Concern Present (06/14/2024)   [employer] of Occupational Health - Occupational Stress Questionnaire    Feeling of Stress: Not at all  Social Connections: Unknown (06/14/2024)   Social Connection and Isolation Panel    Frequency of Communication with Friends and Family: More than three times a week    Frequency of Social Gatherings with Friends and Family: More than three times a week    Attends Religious Services: 1 to 4 times per year    Active Member of [employer] or Organizations: Not on file    Attends Banker Meetings: Not on file  Marital Status: Widowed  Intimate Partner Violence: Unknown (02/23/2024)   Humiliation, Afraid, Rape, and Kick questionnaire    Fear of Current or Ex-Partner: No    Emotionally Abused: No    Physically Abused: Not on file    Sexually Abused: No    Review of Systems  All other systems reviewed and are negative.       Objective    BP 119/79   Pulse 82   Ht 5' 2.5 (1.588 m)   Wt 155 lb 12.8 oz (70.7 kg)   SpO2 98%   BMI 28.04 kg/m   Physical Exam Vitals and nursing note reviewed.  Constitutional:      General: She is not in acute distress. Cardiovascular:     Rate and Rhythm: Normal rate and regular rhythm.  Pulmonary:     Effort: Pulmonary effort is normal.     Breath sounds: Normal breath sounds.  Abdominal:     Palpations: Abdomen is soft.     Tenderness: There is no abdominal tenderness.  Neurological:     General: No focal deficit present.     Mental Status: She is alert and oriented to person, place, and time.         Assessment & Plan:   Encounter for weight management  Overweight (BMI 25.0-29.9)  Other orders -     Phentermine  HCl; Take 1 capsule (37.5 mg total) by mouth every morning.  Dispense: 30 capsule; Refill: 0     Return in about 4 weeks (around 10/03/2024) for follow up,  weight management.   Tanda Raguel SQUIBB, MD

## 2024-10-05 ENCOUNTER — Other Ambulatory Visit: Payer: Self-pay | Admitting: Family Medicine

## 2024-10-05 NOTE — Telephone Encounter (Unsigned)
 Copied from CRM #[id number]. Topic: Clinical - Medication Refill >> Oct 05, 2024  4:30 PM Avram MATSU wrote: Medication: SYMBICORT  80-4.5 MCG/ACT inhaler [[id number]]  Has the patient contacted their pharmacy? Yes (Agent: If no, request that the patient contact the pharmacy for the refill. If patient does not wish to contact the pharmacy document the reason why and proceed with request.) (Agent: If yes, when and what did the pharmacy advise?)  This is the patient's preferred pharmacy:   [facility] - [place], [place] - [address] [place] [address] Phone: [phone] Fax: [phone]  Is this the correct pharmacy for this prescription? Yes If no, delete pharmacy and type the correct one.   Has the prescription been filled recently? No  Is the patient out of the medication? No  Has the patient been seen for an appointment in the last year OR does the patient have an upcoming appointment? Yes  Can we respond through MyChart? Yes  Agent: Please be advised that Rx refills may take up to 3 business days. We ask that you follow-up with your pharmacy.

## 2024-10-08 NOTE — Telephone Encounter (Signed)
 Requested medication (s) are due for refill today: routing for review  Requested medication (s) are on the active medication list: no  Last refill:  08/08/23  Future visit scheduled: yes  Notes to clinic:  Unable to refill per protocol, Rx expired.Historical medication.      Requested Prescriptions  Pending Prescriptions Disp Refills   budesonide -formoterol  (SYMBICORT ) 80-4.5 MCG/ACT inhaler 30.6 g 3     Pulmonology:  Combination Products Passed - 10/08/2024 11:00 AM      Passed - Valid encounter within last 12 months    Recent Outpatient Visits           1 month ago Encounter for weight management   [facility] [facility], [place]   2 months ago Encounter for weight management   [place] [facility], MD   3 months ago Encounter for weight management   [place] [facility] [place], [place]   4 months ago Type 2 diabetes mellitus without complication, without long-term current use of insulin  [facility])   [place] [facility], MD   6 months ago Encounter for weight management   [facility] [place] [facility], MD

## 2024-10-23 ENCOUNTER — Other Ambulatory Visit (HOSPITAL_COMMUNITY): Payer: Self-pay

## 2024-10-23 ENCOUNTER — Ambulatory Visit: Admitting: Family Medicine

## 2024-10-23 ENCOUNTER — Encounter: Payer: Self-pay | Admitting: Family Medicine

## 2024-10-23 VITALS — BP 110/71 | HR 67 | Ht 62.5 in | Wt 154.8 lb

## 2024-10-23 DIAGNOSIS — J4530 Mild persistent asthma, uncomplicated: Secondary | ICD-10-CM

## 2024-10-23 DIAGNOSIS — R7303 Prediabetes: Secondary | ICD-10-CM

## 2024-10-23 DIAGNOSIS — E663 Overweight: Secondary | ICD-10-CM

## 2024-10-23 DIAGNOSIS — E785 Hyperlipidemia, unspecified: Secondary | ICD-10-CM

## 2024-10-23 DIAGNOSIS — I10 Essential (primary) hypertension: Secondary | ICD-10-CM

## 2024-10-23 DIAGNOSIS — Z23 Encounter for immunization: Secondary | ICD-10-CM

## 2024-10-23 MED ORDER — PHENTERMINE HCL 37.5 MG PO CAPS
37.5000 mg | ORAL_CAPSULE | ORAL | 0 refills | Status: DC
  Filled 2024-10-23: qty 30, 30d supply, fill #0

## 2024-10-23 MED ORDER — BUDESONIDE-FORMOTEROL FUMARATE 80-4.5 MCG/ACT IN AERO: INHALATION_SPRAY | RESPIRATORY_TRACT | 3 refills | Status: AC

## 2024-10-24 ENCOUNTER — Encounter: Payer: Self-pay | Admitting: Family Medicine

## 2024-10-24 NOTE — Progress Notes (Signed)
 Established Patient Office Visit  Subjective    Patient ID: Barbara Adkins, female    DOB: 12-Jul-1951  Age: 73 y.o. MRN: [id number]  CC:  Chief Complaint  Patient presents with   Medical Management of Chronic Issues    HPI Barbara Adkins presents for routine follow up of chronic med issues including diabetes and hypertension. Patient reports med compliance ad denies acute complaint.  Outpatient Encounter Medications as of 10/23/2024  Medication Sig   albuterol  (VENTOLIN  HFA) 108 (90 Base) MCG/ACT inhaler Inhale 2 puffs into the lungs every 4 (four) hours.   amLODipine  (NORVASC ) 10 MG tablet TAKE 1 TABLET BY MOUTH DAILY   atorvastatin  (LIPITOR) 10 MG tablet Atorvastatin  Calcium    Blood Pressure Monitoring (BLOOD PRESSURE KIT) DEVI 1 Device by Does not apply route 3 (three) times a week. Use to check blood pressure three times a week. ICD10 I10   budesonide -formoterol  (SYMBICORT ) 80-4.5 MCG/ACT inhaler Symbicort    fluticasone  (FLONASE ) 50 MCG/ACT nasal spray Place 2 sprays into both nostrils daily.   hydrochlorothiazide  (HYDRODIURIL ) 25 MG tablet TAKE 1 TABLET BY MOUTH DAILY   loratadine  (CLARITIN ) 5 MG chewable tablet Claritin    losartan  (COZAAR ) 100 MG tablet TAKE 1 TABLET BY MOUTH EVERY DAY   metFORMIN  (GLUCOPHAGE ) 500 MG tablet TAKE 1 TABLET BY MOUTH DAILY  WITH BREAKFAST   traMADol  (ULTRAM ) 50 MG tablet Take 1 tablet (50 mg total) by mouth 2 (two) times daily as needed for severe pain (pain score 7-10).   [DISCONTINUED] phentermine  37.5 MG capsule Take 1 capsule (37.5 mg total) by mouth every morning.   acetaminophen  (TYLENOL ) 325 MG tablet Take 2 tablets (650 mg total) by mouth every 6 (six) hours. (Patient not taking: Reported on 10/23/2024)   atorvastatin  (LIPITOR) 20 MG tablet TAKE 1 TABLET BY MOUTH ONCE  DAILY (Patient not taking: Reported on 10/23/2024)   budesonide -formoterol  (SYMBICORT ) 80-4.5 MCG/ACT inhaler INHALE 2 INHALATIONS BY MOUTH IN THE MORNING AND AT  BEDTIME   cetirizine  (ZYRTEC ) 10 MG tablet Take 1 tablet (10 mg total) by mouth daily. (Patient not taking: Reported on 10/23/2024)   ketorolac  (ACULAR ) 0.5 % ophthalmic solution Place 1 drop into the left eye 4 (four) times daily.   latanoprost  (XALATAN ) 0.005 % ophthalmic solution Place 1 drop into both eyes nightly.   latanoprost  (XALATAN ) 0.005 % ophthalmic solution    meloxicam  (MOBIC ) 7.5 MG tablet TAKE 1 TABLET BY MOUTH DAILY (Patient not taking: Reported on 10/23/2024)   montelukast  (SINGULAIR ) 10 MG tablet TAKE 1 TABLET BY MOUTH AT  BEDTIME (Patient not taking: Reported on 10/23/2024)   omeprazole  (PRILOSEC ) 20 MG capsule Take 1 capsule (20 mg total) by mouth daily.   phentermine  37.5 MG capsule Take 1 capsule (37.5 mg total) by mouth every morning.   prednisoLONE  acetate (PRED FORTE ) 1 % ophthalmic suspension Place 1 drop into the left eye 4 (four) times daily. Start using day after surgery as instructed   predniSONE  (STERAPRED UNI-PAK 21 TAB) 10 MG (21) TBPK tablet TAKE PO DAILY AS DIRECTED (Patient not taking: Reported on 10/23/2024)   Timolol-Dorzolamid-Latanoprost  0.5-0.15-0.005 % SOLN Apply to eye.   [DISCONTINUED] SYMBICORT  80-4.5 MCG/ACT inhaler INHALE 2 INHALATIONS BY MOUTH IN THE MORNING AND AT BEDTIME   No facility-administered encounter medications on file as of 10/23/2024.    Past Medical History:  Diagnosis Date   Allergy    Arthritis    Deviated septum    Diabetes mellitus without complication (HCC)    GERD (gastroesophageal reflux  disease)    Hypertension     Past Surgical History:  Procedure Laterality Date   ETHMOIDECTOMY Right 05/17/2018   Procedure: RIGHT ETHMOIDECTOMY WITH TISSUE REMOVAL;  Surgeon: Karis Clunes, MD;  Location: [facility];  Service: ENT;  Laterality: Right;   FRONTAL SINUS EXPLORATION Right 05/17/2018   Procedure: RIGHT FRONTAL RECESS EXPLORATION;  Surgeon: Karis Clunes, MD;  Location: [facility];  Service: ENT;  Laterality:  Right;   MAXILLARY ANTROSTOMY Right 05/17/2018   Procedure: RIGHT MAXILLARY ANTROSTOMY WITH TISSUE REMOVAL;  Surgeon: Karis Clunes, MD;  Location: [facility];  Service: ENT;  Laterality: Right;   NASAL SEPTOPLASTY W/ TURBINOPLASTY Bilateral 05/17/2018   Procedure: NASAL SEPTOPLASTY WITH TURBINATE REDUCTION;  Surgeon: Karis Clunes, MD;  Location: [facility];  Service: ENT;  Laterality: Bilateral;   SINUS ENDO WITH FUSION N/A 05/17/2018   Procedure: SINUS ENDO WITH FUSION;  Surgeon: Karis Clunes, MD;  Location: [facility];  Service: ENT;  Laterality: N/A;   SPHENOIDECTOMY Right 05/17/2018   Procedure: RIGHT SPHENOIDECTOMY WITH TISSUE REMOVAL;  Surgeon: Karis Clunes, MD;  Location: [facility];  Service: ENT;  Laterality: Right;   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   Procedure: TOTAL HIP ARTHROPLASTY;  Surgeon: Anderson Maude ORN, MD;  Location: [facility];  Service: Orthopedics;  Laterality: Left;   TUBAL LIGATION      Family History  Problem Relation Age of Onset   Asthma Other    Cancer Other    Hypertension Other    Diabetes Other    Colon cancer Maternal Grandmother    Breast cancer Neg Hx    Esophageal cancer Neg Hx    Rectal cancer Neg Hx    Stomach cancer Neg Hx     Social History   Socioeconomic History   Marital status: Widowed    Spouse name: Not on file   Number of children: Not on file   Years of education: Not on file   Highest education level: Some college, no degree  Occupational History   Not on file  Tobacco Use   Smoking status: Never   Smokeless tobacco: Never  Vaping Use   Vaping status: Never Used  Substance and Sexual Activity   Alcohol use: No   Drug use: Never   Sexual activity: Not on file  Other Topics Concern   Not on file  Social History Narrative   Not on file   Social Drivers of Health   Financial Resource Strain: Medium Risk (10/22/2024)   Overall Financial Resource Strain (CARDIA)    Difficulty of  Paying Living Expenses: Somewhat hard  Food Insecurity: Food Insecurity Present (10/22/2024)   Hunger Vital Sign    Worried About Running Out of Food in the Last Year: Sometimes true    Ran Out of Food in the Last Year: Patient declined  Transportation Needs: No Transportation Needs (10/22/2024)   PRAPARE - Administrator, Civil Service (Medical): No    Lack of Transportation (Non-Medical): No  Physical Activity: Insufficiently Active (10/22/2024)   Exercise Vital Sign    Days of Exercise per Week: 3 days    Minutes of Exercise per Session: 20 min  Stress: No Stress Concern Present (10/22/2024)   [employer] of Occupational Health - Occupational Stress Questionnaire    Feeling of Stress: Not at all  Social Connections: Moderately Integrated (10/22/2024)   Social Connection and Isolation Panel    Frequency of Communication  with Friends and Family: More than three times a week    Frequency of Social Gatherings with Friends and Family: More than three times a week    Attends Religious Services: More than 4 times per year    Active Member of [employer] or Organizations: Yes    Attends Banker Meetings: More than 4 times per year    Marital Status: Widowed  Intimate Partner Violence: Unknown (02/23/2024)   Humiliation, Afraid, Rape, and Kick questionnaire    Fear of Current or Ex-Partner: No    Emotionally Abused: No    Physically Abused: Not on file    Sexually Abused: No    Review of Systems  All other systems reviewed and are negative.       Objective    BP 110/71   Pulse 67   Ht 5' 2.5 (1.588 m)   Wt 154 lb 12.8 oz (70.2 kg)   SpO2 96%   BMI 27.86 kg/m   Physical Exam Vitals and nursing note reviewed.  Constitutional:      General: She is not in acute distress. Cardiovascular:     Rate and Rhythm: Normal rate and regular rhythm.  Pulmonary:     Effort: Pulmonary effort is normal.     Breath sounds: Normal breath sounds.  Abdominal:      Palpations: Abdomen is soft.     Tenderness: There is no abdominal tenderness.  Neurological:     General: No focal deficit present.     Mental Status: She is alert and oriented to person, place, and time.         Assessment & Plan:   Mild persistent reactive airway disease without complication  Overweight (BMI 25.0-29.9)  Essential hypertension  Prediabetes  Hyperlipidemia, unspecified hyperlipidemia type  Encounter for immunization -     Flu vaccine HIGH DOSE PF(Fluzone Trivalent)  Other orders -     Budesonide -Formoterol  Fumarate; INHALE 2 INHALATIONS BY MOUTH IN THE MORNING AND AT BEDTIME  Dispense: 30.6 g; Refill: 3 -     Phentermine  HCl; Take 1 capsule (37.5 mg total) by mouth every morning.  Dispense: 30 capsule; Refill: 0     No follow-ups on file.   Tanda Raguel SQUIBB, MD

## 2024-11-01 ENCOUNTER — Other Ambulatory Visit (HOSPITAL_COMMUNITY): Payer: Self-pay

## 2024-12-18 ENCOUNTER — Ambulatory Visit: Admitting: Family Medicine

## 2024-12-18 ENCOUNTER — Encounter: Payer: Self-pay | Admitting: Family Medicine

## 2024-12-18 VITALS — BP 94/52 | HR 73 | Ht 63.0 in | Wt 162.4 lb

## 2024-12-18 DIAGNOSIS — R7303 Prediabetes: Secondary | ICD-10-CM | POA: Diagnosis not present

## 2024-12-18 DIAGNOSIS — Z6828 Body mass index (BMI) 28.0-28.9, adult: Secondary | ICD-10-CM | POA: Diagnosis not present

## 2024-12-18 DIAGNOSIS — E785 Hyperlipidemia, unspecified: Secondary | ICD-10-CM | POA: Diagnosis not present

## 2024-12-18 DIAGNOSIS — I10 Essential (primary) hypertension: Secondary | ICD-10-CM

## 2024-12-18 DIAGNOSIS — E663 Overweight: Secondary | ICD-10-CM | POA: Diagnosis not present

## 2024-12-18 DIAGNOSIS — J4530 Mild persistent asthma, uncomplicated: Secondary | ICD-10-CM | POA: Diagnosis not present

## 2024-12-18 DIAGNOSIS — Z7689 Persons encountering health services in other specified circumstances: Secondary | ICD-10-CM

## 2024-12-18 MED ORDER — MELOXICAM 7.5 MG PO TABS: 7.5000 mg | ORAL_TABLET | Freq: Every day | ORAL | 0 refills | Status: AC

## 2024-12-18 MED ORDER — PHENTERMINE HCL 37.5 MG PO CAPS: 37.5000 mg | ORAL_CAPSULE | ORAL | 0 refills | Status: AC

## 2024-12-18 MED ORDER — FLUTICASONE-SALMETEROL 500-50 MCG/ACT IN AEPB: 1.0000 | INHALATION_SPRAY | Freq: Two times a day (BID) | RESPIRATORY_TRACT | 5 refills | Status: AC

## 2024-12-19 ENCOUNTER — Encounter: Payer: Self-pay | Admitting: Family Medicine

## 2025-02-04 ENCOUNTER — Ambulatory Visit: Payer: Self-pay | Admitting: Family Medicine

## 2025-03-07 ENCOUNTER — Ambulatory Visit
